# Patient Record
Sex: Male | Born: 1960 | Race: Black or African American | Hispanic: No | Marital: Married | Smoking: Never smoker
Health system: Southern US, Community
[De-identification: ages and names within clinical notes are randomized; demographics above are authoritative.]

## PROBLEM LIST (undated history)

## (undated) DIAGNOSIS — K209 Esophagitis, unspecified without bleeding: Secondary | ICD-10-CM

## (undated) DIAGNOSIS — K2970 Gastritis, unspecified, without bleeding: Secondary | ICD-10-CM

## (undated) DIAGNOSIS — K219 Gastro-esophageal reflux disease without esophagitis: Secondary | ICD-10-CM

## (undated) DIAGNOSIS — I10 Essential (primary) hypertension: Secondary | ICD-10-CM

## (undated) DIAGNOSIS — K648 Other hemorrhoids: Principal | ICD-10-CM

## (undated) DIAGNOSIS — T7840XA Allergy, unspecified, initial encounter: Secondary | ICD-10-CM

## (undated) DIAGNOSIS — K635 Polyp of colon: Secondary | ICD-10-CM

## (undated) HISTORY — DX: Esophagitis, unspecified: K20.9

## (undated) HISTORY — PX: ESOPHAGOGASTRODUODENOSCOPY: SHX1529

## (undated) HISTORY — DX: Esophagitis, unspecified without bleeding: K20.90

## (undated) HISTORY — PX: HEMORRHOID BANDING: SHX5850

## (undated) HISTORY — DX: Gastro-esophageal reflux disease without esophagitis: K21.9

## (undated) HISTORY — DX: Allergy, unspecified, initial encounter: T78.40XA

## (undated) HISTORY — DX: Gastritis, unspecified, without bleeding: K29.70

## (undated) HISTORY — PX: COLONOSCOPY: SHX5424

## (undated) HISTORY — DX: Other hemorrhoids: K64.8

## (undated) HISTORY — DX: Polyp of colon: K63.5

---

## 2005-08-05 ENCOUNTER — Emergency Department (HOSPITAL_COMMUNITY): Admission: EM | Admit: 2005-08-05 | Discharge: 2005-08-05 | Payer: Self-pay | Admitting: Emergency Medicine

## 2006-01-09 ENCOUNTER — Encounter: Admission: RE | Admit: 2006-01-09 | Discharge: 2006-01-09 | Payer: Self-pay | Admitting: Emergency Medicine

## 2007-06-03 HISTORY — PX: WISDOM TOOTH EXTRACTION: SHX21

## 2008-03-16 ENCOUNTER — Encounter: Admission: RE | Admit: 2008-03-16 | Discharge: 2008-03-16 | Payer: Self-pay | Admitting: Emergency Medicine

## 2009-10-16 ENCOUNTER — Emergency Department (HOSPITAL_COMMUNITY): Admission: EM | Admit: 2009-10-16 | Discharge: 2009-10-16 | Payer: Self-pay | Admitting: Emergency Medicine

## 2009-11-03 ENCOUNTER — Encounter: Admission: RE | Admit: 2009-11-03 | Discharge: 2009-11-03 | Payer: Self-pay | Admitting: Neurological Surgery

## 2010-03-28 ENCOUNTER — Encounter: Admission: RE | Admit: 2010-03-28 | Discharge: 2010-03-28 | Payer: Self-pay | Admitting: Internal Medicine

## 2012-02-19 ENCOUNTER — Emergency Department (INDEPENDENT_AMBULATORY_CARE_PROVIDER_SITE_OTHER)
Admission: EM | Admit: 2012-02-19 | Discharge: 2012-02-19 | Disposition: A | Discharge status: Home / Self Care | Payer: Self-pay | Source: Home / Self Care | Attending: Emergency Medicine | Admitting: Emergency Medicine

## 2012-02-19 ENCOUNTER — Encounter (HOSPITAL_COMMUNITY): Payer: Self-pay | Admitting: Emergency Medicine

## 2012-02-19 DIAGNOSIS — N481 Balanitis: Secondary | ICD-10-CM

## 2012-02-19 DIAGNOSIS — N476 Balanoposthitis: Secondary | ICD-10-CM

## 2012-02-19 HISTORY — DX: Essential (primary) hypertension: I10

## 2012-02-19 MED ORDER — CLOTRIMAZOLE 1 % EX CREA: TOPICAL_CREAM | CUTANEOUS | Status: DC

## 2012-02-19 NOTE — ED Notes (Addendum)
Pt c/o irritation in groin area since Tuesday. Pt states that he used a different show gel at the gym. Denies any other symptoms. Needs rx refill on acid reflux med, cant remember name of med.

## 2012-02-19 NOTE — ED Provider Notes (Signed)
History     CSN: [id number]  Arrival date & time 02/19/12  0915   None     Chief Complaint  Patient presents with  . Rash    groin irritation    (Consider location/radiation/quality/duration/timing/severity/associated sxs/prior treatment) The history is provided by the patient.   This patient complains of a pruritic rash.  Location: penis Onset: 2 days ago   Course: unchanged Self-treated with:  nothing     Improvement with treatment: n/a   History Itching: yes  Tenderness: no  New medications/antibiotics: no  Pet exposure: no  Recent travel or tropical exposure: no  New soaps, shampoos, detergent, clothing: no Tick/insect exposure: no   Red Flags Feeling ill: no Fever:no Facial/tongue swelling/difficulty breathing:  no  Diabetic or immunocompromised: no   Past Medical History  Diagnosis Date  . Hypertension     History reviewed. No pertinent past surgical history.  Family History  Problem Relation Age of Onset  . Hypertension Other     History  Substance Use Topics  . Smoking status: Never Smoker   . Smokeless tobacco: Not on file  . Alcohol Use: No      Review of Systems  Constitutional: Negative.   Respiratory: Negative.   Cardiovascular: Negative.   Genitourinary: Negative.   Skin: Positive for rash.    Allergies  Review of patient's allergies indicates no known allergies.  Home Medications   Current Outpatient Rx  Name Route Sig Dispense Refill  . HYDROCHLOROTHIAZIDE 12.5 MG PO CAPS Oral Take 12.5 mg by mouth daily.    Marland Kitchen CLOTRIMAZOLE 1 % EX CREA  Apply to affected area 2 times daily 30 g 0    BP 124/80  Pulse 81  Temp 98.1 F (36.7 C) (Oral)  Resp 16  SpO2 100%  Physical Exam  Nursing note and vitals reviewed. Constitutional: He is oriented to person, place, and time. Vital signs are normal. He appears well-developed and well-nourished. He is active and cooperative.  HENT:  Head: Normocephalic.  Eyes: Conjunctivae  normal are normal. Pupils are equal, round, and reactive to light. No scleral icterus.  Neck: Trachea normal. Neck supple.  Cardiovascular: Normal rate, regular rhythm and normal heart sounds.   Pulmonary/Chest: Effort normal and breath sounds normal.  Genitourinary:    Right testis shows no mass, no swelling and no tenderness. Right testis is descended. Cremasteric reflex is not absent on the right side. Left testis shows no mass, no swelling and no tenderness. Left testis is descended. Cremasteric reflex is not absent on the left side. Uncircumcised. Penile erythema present. No phimosis, paraphimosis, hypospadias or penile tenderness. No discharge found.       Erythematous rash to glans  Lymphadenopathy:       Right: No inguinal adenopathy present.       Left: No inguinal adenopathy present.  Neurological: He is alert and oriented to person, place, and time. No cranial nerve deficit or sensory deficit.  Skin: Skin is warm and dry.  Psychiatric: He has a normal mood and affect. His speech is normal and behavior is normal. Judgment and thought content normal. Cognition and memory are normal.    ED Course  Procedures (including critical care time)   Labs Reviewed  GC/CHLAMYDIA PROBE AMP, URINE   No results found.   1. Balanitis       MDM  Await GC results, absence of penile discharge, do not suspect infection.  Begin medications as prescribed.       Mario Coronado L  Dhruvan Gullion, NP 02/19/12 1051  Johnsie Kindred, NP 02/19/12 1053

## 2012-02-20 NOTE — ED Provider Notes (Signed)
Medical screening examination/treatment/procedure(s) were performed by non-physician practitioner and as supervising physician I was immediately available for consultation/collaboration.  Luiz Blare MD   Luiz Blare, MD 02/20/12 [phone]

## 2012-03-18 ENCOUNTER — Telehealth (HOSPITAL_COMMUNITY): Payer: Self-pay | Admitting: *Deleted

## 2012-03-18 NOTE — ED Notes (Signed)
Pt. called on VM 10/11 and asked for urine result from Sept. I called pt. back and told him Urine GC/Chlamydia was cancelled. I did not know why. I said they may not have received a sample.  Pt. denies any further symptoms. I told him if he does he needs to get rechecked. Pt. voiced understanding. Vassie Moselle 03/18/2012

## 2013-02-21 ENCOUNTER — Ambulatory Visit (INDEPENDENT_AMBULATORY_CARE_PROVIDER_SITE_OTHER): Payer: Managed Care, Other (non HMO) | Admitting: Physician Assistant

## 2013-02-21 ENCOUNTER — Other Ambulatory Visit: Payer: Self-pay | Admitting: Physician Assistant

## 2013-02-21 ENCOUNTER — Encounter: Payer: Self-pay | Admitting: Physician Assistant

## 2013-02-21 VITALS — BP 122/86 | HR 67 | Temp 98.8°F | Resp 16 | Ht 64.5 in | Wt 156.6 lb

## 2013-02-21 DIAGNOSIS — J9801 Acute bronchospasm: Secondary | ICD-10-CM

## 2013-02-21 DIAGNOSIS — I10 Essential (primary) hypertension: Secondary | ICD-10-CM

## 2013-02-21 DIAGNOSIS — Z0000 Encounter for general adult medical examination without abnormal findings: Secondary | ICD-10-CM

## 2013-02-21 DIAGNOSIS — R17 Unspecified jaundice: Secondary | ICD-10-CM

## 2013-02-21 DIAGNOSIS — M62838 Other muscle spasm: Secondary | ICD-10-CM

## 2013-02-21 DIAGNOSIS — K219 Gastro-esophageal reflux disease without esophagitis: Secondary | ICD-10-CM

## 2013-02-21 LAB — POCT URINALYSIS DIPSTICK
Bilirubin, UA: NEGATIVE
Blood, UA: NEGATIVE
Glucose, UA: NEGATIVE
Ketones, UA: NEGATIVE
Leukocytes, UA: NEGATIVE
Nitrite, UA: NEGATIVE
Protein, UA: NEGATIVE
Spec Grav, UA: <=1.005
Urobilinogen, UA: 0.2
pH, UA: 7

## 2013-02-21 LAB — POCT UA - MICROSCOPIC ONLY
Bacteria, U Microscopic: NEGATIVE
Casts, Ur, LPF, POC: NEGATIVE
Crystals, Ur, HPF, POC: NEGATIVE
RBC, urine, microscopic: NEGATIVE
WBC, Ur, HPF, POC: NEGATIVE
Yeast, UA: NEGATIVE

## 2013-02-21 LAB — CBC
HCT: 44.9 % (ref 39.0–52.0)
Hemoglobin: 15.5 g/dL (ref 13.0–17.0)
MCH: 30.3 pg (ref 26.0–34.0)
MCHC: 34.5 g/dL (ref 30.0–36.0)
MCV: 87.9 fL (ref 78.0–100.0)
Platelets: 204 10*3/uL (ref 150–400)
RBC: 5.11 MIL/uL (ref 4.22–5.81)
RDW: 14.3 % (ref 11.5–15.5)
WBC: 4.6 10*3/uL (ref 4.0–10.5)

## 2013-02-21 LAB — IFOBT (OCCULT BLOOD): IFOBT: NEGATIVE

## 2013-02-21 MED ORDER — CYCLOBENZAPRINE HCL 5 MG PO TABS: 5.0000 mg | ORAL_TABLET | Freq: Three times a day (TID) | ORAL | Status: DC | PRN

## 2013-02-21 MED ORDER — HYDROCHLOROTHIAZIDE 12.5 MG PO CAPS: 12.5000 mg | ORAL_CAPSULE | ORAL | Status: DC

## 2013-02-21 MED ORDER — ALBUTEROL SULFATE HFA 108 (90 BASE) MCG/ACT IN AERS: 2.0000 | INHALATION_SPRAY | RESPIRATORY_TRACT | Status: DC | PRN

## 2013-02-21 MED ORDER — OMEPRAZOLE 20 MG PO CPDR: 20.0000 mg | DELAYED_RELEASE_CAPSULE | Freq: Every day | ORAL | Status: DC

## 2013-02-21 NOTE — Progress Notes (Signed)
  Subjective:    Patient ID: Justin Farley, male    DOB: [birth year], 52 y.o.   MRN: [id number]  HPI    Review of Systems  Constitutional: Negative.   HENT: Negative.   Eyes: Negative.   Respiratory: Positive for cough and chest tightness.   Cardiovascular: Negative.   Gastrointestinal: Positive for abdominal pain.  Endocrine: Negative.   Genitourinary: Negative.   Musculoskeletal: Negative.   Skin: Negative.   Allergic/Immunologic: Negative.   Neurological: Negative.   Hematological: Negative.   Psychiatric/Behavioral: Negative.        Objective:   Physical Exam        Assessment & Plan:

## 2013-02-22 ENCOUNTER — Encounter: Payer: Self-pay | Admitting: Physician Assistant

## 2013-02-22 LAB — COMPREHENSIVE METABOLIC PANEL
ALT: 20 U/L (ref 0–53)
AST: 24 U/L (ref 0–37)
Albumin: 4.7 g/dL (ref 3.5–5.2)
Alkaline Phosphatase: 24 U/L — ABNORMAL LOW (ref 39–117)
BUN: 10 mg/dL (ref 6–23)
CO2: 28 mEq/L (ref 19–32)
Calcium: 9.6 mg/dL (ref 8.4–10.5)
Chloride: 103 mEq/L (ref 96–112)
Creat: 1.06 mg/dL (ref 0.50–1.35)
Glucose, Bld: 80 mg/dL (ref 70–99)
Potassium: 3.7 mEq/L (ref 3.5–5.3)
Sodium: 139 mEq/L (ref 135–145)
Total Bilirubin: 1.8 mg/dL — ABNORMAL HIGH (ref 0.3–1.2)
Total Protein: 7.8 g/dL (ref 6.0–8.3)

## 2013-02-22 LAB — LIPID PANEL
Cholesterol: 134 mg/dL (ref 0–200)
HDL: 58 mg/dL (ref >39)
LDL Cholesterol: 62 mg/dL (ref 0–99)
Total CHOL/HDL Ratio: 2.3 Ratio
Triglycerides: 72 mg/dL (ref <150)
VLDL: 14 mg/dL (ref 0–40)

## 2013-02-22 LAB — BILIRUBIN,DIRECT & INDIRECT (FRACTIONATED)
Bilirubin, Direct: 0.4 mg/dL — ABNORMAL HIGH (ref 0.0–0.3)
Indirect Bilirubin: 1.4 mg/dL — ABNORMAL HIGH (ref 0.0–0.9)

## 2013-02-22 LAB — TSH: TSH: 2.31 u[IU]/mL (ref 0.350–4.500)

## 2013-02-22 LAB — PSA: PSA: 2.25 ng/mL (ref <=4.00)

## 2013-02-22 NOTE — Progress Notes (Signed)
Patient ID: Justin Farley MRN: [id number], DOB: Oct 08, 1960 52 y.o. Date of Encounter: 02/22/2013, 6:50 PM  Primary Physician: No primary provider on file.  Chief Complaint: Physical (CPE)  HPI: 52 y.o. male with history noted below here for CPE. Doing well. Recently lost to follow up and ran out of his HCTZ 12.5 mg secondary to being laid off at his job. 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 He does note some days some airway irritation secondary to all of the powder in the warehouse. To date he has not been wearing a mask while at work, however he does plan to change this in the very near future. He complains of an occasional dry cough and sometimes some chest tightness if he has had a bad day around a lot of powder at work. He also notes having to constantly look up during his shift and this is creating a lot of tension along his neck and the lower portion of his head at times. He feels like his neck is having spasms at times.   He also notes a return of his reflux secondary to being out of his omeprazole. He would like to restart his omeprazole. No increased belching, flatus, diarrhea, or constipation.   Review of Systems: Consitutional: No fever, chills, fatigue, night sweats, lymphadenopathy, or weight changes. Eyes: No visual changes, eye redness, or discharge. ENT/Mouth: Ears: No otalgia, tinnitus, hearing loss, discharge. Nose: No congestion, rhinorrhea, sinus pain, or epistaxis. Throat: No sore throat, post nasal drip, or teeth pain. Cardiovascular: No CP, palpitations, diaphoresis, DOE, edema, orthopnea, PND. Respiratory: Positive for cough and chest tightness. No hemoptysis, SOB, or wheezing. Gastrointestinal: Positive for reflux. No anorexia, dysphagia, pain, nausea, vomiting, hematemesis, diarrhea, constipation, BRBPR, or melena. Genitourinary: No dysuria, frequency, urgency, hematuria, incontinence, nocturia, decreased urinary  stream, discharge, impotence, or testicular pain/masses. Musculoskeletal: No decreased ROM, myalgias, stiffness, joint swelling, or weakness. Skin: No rash, erythema, lesion changes, pain, warmth, jaundice, or pruritis. Neurological: Positive for headache. No dizziness, syncope, seizures, tremors, memory loss, coordination problems, or paresthesias. Psychological: No anxiety, depression, hallucinations, SI/HI. Endocrine: No fatigue, polydipsia, polyphagia, polyuria, or known diabetes.   Past Medical History  Diagnosis Date  . Hypertension   . Reflux      History reviewed. No pertinent past surgical history.  Home Meds:  Prior to Admission medications   Medication Sig Start Date End Date Taking? Authorizing Provider         clotrimazole (LOTRIMIN) 1 % cream Apply to affected area 2 times daily 02/19/12  No Johnsie Kindred, NP         hydrochlorothiazide (MICROZIDE) 12.5 MG capsule Take 1 capsule (12.5 mg total) by mouth every morning. 02/21/13  No Coulter Oldaker M Vignesh Willert, PA-C  omeprazole (PRILOSEC) 20 MG capsule Take 1 capsule (20 mg total) by mouth daily. 02/21/13  No Sondra Barges, PA-C    Allergies: No Known Allergies  History   Social History  . Marital Status: Single    Spouse Name: N/A    Number of Children: N/A  . Years of Education: N/A   Occupational History  . Estate agent    Social History Main Topics  . Smoking status: Never Smoker   . Smokeless tobacco: Not on file  . Alcohol Use: No  . Drug Use: No  . Sexual Activity: Yes    Birth Control/ Protection: Condom   Other Topics Concern  . Not on file  Social History Narrative   Single. Education: [employer]. Exercise: 3 days a week for 2 hours.    Family History  Problem Relation Age of Onset  . Hypertension Other   . Hypertension Mother     Physical Exam: Blood pressure 122/86, pulse 67, temperature 98.8 F (37.1 C), temperature source Oral, resp. rate 16, height 5' 4.5" (1.638 m), weight 156 lb 9.6 oz  (71.033 kg), SpO2 100.00%.  General: Well developed, well nourished, in no acute distress. HEENT: Normocephalic, atraumatic. Conjunctiva pink, sclera non-icteric. Pupils 2 mm constricting to 1 mm, round, regular, and equally reactive to light and accomodation. EOMI. Internal auditory canal clear. TMs with good cone of light and without pathology. Nasal mucosa pink. Nares are without discharge. No sinus tenderness. Oral mucosa pink. Dentition normal. Pharynx without exudate.   Neck: Supple. Trachea midline. No thyromegaly. Full ROM. No lymphadenopathy. Lungs: Clear to auscultation bilaterally without wheezes, rales, or rhonchi. Breathing is of normal effort and unlabored. Cardiovascular: RRR with S1 S2. No murmurs, rubs, or gallops appreciated. Distal pulses 2+ symmetrically. No carotid or abdominal bruits.  Abdomen: Soft, non-tender, non-distended with normoactive bowel sounds. No hepatosplenomegaly or masses. No rebound/guarding. No CVA tenderness. Without hernias.  Rectal: No external hemorrhoids or fissures. Rectal vault without masses. Prostate smooth, symmetrical, and without nodules or TTP.   Genitourinary: Circumcised male. No penile lesions. Testes descended bilaterally, and smooth without tenderness or masses.  Musculoskeletal: Full range of motion and 5/5 strength throughout. Without swelling, atrophy, tenderness, crepitus, or warmth. Extremities without clubbing, cyanosis, or edema. Calves supple. Skin: Warm and moist without erythema, ecchymosis, wounds, or rash. Neuro: A+Ox3. CN II-XII grossly intact. Moves all extremities spontaneously. Full sensation throughout. Normal gait. DTR 2+ throughout upper and lower extremities. Finger to nose intact. Psych:  Responds to questions appropriately with a normal affect.   Studies:  Results for orders placed in visit on 02/21/13  CBC      Result Value Range   WBC 4.6  4.0 - 10.5 K/uL   RBC 5.11  4.22 - 5.81 MIL/uL   Hemoglobin 15.5  13.0 - 17.0  g/dL   HCT 16.1  09.6 - 04.5 %   MCV 87.9  78.0 - 100.0 fL   MCH 30.3  26.0 - 34.0 pg   MCHC 34.5  30.0 - 36.0 g/dL   RDW 40.9  81.1 - 91.4 %   Platelets 204  150 - 400 K/uL  COMPREHENSIVE METABOLIC PANEL      Result Value Range   Sodium 139  135 - 145 mEq/L   Potassium 3.7  3.5 - 5.3 mEq/L   Chloride 103  96 - 112 mEq/L   CO2 28  19 - 32 mEq/L   Glucose, Bld 80  70 - 99 mg/dL   BUN 10  6 - 23 mg/dL   Creat 7.82  9.56 - 2.13 mg/dL   Total Bilirubin 1.8 (*) 0.3 - 1.2 mg/dL   Alkaline Phosphatase 24 (*) 39 - 117 U/L   AST 24  0 - 37 U/L   ALT 20  0 - 53 U/L   Total Protein 7.8  6.0 - 8.3 g/dL   Albumin 4.7  3.5 - 5.2 g/dL   Calcium 9.6  8.4 - 08.6 mg/dL  LIPID PANEL      Result Value Range   Cholesterol 134  0 - 200 mg/dL   Triglycerides 72  <578 mg/dL   HDL 58  >46 mg/dL   Total CHOL/HDL Ratio  2.3     VLDL 14  0 - 40 mg/dL   LDL Cholesterol 62  0 - 99 mg/dL  PSA      Result Value Range   PSA 2.25  <=4.00 ng/mL  TSH      Result Value Range   TSH 2.310  0.350 - 4.500 uIU/mL  POCT UA - MICROSCOPIC ONLY      Result Value Range   WBC, Ur, HPF, POC neg     RBC, urine, microscopic neg     Bacteria, U Microscopic neg     Mucus, UA trace     Epithelial cells, urine per micros 0-2     Crystals, Ur, HPF, POC neg     Casts, Ur, LPF, POC neg     Yeast, UA neg    POCT URINALYSIS DIPSTICK      Result Value Range   Color, UA yellow     Clarity, UA clear     Glucose, UA neg     Bilirubin, UA neg     Ketones, UA neg     Spec Grav, UA <=1.005     Blood, UA neg     pH, UA 7.0     Protein, UA neg     Urobilinogen, UA 0.2     Nitrite, UA neg     Leukocytes, UA Negative    IFOBT (OCCULT BLOOD)      Result Value Range   IFOBT Negative       CBC, CMET, Lipid, PSA, TSH all pending. Patient is not fasting.   Assessment/Plan:  52 y.o. male here for CPE with inhalation irritation, hypertension, and tension headache.  1) Inhalation irritation -Proventil 2 puffs inhaled q  4-6 hours prn #1 RF 2 -Wear a mask at work  2) Hypertension -Restart HCTZ 12.5 mg 1 po daily #30 RF 11 -Healthy diet and exercise  -Weight loss  3) Tension headache -Flexeril 5 mg 1 po tid #90 RF 3, only take at night time  4) CPE -Will schedule screening colonoscopy during the first half of October 2014 once he turns 46 -Healthy lifestyle choices -Anticipatory guidance   Signed, Eula Listen, PA-C 02/22/2013 6:50 PM

## 2013-03-02 ENCOUNTER — Other Ambulatory Visit: Payer: Self-pay | Admitting: Physician Assistant

## 2013-03-02 DIAGNOSIS — Z1211 Encounter for screening for malignant neoplasm of colon: Secondary | ICD-10-CM

## 2013-03-02 NOTE — Progress Notes (Signed)
Please schedule patient for routine screening colonoscopy after his 52th birthday on 03/11/13.

## 2013-03-10 ENCOUNTER — Encounter: Payer: Self-pay | Admitting: Gastroenterology

## 2013-03-14 ENCOUNTER — Ambulatory Visit (INDEPENDENT_AMBULATORY_CARE_PROVIDER_SITE_OTHER): Payer: Managed Care, Other (non HMO) | Admitting: Physician Assistant

## 2013-03-14 ENCOUNTER — Encounter: Payer: Self-pay | Admitting: Physician Assistant

## 2013-03-14 VITALS — BP 115/70 | HR 73 | Temp 98.7°F | Resp 16 | Ht 64.0 in | Wt 157.0 lb

## 2013-03-14 DIAGNOSIS — R17 Unspecified jaundice: Secondary | ICD-10-CM

## 2013-03-14 DIAGNOSIS — Z23 Encounter for immunization: Secondary | ICD-10-CM

## 2013-03-14 LAB — COMPREHENSIVE METABOLIC PANEL
ALT: 18 U/L (ref 0–53)
AST: 21 U/L (ref 0–37)
Albumin: 4.4 g/dL (ref 3.5–5.2)
Alkaline Phosphatase: 30 U/L — ABNORMAL LOW (ref 39–117)
BUN: 13 mg/dL (ref 6–23)
CO2: 29 mEq/L (ref 19–32)
Calcium: 9.5 mg/dL (ref 8.4–10.5)
Chloride: 104 mEq/L (ref 96–112)
Creat: 1.28 mg/dL (ref 0.50–1.35)
Glucose, Bld: 96 mg/dL (ref 70–99)
Potassium: 3.6 mEq/L (ref 3.5–5.3)
Sodium: 139 mEq/L (ref 135–145)
Total Bilirubin: 0.8 mg/dL (ref 0.3–1.2)
Total Protein: 7.3 g/dL (ref 6.0–8.3)

## 2013-03-14 LAB — BILIRUBIN,DIRECT & INDIRECT (FRACTIONATED)
Bilirubin, Direct: 0.2 mg/dL (ref 0.0–0.3)
Indirect Bilirubin: 0.6 mg/dL (ref 0.0–0.9)

## 2013-03-14 NOTE — Progress Notes (Signed)
Patient ID: Justin Farley MRN: [id number], DOB: 1961/01/07, 52 y.o. Date of Encounter: 03/14/2013, 11:26 AM  Primary Physician: No primary provider on file.  Chief Complaint: Follow up elevated bilirubin  HPI: 52 y.o. male with history below presents for follow up of elevated bilirubin. Patient initially seen on 02/21/13 for CPE and was found to have a total bilirubin of 1.8. Indirect was 1.4 while direct was 0.4. He does not have a history of of elevated bilirubin. He was asked to come in for repeat testing. Asymptomatic. He does not drink alcohol nor does he use Tylenol. He does drink energy drinks, Red Bull, specifically. He has stopped drinking them since our CPE. Urine is normal in color.    Past Medical History  Diagnosis Date  . Hypertension   . Reflux      Home Meds: Prior to Admission medications   Medication Sig Start Date End Date Taking? Authorizing Provider  albuterol (PROVENTIL HFA;VENTOLIN HFA) 108 (90 BASE) MCG/ACT inhaler Inhale 2 puffs into the lungs every 4 (four) hours as needed for wheezing. 02/21/13   Sondra Barges, PA-C  clotrimazole (LOTRIMIN) 1 % cream Apply to affected area 2 times daily 02/19/12   Johnsie Kindred, NP  cyclobenzaprine (FLEXERIL) 5 MG tablet Take 1 tablet (5 mg total) by mouth 3 (three) times daily as needed for muscle spasms. 02/21/13   Sondra Barges, PA-C  hydrochlorothiazide (MICROZIDE) 12.5 MG capsule Take 1 capsule (12.5 mg total) by mouth every morning. 02/21/13   Sondra Barges, PA-C  omeprazole (PRILOSEC) 20 MG capsule Take 1 capsule (20 mg total) by mouth daily. 02/21/13   Sondra Barges, PA-C    Allergies: No Known Allergies  History   Social History  . Marital Status: Single    Spouse Name: N/A    Number of Children: N/A  . Years of Education: N/A   Occupational History  . Estate agent    Social History Main Topics  . Smoking status: Never Smoker   . Smokeless tobacco: Not on file  . Alcohol Use: No  . Drug Use: No  .  Sexual Activity: Yes    Birth Control/ Protection: Condom   Other Topics Concern  . Not on file   Social History Narrative   Single. Education: [employer]. Exercise: 3 days a week for 2 hours.     Review of Systems: Constitutional: negative for chills, fever, or fatigue  HEENT: negative for vision changes or hearing loss Cardiovascular: negative for chest pain or palpitations Respiratory: negative for wheezing, shortness of breath, or cough Abdominal: negative for abdominal pain, nausea, vomiting, diarrhea, or constipation Dermatological: negative for rash Neurologic: negative for headache  Physical Exam: There were no vitals taken for this visit., There is no weight on file to calculate BMI. General: Well developed, well nourished, in no acute distress. Head: Normocephalic, atraumatic, eyes without discharge, sclera non-icteric, nares are without discharge.   Neck: Supple. Full ROM.  Lungs: Clear bilaterally to auscultation without wheezes, rales, or rhonchi. Breathing is unlabored. Heart: RRR with S1 S2. No murmurs, rubs, or gallops appreciated. Abdomen: Soft, non-tender, non-distended with normoactive bowel sounds. No hepatosplenomegaly. No rebound/guarding. No obvious abdominal masses. Msk:  Strength and tone normal for age. Extremities/Skin: Warm and dry. No clubbing or cyanosis. No edema. No rashes or suspicious lesions. Neuro: Alert and oriented X 3. Moves all extremities spontaneously. Gait is normal. CNII-XII grossly in tact. Psych:  Responds to questions appropriately with a normal affect.  Labs: CMP pending Direct and indirect bilirubin pending  ASSESSMENT AND PLAN:  52 y.o. male with elevated bilirubin -Await labs -Follow up pending -Colonoscopy is scheduled for December 2014   Signed, Eula Listen, [facility] [place], [place] [address] [phone] 03/14/2013 11:26 AM

## 2013-04-19 ENCOUNTER — Ambulatory Visit (INDEPENDENT_AMBULATORY_CARE_PROVIDER_SITE_OTHER): Payer: Managed Care, Other (non HMO) | Admitting: Physician Assistant

## 2013-04-19 VITALS — BP 104/74 | HR 70 | Temp 99.1°F | Resp 18 | Ht 64.5 in | Wt 156.4 lb

## 2013-04-19 DIAGNOSIS — K219 Gastro-esophageal reflux disease without esophagitis: Secondary | ICD-10-CM

## 2013-04-19 DIAGNOSIS — I10 Essential (primary) hypertension: Secondary | ICD-10-CM | POA: Insufficient documentation

## 2013-04-19 MED ORDER — OMEPRAZOLE 40 MG PO CPDR: 40.0000 mg | DELAYED_RELEASE_CAPSULE | Freq: Every day | ORAL | Status: DC

## 2013-04-19 MED ORDER — RANITIDINE HCL 300 MG PO TABS: 300.0000 mg | ORAL_TABLET | Freq: Every day | ORAL | Status: DC

## 2013-04-19 NOTE — Progress Notes (Signed)
.     Subjective:    Patient ID: Justin Farley, male    DOB: 1960/12/29, 52 y.o.   MRN: [id number]  HPI Pt presents to clinic with upper abdominal pain that has worsened over the last month - this feels like when he was put on Nexium.  He was changed to Prilosec due to insurance coverage but was doing well on Prilosec.  He knows that spicy foods make his pain worse.  He does not have nighttime pain.  He drinks coffee but that does not bother him much.  He takes no additional meds for his symptoms  Review of Systems  Gastrointestinal: Positive for abdominal pain. Negative for nausea, vomiting, diarrhea, constipation and blood in stool.       No increase in burping       Objective:   Physical Exam  Vitals reviewed. Constitutional: He is oriented to person, place, and time. He appears well-developed and well-nourished.  HENT:  Head: Normocephalic and atraumatic.  Right Ear: External ear normal.  Left Ear: External ear normal.  Eyes: Conjunctivae are normal.  Neck: Normal range of motion.  Cardiovascular: Normal rate, regular rhythm and normal heart sounds.   No murmur heard. Pulmonary/Chest: Effort normal and breath sounds normal.  Abdominal: Soft. There is no tenderness.  Neurological: He is alert and oriented to person, place, and time.  Skin: Skin is warm and dry.  Psychiatric: He has a normal mood and affect. His behavior is normal. Judgment and thought content normal.       Assessment & Plan:  GERD (gastroesophageal reflux disease) - Plan: omeprazole (PRILOSEC) 40 MG capsule, ranitidine (ZANTAC) 300 MG tablet, H. pylori antibody, IgG  If h pylori is + will treat accordingly - he will use the Zantac for 2 weeks to help calm down the irritation and then he will use prn after eating spicy foods.  Benny Lennert PA-C 04/19/2013 9:50 AM

## 2013-04-20 LAB — H. PYLORI ANTIBODY, IGG: H Pylori IgG: 0.47 {ISR}

## 2013-04-22 ENCOUNTER — Telehealth: Payer: Self-pay

## 2013-04-22 NOTE — Telephone Encounter (Signed)
Letter typed, printed, signed, and at the TL desk.

## 2013-04-22 NOTE — Telephone Encounter (Signed)
Patient of Eula Listen. Says he needs a clearance letter so he can work out. He has HTN and is on a low dose BP medicine and needs ok from PCP to work out at [employer]. CB# [phone]

## 2013-04-23 NOTE — Telephone Encounter (Signed)
Pt notified that letter is ready for pick up

## 2013-04-25 ENCOUNTER — Ambulatory Visit (AMBULATORY_SURGERY_CENTER): Payer: Self-pay | Admitting: *Deleted

## 2013-04-25 VITALS — Ht 64.0 in | Wt 161.0 lb

## 2013-04-25 DIAGNOSIS — Z1211 Encounter for screening for malignant neoplasm of colon: Secondary | ICD-10-CM

## 2013-04-25 MED ORDER — MOVIPREP 100 G PO SOLR: ORAL | Status: DC

## 2013-04-25 NOTE — Progress Notes (Signed)
No allergies to eggs or soy. No problems with anesthesia.  

## 2013-04-27 ENCOUNTER — Encounter: Payer: Self-pay | Admitting: Gastroenterology

## 2013-05-10 ENCOUNTER — Encounter: Payer: Self-pay | Admitting: Gastroenterology

## 2013-05-10 ENCOUNTER — Ambulatory Visit (AMBULATORY_SURGERY_CENTER): Payer: Managed Care, Other (non HMO) | Admitting: Gastroenterology

## 2013-05-10 VITALS — BP 113/71 | HR 83 | Temp 97.8°F | Resp 10 | Ht 64.0 in | Wt 161.0 lb

## 2013-05-10 DIAGNOSIS — Z1211 Encounter for screening for malignant neoplasm of colon: Secondary | ICD-10-CM

## 2013-05-10 MED ORDER — SODIUM CHLORIDE 0.9 % IV SOLN: 500.0000 mL | INTRAVENOUS | Status: DC

## 2013-05-10 NOTE — Op Note (Signed)
[facility] [address] [place], [address]   COLONOSCOPY PROCEDURE REPORT  PATIENT: Justin Farley, Justin Farley  MR#: [id number] BIRTHDATE: [birth year] , 52  yrs. old GENDER: Male ENDOSCOPIST: [name] Fee, MD REFERRED BY: Eula Listen, MD PROCEDURE DATE:  05/10/2013 PROCEDURE:   Colonoscopy, screening First Screening Colonoscopy - Avg.  risk and is 50 yrs.  old or older Yes.  Prior Negative Screening - Now for repeat screening. N/A  History of Adenoma - Now for follow-up colonoscopy & has been > or = to 3 yrs.  N/A  Polyps Removed Today? No.  Recommend repeat exam, <10 yrs? No. ASA CLASS:   Class II INDICATIONS:average risk screening. MEDICATIONS: Fentanyl 50 mcg IV, Versed 5 mg IV, and These medications were titrated to patient response per physician's verbal order  DESCRIPTION OF PROCEDURE:   After the risks benefits and alternatives of the procedure were thoroughly explained, informed consent was obtained.  A digital rectal exam revealed no abnormalities of the rectum.   The LB [id number]  endoscope was introduced through the anus and advanced to the cecum, which was identified by both the appendix and ileocecal valve. No adverse events experienced.   The quality of the prep was excellent.  The instrument was then slowly withdrawn as the colon was fully examined.    COLON FINDINGS: A normal appearing cecum, ileocecal valve, and appendiceal orifice were identified.  The ascending, hepatic flexure, transverse, splenic flexure, descending, sigmoid colon and rectum appeared unremarkable.  No polyps or cancers were seen. Retroflexed views revealed no abnormalities. The time to cecum=2 minutes 16 seconds.  Withdrawal time=11 minutes 09 seconds.  The scope was withdrawn and the procedure completed. COMPLICATIONS: There were no complications.  ENDOSCOPIC IMPRESSION: Normal colon No polyps or cancers  RECOMMENDATIONS: You should continue to follow  colorectal cancer screening guidelines for "routine risk" patients with a repeat colonoscopy in 10 years.    eSigned:  [name] Fee, MD 05/10/2013 10:13 AM

## 2013-05-10 NOTE — Progress Notes (Signed)
Patient did not have preoperative order for IV antibiotic SSI prophylaxis. (G8918)  Patient did not experience any of the following events: a burn prior to discharge; a fall within the facility; wrong site/side/patient/procedure/implant event; or a hospital transfer or hospital admission upon discharge from the facility. (G8907)  

## 2013-05-10 NOTE — Patient Instructions (Signed)
YOU HAD AN ENDOSCOPIC PROCEDURE TODAY AT THE [facility]: Refer to the procedure report that was given to you for any specific questions about what was found during the examination.  If the procedure report does not answer your questions, please call your gastroenterologist to clarify.  If you requested that your care partner not be given the details of your procedure findings, then the procedure report has been included in a sealed envelope for you to review at your convenience later.  YOU SHOULD EXPECT: Some feelings of bloating in the abdomen. Passage of more gas than usual.  Walking can help get rid of the air that was put into your GI tract during the procedure and reduce the bloating. If you had a lower endoscopy (such as a colonoscopy or flexible sigmoidoscopy) you may notice spotting of blood in your stool or on the toilet paper. If you underwent a bowel prep for your procedure, then you may not have a normal bowel movement for a few days.  DIET: Your first meal following the procedure should be a light meal and then it is ok to progress to your normal diet.  A half-sandwich or bowl of soup is an example of a good first meal.  Heavy or fried foods are harder to digest and may make you feel nauseous or bloated.  Likewise meals heavy in dairy and vegetables can cause extra gas to form and this can also increase the bloating.  Drink plenty of fluids but you should avoid alcoholic beverages for 24 hours.  ACTIVITY: Your care partner should take you home directly after the procedure.  You should plan to take it easy, moving slowly for the rest of the day.  You can resume normal activity the day after the procedure however you should NOT DRIVE or use heavy machinery for 24 hours (because of the sedation medicines used during the test).    SYMPTOMS TO REPORT IMMEDIATELY: A gastroenterologist can be reached at any hour.  During normal business hours, 8:30 AM to 5:00 PM Monday through Friday,  call [phone].  After hours and on weekends, please call the GI answering service at [phone] who will take a message and have the physician on call contact you.   Following lower endoscopy (colonoscopy or flexible sigmoidoscopy):  Excessive amounts of blood in the stool  Significant tenderness or worsening of abdominal pains  Swelling of the abdomen that is new, acute  Fever of 100F or higher  FOLLOW UP: If any biopsies were taken you will be contacted by phone or by letter within the next 1-3 weeks.  Call your gastroenterologist if you have not heard about the biopsies in 3 weeks.  Our staff will call the home number listed on your records the next business day following your procedure to check on you and address any questions or concerns that you may have at that time regarding the information given to you following your procedure. This is a courtesy call and so if there is no answer at the home number and we have not heard from you through the emergency physician on call, we will assume that you have returned to your regular daily activities without incident.  SIGNATURES/CONFIDENTIALITY: You and/or your care partner have signed paperwork which will be entered into your electronic medical record.  These signatures attest to the fact that that the information above on your After Visit Summary has been reviewed and is understood.  Full responsibility of the confidentiality of this   discharge information lies with you and/or your care-partner.  Recommendations See procedure report  

## 2013-05-11 ENCOUNTER — Telehealth: Payer: Self-pay

## 2013-05-11 NOTE — Telephone Encounter (Signed)
  Follow up Call-  Call back number 05/10/2013  Post procedure Call Back phone  # [phone]  Permission to leave phone message Yes     Patient questions:  Do you have a fever, pain , or abdominal swelling? no Pain Score  0 *  Have you tolerated food without any problems? yes  Have you been able to return to your normal activities? yes  Do you have any questions about your discharge instructions: Diet   no Medications  no Follow up visit  no                     Do you have questions or concerns about your Care? No   Actions: * If pain score is 4 or above: No action needed, pain <4.

## 2013-05-29 ENCOUNTER — Ambulatory Visit (INDEPENDENT_AMBULATORY_CARE_PROVIDER_SITE_OTHER): Payer: Managed Care, Other (non HMO) | Admitting: Internal Medicine

## 2013-05-29 VITALS — BP 100/80 | HR 71 | Temp 98.9°F | Resp 18 | Wt 158.0 lb

## 2013-05-29 DIAGNOSIS — R1013 Epigastric pain: Secondary | ICD-10-CM

## 2013-05-29 LAB — POCT CBC
Granulocyte percent: 67.9 %G (ref 37–80)
HCT, POC: 51.1 % (ref 43.5–53.7)
Hemoglobin: 15.9 g/dL (ref 14.1–18.1)
Lymph, poc: 1.7 (ref 0.6–3.4)
MCH, POC: 29.7 pg (ref 27–31.2)
MCHC: 31.1 g/dL — AB (ref 31.8–35.4)
MCV: 95.4 fL (ref 80–97)
MID (cbc): 0.3 (ref 0–0.9)
MPV: 10.8 fL (ref 0–99.8)
POC Granulocyte: 4.2 (ref 2–6.9)
POC LYMPH PERCENT: 27.5 %L (ref 10–50)
POC MID %: 4.6 %M (ref 0–12)
Platelet Count, POC: 207 10*3/uL (ref 142–424)
RBC: 5.36 M/uL (ref 4.69–6.13)
RDW, POC: 14.7 %
WBC: 6.2 10*3/uL (ref 4.6–10.2)

## 2013-05-29 LAB — COMPREHENSIVE METABOLIC PANEL
ALT: 25 U/L (ref 0–53)
AST: 25 U/L (ref 0–37)
Albumin: 4.9 g/dL (ref 3.5–5.2)
Alkaline Phosphatase: 31 U/L — ABNORMAL LOW (ref 39–117)
BUN: 10 mg/dL (ref 6–23)
CO2: 31 mEq/L (ref 19–32)
Calcium: 9.7 mg/dL (ref 8.4–10.5)
Chloride: 99 mEq/L (ref 96–112)
Creat: 1.15 mg/dL (ref 0.50–1.35)
Glucose, Bld: 86 mg/dL (ref 70–99)
Potassium: 3.9 mEq/L (ref 3.5–5.3)
Sodium: 139 mEq/L (ref 135–145)
Total Bilirubin: 1.3 mg/dL — ABNORMAL HIGH (ref 0.3–1.2)
Total Protein: 8 g/dL (ref 6.0–8.3)

## 2013-05-29 LAB — AMYLASE: Amylase: 86 U/L (ref 0–105)

## 2013-05-29 LAB — POCT SEDIMENTATION RATE: POCT SED RATE: 11 mm/hr (ref 0–22)

## 2013-05-29 NOTE — Progress Notes (Addendum)
Subjective:    Patient ID: Justin Farley, male    DOB: 15-Oct-1960, 52 y.o.   MRN: [id number]  HPI This chart was scribed for [facility] by Smiley Houseman, Scribe. This patient was seen in room 9 and the patient's care was started at 2:48 PM.  HPI Comments: Justin Farley is a 52 y.o. male with a h/o of GERD who presents to the [facility] complaining of constant, moderate, "squeezing" epigastric abdominal pain onset about a month ago.  Pt denies diarrhea, nausea, constipation, and any weight loss.  Pt states the pain sometimes subsides after eating.  Pt denies any burning sensation when drinking coffee.  Pt was taken Nexium, but stopped due to the loss of his job.  Pt is currently taking Zantac without relief.  Pt recently had a colonoscopy that was normal.  He stated concern about pancreatic cancer.  Pt denies drinking alcohol.      Past Surgical History  Procedure Laterality Date  . Wisdom tooth extraction  2009    Family History  Problem Relation Age of Onset  . Hypertension Other   . Hypertension Mother   . Colon cancer Neg Hx     History   Social History  . Marital Status: Single    Spouse Name: N/A    Number of Children: N/A  . Years of Education: N/A   Occupational History  . Estate agent    Social History Main Topics  . Smoking status: Never Smoker   . Smokeless tobacco: Never Used  . Alcohol Use: No  . Drug Use: No  . Sexual Activity: Yes    Birth Control/ Protection: Condom   Other Topics Concern  . Not on file   Social History Narrative   Single. Education: [employer]. Exercise: 3 days a week for 2 hours.    No Known Allergies  Patient Active Problem List   Diagnosis Date Noted  . HTN (hypertension) 04/19/2013  . GERD (gastroesophageal reflux disease) 04/19/2013    Results for orders placed in visit on 04/19/13  H. PYLORI ANTIBODY, IGG      Result Value Range   H Pylori IgG 0.47      Review of Systems    Constitutional: Negative for fever and chills.  HENT: Negative for congestion and rhinorrhea.   Respiratory: Negative for cough and shortness of breath.   Cardiovascular: Negative for chest pain.  Gastrointestinal: Positive for abdominal pain. Negative for nausea, vomiting, diarrhea and constipation.  Musculoskeletal: Negative for back pain.  Skin: Negative for color change and rash.  Neurological: Negative for syncope.       Objective:   Physical Exam  Nursing note and vitals reviewed. Constitutional: He is oriented to person, place, and time. He appears well-developed and well-nourished. No distress.  HENT:  Head: Normocephalic and atraumatic.  Eyes: Conjunctivae and EOM are normal. Right eye exhibits no discharge. Left eye exhibits no discharge.  Neck: Normal range of motion.  Cardiovascular: Normal rate and regular rhythm.   Pulmonary/Chest: Effort normal and breath sounds normal. No respiratory distress.  Abdominal: Soft. Bowel sounds are normal. He exhibits no distension and no mass. There is no rebound and no guarding.  Mild epigastr tend  Musculoskeletal: Normal range of motion.  Neurological: He is alert and oriented to person, place, and time.  Skin: Skin is warm and dry.  Psychiatric: He has a normal mood and affect. His behavior is normal.    Triage Vitals: BP  100/80  Pulse 71  Temp(Src) 98.9 F (37.2 C) (Oral)  Resp 18  Wt 158 lb (71.668 kg)  SpO2 99%  DIAGNOSTIC STUDIES: Oxygen Saturation is 99% on RA, normal by my interpretation.    COORDINATION OF CARE: 2:54 PM-Patient informed of current plan of treatment and evaluation and agrees with plan.   Results for orders placed in visit on 05/29/13  POCT CBC      Result Value Range   WBC 6.2  4.6 - 10.2 K/uL   Lymph, poc 1.7  0.6 - 3.4   POC LYMPH PERCENT 27.5  10 - 50 %L   MID (cbc) 0.3  0 - 0.9   POC MID % 4.6  0 - 12 %M   POC Granulocyte 4.2  2 - 6.9   Granulocyte percent 67.9  37 - 80 %G   RBC 5.36   4.69 - 6.13 M/uL   Hemoglobin 15.9  14.1 - 18.1 g/dL   HCT, POC 16.1  09.6 - 53.7 %   MCV 95.4  80 - 97 fL   MCH, POC 29.7  27 - 31.2 pg   MCHC 31.1 (*) 31.8 - 35.4 g/dL   RDW, POC 04.5     Platelet Count, POC 207  142 - 424 K/uL   MPV 10.8  0 - 99.8 fL       Assessment & Plan:  I have completed the patient encounter in its entirety as documented by the scribe, with editing by me where necessary. Robert P. Merla Riches, M.D. Abdominal pain, epigastric - Plan: POCT CBC, POCT SEDIMENTATION RATE, Comprehensive metabolic panel, HELICOBACTER PYLORI  ANTIBODY, IGM, Amylase  Cont PPI for now

## 2013-06-02 NOTE — Progress Notes (Signed)
Sent Referral for GI consult. Pt aware.

## 2013-06-02 NOTE — Addendum Note (Signed)
Addended by: Jethro Bolus A on: 06/02/2013 09:19 AM   Modules accepted: Orders

## 2013-06-03 LAB — HELICOBACTER PYLORI  ANTIBODY, IGM: Helicobacter pylori, IgM: 11.3 U/mL — ABNORMAL HIGH (ref <9.0)

## 2013-06-17 ENCOUNTER — Encounter: Payer: Self-pay | Admitting: Gastroenterology

## 2013-07-13 ENCOUNTER — Ambulatory Visit (INDEPENDENT_AMBULATORY_CARE_PROVIDER_SITE_OTHER): Payer: Managed Care, Other (non HMO) | Admitting: Gastroenterology

## 2013-07-13 ENCOUNTER — Encounter: Payer: Self-pay | Admitting: Gastroenterology

## 2013-07-13 VITALS — BP 110/80 | HR 88 | Ht 65.0 in | Wt 162.0 lb

## 2013-07-13 DIAGNOSIS — R1013 Epigastric pain: Secondary | ICD-10-CM

## 2013-07-13 DIAGNOSIS — G8929 Other chronic pain: Secondary | ICD-10-CM

## 2013-07-13 DIAGNOSIS — K219 Gastro-esophageal reflux disease without esophagitis: Secondary | ICD-10-CM

## 2013-07-13 NOTE — Progress Notes (Signed)
HPI: This is a  very pleasant 53 year old man whom I last saw him for a screening colonoscopy that was normal. There was about 5 or 6 months ago.  The past 3 or 4 months he has had epigastric burning. It might be worse with eating but sometimes he does not even have to eat for his epigastrium to hurt. He has no dysphasia. He does not take NSAIDs. His weight is up a bit. He does not have any overt GI bleeding. No associated nausea or vomiting. He was put on Nexium and H2 blocker was added a month or so ago and he has noticed some relief but not complete. He has never had imaging before.  This visit was made more difficult by the fact that our computer system was down during the visit and I had 0 excess to his medical records.      Review of systems: Pertinent positive and negative review of systems were noted in the above HPI section. Complete review of systems was performed and was otherwise normal.    Past Medical History  Diagnosis Date  . Hypertension   . GERD (gastroesophageal reflux disease)     Past Surgical History  Procedure Laterality Date  . Wisdom tooth extraction  2009    Current Outpatient Prescriptions  Medication Sig Dispense Refill  . albuterol (PROVENTIL HFA;VENTOLIN HFA) 108 (90 BASE) MCG/ACT inhaler Inhale 2 puffs into the lungs every 4 (four) hours as needed for wheezing.  1 Inhaler  2  . cyclobenzaprine (FLEXERIL) 5 MG tablet Take 1 tablet (5 mg total) by mouth 3 (three) times daily as needed for muscle spasms.  90 tablet  3  . hydrochlorothiazide (MICROZIDE) 12.5 MG capsule Take 1 capsule (12.5 mg total) by mouth every morning.  30 capsule  11  . omeprazole (PRILOSEC) 40 MG capsule Take 1 capsule (40 mg total) by mouth daily.  30 capsule  1  . ranitidine (ZANTAC) 300 MG tablet Take 1 tablet (300 mg total) by mouth at bedtime. For 2 weeks and then prn  30 tablet  0   No current facility-administered medications for this visit.    Allergies as of 07/13/2013   . (No Known Allergies)    Family History  Problem Relation Age of Onset  . Hypertension Other   . Hypertension Mother   . Colon cancer Neg Hx     History   Social History  . Marital Status: Single    Spouse Name: N/A    Number of Children: N/A  . Years of Education: N/A   Occupational History  . Freight forwarder    Social History Main Topics  . Smoking status: Never Smoker   . Smokeless tobacco: Never Used  . Alcohol Use: No  . Drug Use: No  . Sexual Activity: Yes    Birth Control/ Protection: Condom   Other Topics Concern  . Not on file   Social History Narrative   Single. Education: [employer]. Exercise: 3 days a week for 2 hours.       Physical Exam: BP 110/80  Pulse 88  Ht 5\' 5"  (1.651 m)  Wt 162 lb (73.483 kg)  BMI 26.96 kg/m2 Constitutional: generally well-appearing Psychiatric: alert and oriented x3 Eyes: extraocular movements intact Mouth: oral pharynx moist, no lesions Neck: supple no lymphadenopathy Cardiovascular: heart regular rate and rhythm Lungs: clear to auscultation bilaterally Abdomen: soft, nontender, nondistended, no obvious ascites, no peritoneal signs, normal bowel sounds Extremities: no lower extremity edema bilaterally Skin:  no lesions on visible extremities    Assessment and plan: 53 y.o. male with  dyspepsia, epigastric discomfort, GERD-like symptoms  I recommended we proceed with EGD at his soonest convenience and he is going to adjust his take his proton pump inhibitor so it is 20-30 minutes before his supper meal.

## 2013-07-29 ENCOUNTER — Ambulatory Visit (AMBULATORY_SURGERY_CENTER): Payer: Self-pay | Admitting: *Deleted

## 2013-07-29 VITALS — Ht 65.0 in | Wt 163.8 lb

## 2013-07-29 DIAGNOSIS — G8929 Other chronic pain: Secondary | ICD-10-CM

## 2013-07-29 DIAGNOSIS — K219 Gastro-esophageal reflux disease without esophagitis: Secondary | ICD-10-CM

## 2013-07-29 DIAGNOSIS — R1013 Epigastric pain: Secondary | ICD-10-CM

## 2013-07-29 NOTE — Progress Notes (Signed)
No allergies to eggs or soy. No problems with anesthesia.  

## 2013-08-12 ENCOUNTER — Ambulatory Visit (AMBULATORY_SURGERY_CENTER): Payer: Managed Care, Other (non HMO) | Admitting: Gastroenterology

## 2013-08-12 ENCOUNTER — Encounter: Payer: Self-pay | Admitting: Gastroenterology

## 2013-08-12 VITALS — BP 110/76 | HR 65 | Temp 97.5°F | Resp 20 | Ht 65.0 in | Wt 163.0 lb

## 2013-08-12 DIAGNOSIS — K319 Disease of stomach and duodenum, unspecified: Secondary | ICD-10-CM

## 2013-08-12 DIAGNOSIS — K209 Esophagitis, unspecified without bleeding: Secondary | ICD-10-CM

## 2013-08-12 DIAGNOSIS — K2990 Gastroduodenitis, unspecified, without bleeding: Secondary | ICD-10-CM

## 2013-08-12 DIAGNOSIS — R933 Abnormal findings on diagnostic imaging of other parts of digestive tract: Secondary | ICD-10-CM

## 2013-08-12 DIAGNOSIS — R1013 Epigastric pain: Secondary | ICD-10-CM

## 2013-08-12 DIAGNOSIS — K2970 Gastritis, unspecified, without bleeding: Secondary | ICD-10-CM

## 2013-08-12 MED ORDER — SODIUM CHLORIDE 0.9 % IV SOLN: 500.0000 mL | INTRAVENOUS | Status: DC

## 2013-08-12 NOTE — Progress Notes (Signed)
Called to room to assist during endoscopic procedure.  Patient ID and intended procedure confirmed with present staff. Received instructions for my participation in the procedure from the performing physician.  

## 2013-08-12 NOTE — Patient Instructions (Signed)
YOU HAD AN ENDOSCOPIC PROCEDURE TODAY AT [facility]: Refer to the procedure report that was given to you for any specific questions about what was found during the examination.  If the procedure report does not answer your questions, please call your gastroenterologist to clarify.  If you requested that your care partner not be given the details of your procedure findings, then the procedure report has been included in a sealed envelope for you to review at your convenience later.  YOU SHOULD EXPECT: Some feelings of bloating in the abdomen. Passage of more gas than usual.  Walking can help get rid of the air that was put into your GI tract during the procedure and reduce the bloating  DIET: Your first meal following the procedure should be a light meal and then it is ok to progress to your normal diet.  A half-sandwich or bowl of soup is an example of a good first meal.  Heavy or fried foods are harder to digest and may make you feel nauseous or bloated.  Likewise meals heavy in dairy and vegetables can cause extra gas to form and this can also increase the bloating.  Drink plenty of fluids but you should avoid alcoholic beverages for 24 hours.  ACTIVITY: Your care partner should take you home directly after the procedure.  You should plan to take it easy, moving slowly for the rest of the day.  You can resume normal activity the day after the procedure however you should NOT DRIVE or use heavy machinery for 24 hours (because of the sedation medicines used during the test).    SYMPTOMS TO REPORT IMMEDIATELY: A gastroenterologist can be reached at any hour.  During normal business hours, 8:30 AM to 5:00 PM Monday through Friday, call [phone].  After hours and on weekends, please call the GI answering service at [phone] who will take a message and have the physician on call contact you.   Following upper endoscopy (EGD)  Vomiting of blood or coffee ground material  New  chest pain or pain under the shoulder blades  Painful or persistently difficult swallowing  New shortness of breath  Fever of 100F or higher  Black, tarry-looking stools  FOLLOW UP: If any biopsies were taken you will be contacted by phone or by letter within the next 1-3 weeks.  Call your gastroenterologist if you have not heard about the biopsies in 3 weeks.  Our staff will call the home number listed on your records the next business day following your procedure to check on you and address any questions or concerns that you may have at that time regarding the information given to you following your procedure. This is a courtesy call and so if there is no answer at the home number and we have not heard from you through the emergency physician on call, we will assume that you have returned to your regular daily activities without incident.  SIGNATURES/CONFIDENTIALITY: You and/or your care partner have signed paperwork which will be entered into your electronic medical record.  These signatures attest to the fact that that the information above on your After Visit Summary has been reviewed and is understood.  Full responsibility of the confidentiality of this discharge information lies with you and/or your care-partner.  Await pathology  Continue your normal medications  Continue on your antacids

## 2013-08-12 NOTE — Progress Notes (Signed)
Procedure ends, to recovery, report given and VSS. 

## 2013-08-12 NOTE — Op Note (Signed)
[facility]  [address] [place], [address]   ENDOSCOPY PROCEDURE REPORT  PATIENT: Justin, Farley  MR#: [id number] BIRTHDATE: Sep 04, 1960 , 52  yrs. old GENDER: Male ENDOSCOPIST: Milus Banister, MD REFERRED BY:  Christell Faith, MD PROCEDURE DATE:  08/12/2013 PROCEDURE:  EGD w/ biopsy ASA CLASS:     Class II INDICATIONS:  burning epigastric pain. MEDICATIONS: MAC sedation, administered by CRNA and propofol (Diprivan) 200mg  IV TOPICAL ANESTHETIC: none  DESCRIPTION OF PROCEDURE: After the risks benefits and alternatives of the procedure were thoroughly explained, informed consent was obtained.  The LB [id number] endoscope was introduced through the mouth and advanced to the second portion of the duodenum. Without limitations.  The instrument was slowly withdrawn as the mucosa was fully examined.   There was mild reflux related esophagitis.  There was mild, distal non-specifid gastritis (biopsied).  There was a1cm, clefted submucosal lesion that did not appear neoplastic but was biopsied in tunnel fashion.  The examination was otherwise normal. Retroflexed views revealed no abnormalities.     The scope was then withdrawn from the patient and the procedure completed. COMPLICATIONS: There were no complications.  ENDOSCOPIC IMPRESSION: There was mild reflux related esophagitis.  There was mild, distal non-specifid gastritis (biopsied).  There was a 1cm, clefted submucosal lesion that did not appear neoplastic but was biopsied in tunnel fashion.  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 The examination was otherwise normal.  RECOMMENDATIONS: Continue daily antiacid medicine.  Await final pathology results.   eSigned:  Milus Banister, MD 08/12/2013 2:52 PM

## 2013-08-15 ENCOUNTER — Telehealth: Payer: Self-pay | Admitting: *Deleted

## 2013-08-15 NOTE — Telephone Encounter (Signed)
No answer, left message to call if questions or concerns. 

## 2013-08-19 ENCOUNTER — Encounter: Payer: Self-pay | Admitting: Gastroenterology

## 2013-12-11 ENCOUNTER — Ambulatory Visit (INDEPENDENT_AMBULATORY_CARE_PROVIDER_SITE_OTHER): Payer: Managed Care, Other (non HMO) | Admitting: Internal Medicine

## 2013-12-11 VITALS — BP 102/72 | HR 70 | Temp 97.8°F | Resp 12 | Ht 64.5 in | Wt 155.2 lb

## 2013-12-11 DIAGNOSIS — K2100 Gastro-esophageal reflux disease with esophagitis, without bleeding: Secondary | ICD-10-CM

## 2013-12-11 DIAGNOSIS — R1013 Epigastric pain: Secondary | ICD-10-CM

## 2013-12-11 MED ORDER — GI COCKTAIL ~~LOC~~
30.0000 mL | Freq: Once | ORAL | Status: AC
Start: 2013-12-11 — End: 2013-12-11
  Administered 2013-12-11: 30 mL via ORAL

## 2013-12-11 MED ORDER — AMOXICILL-CLARITHRO-LANSOPRAZ PO MISC: Freq: Two times a day (BID) | ORAL | Status: DC

## 2013-12-11 NOTE — Patient Instructions (Signed)
Gastroesophageal Reflux Disease, Adult Gastroesophageal reflux disease (GERD) happens when acid from your stomach flows up into the esophagus. When acid comes in contact with the esophagus, the acid causes soreness (inflammation) in the esophagus. Over time, GERD may create small holes (ulcers) in the lining of the esophagus. CAUSES   Increased body weight. This puts pressure on the stomach, making acid rise from the stomach into the esophagus.  Smoking. This increases acid production in the stomach.  Drinking alcohol. This causes decreased pressure in the lower esophageal sphincter (valve or ring of muscle between the esophagus and stomach), allowing acid from the stomach into the esophagus.  Late evening meals and a full stomach. This increases pressure and acid production in the stomach.  A malformed lower esophageal sphincter. Sometimes, no cause is found. SYMPTOMS   Burning pain in the lower part of the mid-chest behind the breastbone and in the mid-stomach area. This may occur twice a week or more often.  Trouble swallowing.  Sore throat.  Dry cough.  Asthma-like symptoms including chest tightness, shortness of breath, or wheezing. DIAGNOSIS  Your caregiver may be able to diagnose GERD based on your symptoms. In some cases, X-rays and other tests may be done to check for complications or to check the condition of your stomach and esophagus. TREATMENT  Your caregiver may recommend over-the-counter or prescription medicines to help decrease acid production. Ask your caregiver before starting or adding any new medicines.  HOME CARE INSTRUCTIONS   Change the factors that you can control. Ask your caregiver for guidance concerning weight loss, quitting smoking, and alcohol consumption.  Avoid foods and drinks that make your symptoms worse, such as:  Caffeine or alcoholic drinks.  Chocolate.  Peppermint or mint flavorings.  Garlic and onions.  Spicy foods.  Citrus fruits,  such as oranges, lemons, or limes.  Tomato-based foods such as sauce, chili, salsa, and pizza.  Fried and fatty foods.  Avoid lying down for the 3 hours prior to your bedtime or prior to taking a nap.  Eat small, frequent meals instead of large meals.  Wear loose-fitting clothing. Do not wear anything tight around your waist that causes pressure on your stomach.  Raise the head of your bed 6 to 8 inches with wood blocks to help you sleep. Extra pillows will not help.  Only take over-the-counter or prescription medicines for pain, discomfort, or fever as directed by your caregiver.  Do not take aspirin, ibuprofen, or other nonsteroidal anti-inflammatory drugs (NSAIDs). SEEK IMMEDIATE MEDICAL CARE IF:   You have pain in your arms, neck, jaw, teeth, or back.  Your pain increases or changes in intensity or duration.  You develop nausea, vomiting, or sweating (diaphoresis).  You develop shortness of breath, or you faint.  Your vomit is green, yellow, black, or looks like coffee grounds or blood.  Your stool is red, bloody, or black. These symptoms could be signs of other problems, such as heart disease, gastric bleeding, or esophageal bleeding. MAKE SURE YOU:   Understand these instructions.  Will watch your condition.  Will get help right away if you are not doing well or get worse. Document Released: 02/26/2005 Document Revised: 08/11/2011 Document Reviewed: 12/06/2010 Exit[employer] Patient Information 2015 [employer]. This information is not intended to replace advice given to you by your health care provider. Make sure you discuss any questions you have with your health care provider.  

## 2013-12-11 NOTE — Progress Notes (Signed)
   Subjective:    Patient ID: Justin Farley, male    DOB: [birth year], 53 y.o.   MRN: [id number]  HPI    Review of Systems     Objective:   Physical Exam        Assessment & Plan:

## 2013-12-11 NOTE — Progress Notes (Signed)
   Subjective:    Patient ID: Justin Farley, male    DOB: 13-Sep-1960, 53 y.o.   MRN: [id number]  HPI 53 year old male complains of epigastric pain. He has a history of GERD and is on omeprazole. He had an upper endoscopy but they found no ulcers only acid. It has been waking him up at night for the past couple of days. No vomiting,nausea or diarrhea. No weight loss or loss of appetite. No problems with swallowing or food going down. PPI is not working. He had H.pylori on blood test but negative bx. He was not treated for H.pylori. No blood seen   Review of Systems     Objective:   Physical Exam  Constitutional: He is oriented to person, place, and time. He appears well-developed and well-nourished.  HENT:  Head: Normocephalic.  Mouth/Throat: Oropharynx is clear and moist.  Eyes: EOM are normal. Pupils are equal, round, and reactive to light. No scleral icterus.  Neck: Normal range of motion.  Abdominal: He exhibits no distension and no mass. There is tenderness. There is no rebound and no guarding.  Neurological: He is alert and oriented to person, place, and time. He exhibits normal muscle tone. Coordination normal.  Psychiatric: He has a normal mood and affect.     GI cocktail relief of sxs.     Assessment & Plan:  GERD Untreated H.pylori Prev pak See Dr. Ardis Hughs again

## 2014-01-03 ENCOUNTER — Telehealth: Payer: Self-pay | Admitting: *Deleted

## 2014-01-03 NOTE — Telephone Encounter (Signed)
Pt called to tell Dr. Elder Cyphers that he took all of his amoxicillin-clarithromycin-lansoprazole [facility]) combo pack, but is still having stomach discomfort and needed to see what the next plan of action was.  Pt [phone]

## 2014-01-04 NOTE — Telephone Encounter (Signed)
Lm for pt  OV states pt needed to schedule an appt with Dr. Ardis Hughs for follow up on his abd pain. Pt may call back for another referral.

## 2014-01-05 ENCOUNTER — Telehealth: Payer: Self-pay

## 2014-01-05 DIAGNOSIS — R109 Unspecified abdominal pain: Secondary | ICD-10-CM

## 2014-01-05 NOTE — Telephone Encounter (Signed)
Pt is requesting a referral to dr Ardis Hughs he is still having stomach issues

## 2014-01-06 NOTE — Telephone Encounter (Signed)
Entered referral based on last OV.

## 2014-03-08 ENCOUNTER — Telehealth: Payer: Self-pay

## 2014-03-08 ENCOUNTER — Other Ambulatory Visit: Payer: Self-pay | Admitting: Physician Assistant

## 2014-03-08 NOTE — Telephone Encounter (Signed)
Pt states he is out of his Microzide 12.5 mgs. Please call [phone]    [facility] [address]

## 2014-03-09 NOTE — Telephone Encounter (Signed)
Refill was sent to pharmacy on 03/08/2014

## 2014-03-10 ENCOUNTER — Encounter: Payer: Self-pay | Admitting: Gastroenterology

## 2014-03-10 ENCOUNTER — Ambulatory Visit (INDEPENDENT_AMBULATORY_CARE_PROVIDER_SITE_OTHER): Payer: Managed Care, Other (non HMO) | Admitting: Gastroenterology

## 2014-03-10 VITALS — BP 112/60 | HR 76 | Ht 64.5 in | Wt 159.2 lb

## 2014-03-10 DIAGNOSIS — R1033 Periumbilical pain: Secondary | ICD-10-CM

## 2014-03-10 NOTE — Patient Instructions (Addendum)
You will be set up for an ultrasound to check for gallstones. If normal GB, then will advise you to increase omeprazole to twice daily.  You have been scheduled for an abdominal ultrasound at [facility] (1st floor of hospital) on 03/15/2014 at 8:30am. Please arrive 15 minutes prior to your appointment for registration. Make certain not to have anything to eat or drink 6 hours prior to your appointment. Should you need to reschedule your appointment, please contact radiology at [phone]. This test typically takes about 30 minutes to perform.

## 2014-03-10 NOTE — Progress Notes (Signed)
Review of pertinent gastrointestinal problems: 1. routine risk for colon cancer, colonoscopy Dr. Ardis Hughs December 2014 was normal. Recall colonoscopy at 10 year interval 2. dyspepsia, somewhat GERD-like. EGD Dr. 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  Has been feeling pretty well, until recently.  Saw Dr. Elder Cyphers recently who looked back at some labs, H. Pylori serology was + and put him on antibiotics.  Lately he's been having irritation,    Takes omeprazole daily, usually in AM with a meal.  Feels bloated when he eats, even with small meals.    Not really nauseas.  BMs do not cause or improve the pains.  Overall weight has been up.  Pretty uncommon NSAIDs.    Past Medical History  Diagnosis Date  . Hypertension   . GERD (gastroesophageal reflux disease)   . Allergy     Past Surgical History  Procedure Laterality Date  . Wisdom tooth extraction  2009    Current Outpatient Prescriptions  Medication Sig Dispense Refill  . albuterol (PROVENTIL HFA;VENTOLIN HFA) 108 (90 BASE) MCG/ACT inhaler Inhale 2 puffs into the lungs every 4 (four) hours as needed for wheezing.  1 Inhaler  2  . hydrochlorothiazide (MICROZIDE) 12.5 MG capsule Take 1 capsule every morning. NEED OFFICE VISIT FOR FURTHER REFILLS  30 capsule  0  . omeprazole (PRILOSEC) 40 MG capsule Take 1 capsule daily.NEED OFFICE VISIT FOR FURTHER REFILLS  30 capsule  0   No current facility-administered medications for this visit.    Allergies as of 03/10/2014  . (No Known Allergies)    Family History  Problem Relation Age of Onset  . Hypertension Other   . Hypertension Mother   . Colon cancer Neg Hx     History   Social History  . Marital Status: Single    Spouse Name: N/A    Number of Children: N/A  . Years of Education: N/A   Occupational History  .  Freight forwarder    Social History Main Topics  . Smoking status: Never Smoker   . Smokeless tobacco: Never Used  . Alcohol Use: No  . Drug Use: No  . Sexual Activity: Yes    Birth Control/ Protection: Condom   Other Topics Concern  . Not on file   Social History Narrative   Single. Education: [employer]. Exercise: 3 days a week for 2 hours.      Physical Exam: BP 112/60  Pulse 76  Ht 5' 4.5" (1.638 m)  Wt 159 lb 4 oz (72.235 kg)  BMI 26.92 kg/m2 Constitutional: generally well-appearing Psychiatric: alert and oriented x3 Abdomen: soft, nontender, nondistended, no obvious ascites, no peritoneal signs, normal bowel sounds     Assessment and plan: 53 y.o. male with bloating, some mild postprandial discomforts in epigastrium  Pretty vague symptoms here, unclear etiology. This may just be dyspepsia. Perhaps GERD related. He does have some postprandial epigastric discomforts are not check for gallstones on ultrasound. The ultrasound is normal we'll likely advised him to double his proton pump inhibitor to twice daily as a trial.

## 2014-03-15 ENCOUNTER — Ambulatory Visit (HOSPITAL_COMMUNITY)
Admission: RE | Admit: 2014-03-15 | Discharge: 2014-03-15 | Disposition: A | Discharge status: Home / Self Care | Payer: Managed Care, Other (non HMO) | Source: Ambulatory Visit | Attending: Gastroenterology | Admitting: Gastroenterology

## 2014-03-15 DIAGNOSIS — R1033 Periumbilical pain: Secondary | ICD-10-CM

## 2014-04-03 ENCOUNTER — Telehealth: Payer: Self-pay | Admitting: *Deleted

## 2014-04-03 ENCOUNTER — Other Ambulatory Visit: Payer: Self-pay | Admitting: Physician Assistant

## 2014-04-03 NOTE — Telephone Encounter (Signed)
LEFT PATIENT A MESSAGE TO SCHEDULE A FOLLOW UP APPPOINTMENT

## 2014-04-04 ENCOUNTER — Telehealth: Payer: Self-pay | Admitting: *Deleted

## 2014-04-04 NOTE — Telephone Encounter (Signed)
PT IS RETURNING OUR CALL TO MAKE A FOLLOW UP APPT. HE IS NOT SURE WHO HE NEEDS TO SEE OR WHEN HE NEEDS TO SCHEDULE THE APPT. FOR.  [place] [phone]

## 2014-04-05 NOTE — Telephone Encounter (Signed)
Pt transferred to billing to schedule an appt with the next available provider.

## 2014-04-10 ENCOUNTER — Ambulatory Visit (INDEPENDENT_AMBULATORY_CARE_PROVIDER_SITE_OTHER): Payer: Managed Care, Other (non HMO) | Admitting: Family Medicine

## 2014-04-10 ENCOUNTER — Encounter: Payer: Self-pay | Admitting: Family Medicine

## 2014-04-10 VITALS — BP 128/82 | HR 77 | Temp 98.3°F | Resp 16 | Ht 64.5 in | Wt 157.6 lb

## 2014-04-10 DIAGNOSIS — I10 Essential (primary) hypertension: Secondary | ICD-10-CM

## 2014-04-10 MED ORDER — HYDROCHLOROTHIAZIDE 12.5 MG PO CAPS: ORAL_CAPSULE | ORAL | Status: DC

## 2014-04-10 NOTE — Progress Notes (Signed)
   Subjective:    Patient ID: Justin Farley, male    DOB: [birth year], 53 y.o.   MRN: [id number]  HPI Patient presents today to establish care since he previously saw Christell Faith, PA-C for his HTN.   He was recently seen by GI and had workup of abdominal pain. He had a negative colonoscopy 12/14 and normal upper endoscopy 03/15, and abdominal US that showed a small amount of GB sludge, but no biliary dilation or acute cholecystitis. Since he increased his omeprazole to BID, he has done much better. He has been able to identify spicy food as a trigger to his pain and by decreasing his intake, has lessened his pain.   HTN- he has been on HCTZ 12.5 mg and tolerating well. He exercises regularly.   Review of Systems No chest pain, no SOB, no pedal edema.     Objective:   Physical Exam  Constitutional: He is oriented to person, place, and time. He appears well-developed and well-nourished.  HENT:  Head: Normocephalic and atraumatic.  Right Ear: External ear normal.  Left Ear: External ear normal.  Nose: Nose normal.  Mouth/Throat: Oropharynx is clear and moist. No oropharyngeal exudate.  Eyes: Conjunctivae are normal. Pupils are equal, round, and reactive to light.  Neck: Normal range of motion. Neck supple.  Cardiovascular: Normal rate, regular rhythm and normal heart sounds.   Pulmonary/Chest: Effort normal and breath sounds normal.  Musculoskeletal: Normal range of motion. He exhibits no edema.  Lymphadenopathy:    He has no cervical adenopathy.  Neurological: He is alert and oriented to person, place, and time.  Skin: Skin is warm and dry.  Psychiatric: He has a normal mood and affect. His behavior is normal. Judgment and thought content normal.  Vitals reviewed.  BP 128/82 mmHg  Pulse 77  Temp(Src) 98.3 F (36.8 C) (Oral)  Resp 16  Ht 5' 4.5" (1.638 m)  Wt 157 lb 9.6 oz (71.487 kg)  BMI 26.64 kg/m2  SpO2 99%     Assessment & Plan:  1. Essential hypertension -  hydrochlorothiazide (MICROZIDE) 12.5 MG capsule; TAKE 1 CAPSULE EVERY MORNING. Dispense: 90 capsule; Refill: 2 - follow up in 6 months for [place], FNP-BC  [facility] [place]  04/10/2014 4:14 PM

## 2014-06-12 ENCOUNTER — Other Ambulatory Visit: Payer: Self-pay | Admitting: Physician Assistant

## 2014-08-14 ENCOUNTER — Other Ambulatory Visit: Payer: Self-pay | Admitting: Physician Assistant

## 2014-09-13 ENCOUNTER — Ambulatory Visit (INDEPENDENT_AMBULATORY_CARE_PROVIDER_SITE_OTHER): Payer: 59 | Admitting: Family Medicine

## 2014-09-13 VITALS — BP 116/70 | HR 85 | Temp 98.3°F | Resp 18 | Ht 65.0 in | Wt 156.0 lb

## 2014-09-13 DIAGNOSIS — R05 Cough: Secondary | ICD-10-CM | POA: Diagnosis not present

## 2014-09-13 DIAGNOSIS — J069 Acute upper respiratory infection, unspecified: Secondary | ICD-10-CM | POA: Diagnosis not present

## 2014-09-13 DIAGNOSIS — R059 Cough, unspecified: Secondary | ICD-10-CM

## 2014-09-13 MED ORDER — HYDROCODONE-HOMATROPINE 5-1.5 MG/5ML PO SYRP: ORAL_SOLUTION | ORAL | Status: DC

## 2014-09-13 NOTE — Patient Instructions (Signed)
Saline nasal spray atleast 4 times per day if needed for nasal congestion, over the counter mucinex or mucinex DM for cough during day, hydrocodone at night if needed (as long as you are not wheezing - -use albuterol if wheezing). Drink plenty of fluids.  Return to the clinic or go to the nearest emergency room if any of your symptoms worsen or new symptoms occur.  Upper Respiratory Infection, Adult An upper respiratory infection (URI) is also sometimes known as the common cold. The upper respiratory tract includes the nose, sinuses, throat, trachea, and bronchi. Bronchi are the airways leading to the lungs. Most people improve within 1 week, but symptoms can last up to 2 weeks. A residual cough may last even longer.  CAUSES Many different viruses can infect the tissues lining the upper respiratory tract. The tissues become irritated and inflamed and often become very moist. Mucus production is also common. A cold is contagious. You can easily spread the virus to others by oral contact. This includes kissing, sharing a glass, coughing, or sneezing. Touching your mouth or nose and then touching a surface, which is then touched by another person, can also spread the virus. SYMPTOMS  Symptoms typically develop 1 to 3 days after you come in contact with a cold virus. Symptoms vary from person to person. They may include:  Runny nose.  Sneezing.  Nasal congestion.  Sinus irritation.  Sore throat.  Loss of voice (laryngitis).  Cough.  Fatigue.  Muscle aches.  Loss of appetite.  Headache.  Low-grade fever. DIAGNOSIS  You might diagnose your own cold based on familiar symptoms, since most people get a cold 2 to 3 times a year. Your caregiver can confirm this based on your exam. Most importantly, your caregiver can check that your symptoms are not due to another disease such as strep throat, sinusitis, pneumonia, asthma, or epiglottitis. Blood tests, throat tests, and X-rays are not necessary  to diagnose a common cold, but they may sometimes be helpful in excluding other more serious diseases. Your caregiver will decide if any further tests are required. RISKS AND COMPLICATIONS  You may be at risk for a more severe case of the common cold if you smoke cigarettes, have chronic heart disease (such as heart failure) or lung disease (such as asthma), or if you have a weakened immune system. The very young and very old are also at risk for more serious infections. Bacterial sinusitis, middle ear infections, and bacterial pneumonia can complicate the common cold. The common cold can worsen asthma and chronic obstructive pulmonary disease (COPD). Sometimes, these complications can require emergency medical care and may be life-threatening. PREVENTION  The best way to protect against getting a cold is to practice good hygiene. Avoid oral or hand contact with people with cold symptoms. Wash your hands often if contact occurs. There is no clear evidence that vitamin C, vitamin E, echinacea, or exercise reduces the chance of developing a cold. However, it is always recommended to get plenty of rest and practice good nutrition. TREATMENT  Treatment is directed at relieving symptoms. There is no cure. Antibiotics are not effective, because the infection is caused by a virus, not by bacteria. Treatment may include:  Increased fluid intake. Sports drinks offer valuable electrolytes, sugars, and fluids.  Breathing heated mist or steam (vaporizer or shower).  Eating chicken soup or other clear broths, and maintaining good nutrition.  Getting plenty of rest.  Using gargles or lozenges for comfort.  Controlling fevers with ibuprofen  or acetaminophen as directed by your caregiver.  Increasing usage of your inhaler if you have asthma. Zinc gel and zinc lozenges, taken in the first 24 hours of the common cold, can shorten the duration and lessen the severity of symptoms. Pain medicines may help with  fever, muscle aches, and throat pain. A variety of non-prescription medicines are available to treat congestion and runny nose. Your caregiver can make recommendations and may suggest nasal or lung inhalers for other symptoms.  HOME CARE INSTRUCTIONS   Only take over-the-counter or prescription medicines for pain, discomfort, or fever as directed by your caregiver.  Use a warm mist humidifier or inhale steam from a shower to increase air moisture. This may keep secretions moist and make it easier to breathe.  Drink enough water and fluids to keep your urine clear or pale yellow.  Rest as needed.  Return to work when your temperature has returned to normal or as your caregiver advises. You may need to stay home longer to avoid infecting others. You can also use a face mask and careful hand washing to prevent spread of the virus. SEEK MEDICAL CARE IF:   After the first few days, you feel you are getting worse rather than better.  You need your caregiver's advice about medicines to control symptoms.  You develop chills, worsening shortness of breath, or brown or red sputum. These may be signs of pneumonia.  You develop yellow or brown nasal discharge or pain in the face, especially when you bend forward. These may be signs of sinusitis.  You develop a fever, swollen neck glands, pain with swallowing, or white areas in the back of your throat. These may be signs of strep throat. SEEK IMMEDIATE MEDICAL CARE IF:   You have a fever.  You develop severe or persistent headache, ear pain, sinus pain, or chest pain.  You develop wheezing, a prolonged cough, cough up blood, or have a change in your usual mucus (if you have chronic lung disease).  You develop sore muscles or a stiff neck. Document Released: 11/12/2000 Document Revised: 08/11/2011 Document Reviewed: 08/24/2013 [facility] Patient Information 2015 [place], [place]. This information is not intended to replace advice given to you by your  health care provider. Make sure you discuss any questions you have with your health care provider.

## 2014-09-13 NOTE — Progress Notes (Signed)
Subjective:    Patient ID: Justin Farley, male    DOB: [birth year], 54 y.o.   MRN: [id number]  HPI Chief Complaint  Patient presents with  . Sore Throat    x1 week   . Cough    no mucous     This chart was scribed for [name], MD by Thea Alken, ED Scribe. This patient was seen in room 4 and the patient's care was started at 2:31 PM.  HPI Comments: Justin Farley is a 54 y.o. male who presents to the [facility] complaining of cough and sore throat began 3 days ago. Pt report he developed a cold about 5-6 days ago followed by a cough and sore throat with pain with swallowing 3 days ago. He has felt feverish but denies taking his temperature. Pt has tried nyquil.  Pt states he thought symptoms were improving but cough worsened last night while sleeping. Pt states he feels better today. Pt denies wheezing with symptoms and having to use albuterol inhaler which he uses as needed. Pt denies being a smoker. Pt works at [employer].   Patient Active Problem List   Diagnosis Date Noted  . HTN (hypertension) 04/19/2013  . GERD (gastroesophageal reflux disease) 04/19/2013   Past Medical History  Diagnosis Date  . Hypertension   . GERD (gastroesophageal reflux disease)   . Allergy    Past Surgical History  Procedure Laterality Date  . Wisdom tooth extraction  2009   No Known Allergies Prior to Admission medications   Medication Sig Start Date End Date Taking? Authorizing Provider  albuterol (PROVENTIL HFA;VENTOLIN HFA) 108 (90 BASE) MCG/ACT inhaler Inhale 2 puffs into the lungs every 4 (four) hours as needed for wheezing. 02/21/13  Yes Ryan M Dunn, PA-C  hydrochlorothiazide (MICROZIDE) 12.5 MG capsule TAKE 1 CAPSULE EVERY MORNING.  FOLLOW NEEDED FOR ADDITIONAL REFILLS 2ND. 04/10/14  Yes Elby Beck, FNP  omeprazole (PRILOSEC) 40 MG capsule Take 1 capsule (40 mg total) by mouth daily. 08/15/14  Yes Elby Beck, FNP   History   Social History  .  Marital Status: Single    Spouse Name: N/A  . Number of Children: N/A  . Years of Education: N/A   Occupational History  . Freight forwarder    Social History Main Topics  . Smoking status: Never Smoker   . Smokeless tobacco: Never Used  . Alcohol Use: No  . Drug Use: No  . Sexual Activity: Yes    Birth Control/ Protection: Condom   Other Topics Concern  . Not on file   Social History Narrative   Single. Education: [employer]. Exercise: 3 days a week for 2 hours.   Review of Systems  HENT: Positive for congestion and sore throat.   Respiratory: Positive for cough. Negative for wheezing.        Objective:   Physical Exam  Constitutional: He is oriented to person, place, and time. He appears well-developed and well-nourished.  HENT:  Head: Normocephalic and atraumatic.  Right Ear: Tympanic membrane, external ear and ear canal normal.  Left Ear: Tympanic membrane, external ear and ear canal normal.  Nose: No rhinorrhea.  Mouth/Throat: Oropharynx is clear and moist and mucous membranes are normal. No oropharyngeal exudate or posterior oropharyngeal erythema.  Eyes: Conjunctivae are normal. Pupils are equal, round, and reactive to light.  Neck: Neck supple.  Cardiovascular: Normal rate, regular rhythm, normal heart sounds and intact distal pulses.   No murmur heard.  Pulmonary/Chest: Effort normal and breath sounds normal. He has no wheezes. He has no rhonchi. He has no rales.  Abdominal: Soft. There is no tenderness.  Lymphadenopathy:    He has no cervical adenopathy.  Neurological: He is alert and oriented to person, place, and time.  Skin: Skin is warm and dry. No rash noted.  Psychiatric: He has a normal mood and affect. His behavior is normal.  Vitals reviewed.  Filed Vitals:   09/13/14 1424  BP: 116/70  Pulse: 85  Temp: 98.3 F (36.8 C)  TempSrc: Oral  Resp: 18  Height: 5\' 5"  (1.651 m)  Weight: 156 lb (70.761 kg)  SpO2: 98%    Assessment & Plan:    Justin Farley is a 54 y.o. male Cough - Plan: HYDROcodone-homatropine (HYCODAN) 5-1.5 MG/5ML syrup  Acute upper respiratory infection  - suspected viral URI. Reassuring exam, vitals.  Hx of reactive airway, but no wheeze with this illness.   -sx care with mucinex, fluids, rest, hycodan at night if needed - SED.   -rtc precautions discussed.     Meds ordered this encounter  Medications  . HYDROcodone-homatropine (HYCODAN) 5-1.5 MG/5ML syrup    Sig: 42m by mouth a bedtime as needed for cough.    Dispense:  120 mL    Refill:  0   Patient Instructions  Saline nasal spray atleast 4 times per day if needed for nasal congestion, over the counter mucinex or mucinex DM for cough during day, hydrocodone at night if needed (as long as you are not wheezing - -use albuterol if wheezing). Drink plenty of fluids.  Return to the clinic or go to the nearest emergency room if any of your symptoms worsen or new symptoms occur.  Upper Respiratory Infection, Adult An upper respiratory infection (URI) is also sometimes known as the common cold. The upper respiratory tract includes the nose, sinuses, throat, trachea, and bronchi. Bronchi are the airways leading to the lungs. Most people improve within 1 week, but symptoms can last up to 2 weeks. A residual cough may last even longer.  CAUSES Many different viruses can infect the tissues lining the upper respiratory tract. The tissues become irritated and inflamed and often become very moist. Mucus production is also common. A cold is contagious. You can easily spread the virus to others by oral contact. This includes kissing, sharing a glass, coughing, or sneezing. Touching your mouth or nose and then touching a surface, which is then touched by another person, can also spread the virus. SYMPTOMS  Symptoms typically develop 1 to 3 days after you come in contact with a cold virus. Symptoms vary from person to person. They may include:  Runny  nose.  Sneezing.  Nasal congestion.  Sinus irritation.  Sore throat.  Loss of voice (laryngitis).  Cough.  Fatigue.  Muscle aches.  Loss of appetite.  Headache.  Low-grade fever. DIAGNOSIS  You might diagnose your own cold based on familiar symptoms, since most people get a cold 2 to 3 times a year. Your caregiver can confirm this based on your exam. Most importantly, your caregiver can check that your symptoms are not due to another disease such as strep throat, sinusitis, pneumonia, asthma, or epiglottitis. Blood tests, throat tests, and X-rays are not necessary to diagnose a common cold, but they may sometimes be helpful in excluding other more serious diseases. Your caregiver will decide if any further tests are required. RISKS AND COMPLICATIONS  You may be at risk for a  more severe case of the common cold if you smoke cigarettes, have chronic heart disease (such as heart failure) or lung disease (such as asthma), or if you have a weakened immune system. The very young and very old are also at risk for more serious infections. Bacterial sinusitis, middle ear infections, and bacterial pneumonia can complicate the common cold. The common cold can worsen asthma and chronic obstructive pulmonary disease (COPD). Sometimes, these complications can require emergency medical care and may be life-threatening. PREVENTION  The best way to protect against getting a cold is to practice good hygiene. Avoid oral or hand contact with people with cold symptoms. Wash your hands often if contact occurs. There is no clear evidence that vitamin C, vitamin E, echinacea, or exercise reduces the chance of developing a cold. However, it is always recommended to get plenty of rest and practice good nutrition. TREATMENT  Treatment is directed at relieving symptoms. There is no cure. Antibiotics are not effective, because the infection is caused by a virus, not by bacteria. Treatment may include:  Increased  fluid intake. Sports drinks offer valuable electrolytes, sugars, and fluids.  Breathing heated mist or steam (vaporizer or shower).  Eating chicken soup or other clear broths, and maintaining good nutrition.  Getting plenty of rest.  Using gargles or lozenges for comfort.  Controlling fevers with ibuprofen or acetaminophen as directed by your caregiver.  Increasing usage of your inhaler if you have asthma. Zinc gel and zinc lozenges, taken in the first 24 hours of the common cold, can shorten the duration and lessen the severity of symptoms. Pain medicines may help with fever, muscle aches, and throat pain. A variety of non-prescription medicines are available to treat congestion and runny nose. Your caregiver can make recommendations and may suggest nasal or lung inhalers for other symptoms.  HOME CARE INSTRUCTIONS   Only take over-the-counter or prescription medicines for pain, discomfort, or fever as directed by your caregiver.  Use a warm mist humidifier or inhale steam from a shower to increase air moisture. This may keep secretions moist and make it easier to breathe.  Drink enough water and fluids to keep your urine clear or pale yellow.  Rest as needed.  Return to work when your temperature has returned to normal or as your caregiver advises. You may need to stay home longer to avoid infecting others. You can also use a face mask and careful hand washing to prevent spread of the virus. SEEK MEDICAL CARE IF:   After the first few days, you feel you are getting worse rather than better.  You need your caregiver's advice about medicines to control symptoms.  You develop chills, worsening shortness of breath, or brown or red sputum. These may be signs of pneumonia.  You develop yellow or brown nasal discharge or pain in the face, especially when you bend forward. These may be signs of sinusitis.  You develop a fever, swollen neck glands, pain with swallowing, or white areas in  the back of your throat. These may be signs of strep throat. SEEK IMMEDIATE MEDICAL CARE IF:   You have a fever.  You develop severe or persistent headache, ear pain, sinus pain, or chest pain.  You develop wheezing, a prolonged cough, cough up blood, or have a change in your usual mucus (if you have chronic lung disease).  You develop sore muscles or a stiff neck. Document Released: 11/12/2000 Document Revised: 08/11/2011 Document Reviewed: 08/24/2013 [facility] Patient Information 2015 [place], [place]. This  information is not intended to replace advice given to you by your health care provider. Make sure you discuss any questions you have with your health care provider.

## 2014-10-10 ENCOUNTER — Encounter: Payer: Managed Care, Other (non HMO) | Admitting: Family Medicine

## 2014-10-23 ENCOUNTER — Encounter: Payer: Self-pay | Admitting: Family Medicine

## 2014-10-23 ENCOUNTER — Ambulatory Visit (INDEPENDENT_AMBULATORY_CARE_PROVIDER_SITE_OTHER): Payer: 59 | Admitting: Family Medicine

## 2014-10-23 VITALS — BP 121/82 | HR 70 | Temp 98.4°F | Resp 16 | Ht 64.5 in | Wt 158.4 lb

## 2014-10-23 DIAGNOSIS — K648 Other hemorrhoids: Secondary | ICD-10-CM | POA: Diagnosis not present

## 2014-10-23 DIAGNOSIS — K644 Residual hemorrhoidal skin tags: Secondary | ICD-10-CM

## 2014-10-23 MED ORDER — HYDROCORTISONE ACETATE 25 MG RE SUPP: 25.0000 mg | Freq: Two times a day (BID) | RECTAL | Status: DC | PRN

## 2014-10-23 NOTE — Progress Notes (Signed)
   Subjective:    Patient ID: Justin Farley, male    DOB: 08-10-1960, 54 y.o.   MRN: [id number]  HPI Patient presents today with complaints of painful hemorrhoids- used cooling gel, suppositories, medicated wipes (all OTC) without significant relief. Has been taking a stool softener which has helped decrease pain. No blood on stool or on tissue. He thinks they were larger than they are now.He did use some ice when they first appeared. Normal colonoscopy 12/14.   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 Has backed off of weights with hemorrhoids.   Medications, allergies, past medical history, surgical history, family history, social history and problem list reviewed and updated.  Review of Systems No chest pain, no SOB, no fever, occasional cough, rarely uses inhaler, no wheezing    Objective:   Physical Exam  Constitutional: He is oriented to person, place, and time. He appears well-developed and well-nourished.  HENT:  Head: Normocephalic and atraumatic.  Eyes: Conjunctivae are normal.  Neck: Normal range of motion. Neck supple.  Cardiovascular: Normal rate.   Pulmonary/Chest: Effort normal.  Genitourinary: Rectal exam shows external hemorrhoid.  Small soft, external hemorrhoid.   Musculoskeletal: Normal range of motion.  Neurological: He is alert and oriented to person, place, and time.  Skin: Skin is warm and dry.  Psychiatric: He has a normal mood and affect. His behavior is normal. Judgment and thought content normal.  Vitals reviewed. BP 121/82 mmHg  Pulse 70  Temp(Src) 98.4 F (36.9 C) (Oral)  Resp 16  Ht 5' 4.5" (1.638 m)  Wt 158 lb 6.4 oz (71.85 kg)  BMI 26.78 kg/m2  SpO2 100%     Assessment & Plan:  1. External hemorrhoid - Provided written and verbal information regarding diagnosis and treatment. - hydrocortisone (ANUSOL-HC) 25 MG suppository; Place 1 suppository (25 mg total) rectally 2 (two) times daily as needed for hemorrhoids or itching.  Dispense: 24  suppository; Refill: 1 - Ambulatory referral to Gastroenterology - warm soaks  -follow up in 6 months for CPE  Clarene Reamer, FNP-BC  [facility]  10/23/2014 8:43 AM

## 2014-10-23 NOTE — Patient Instructions (Signed)
Our office will call you about an appointment with the gastroenterologist to treat your hemorrhoids  Hemorrhoids Hemorrhoids are swollen veins around the rectum or anus. There are two types of hemorrhoids:   Internal hemorrhoids. These occur in the veins just inside the rectum. They may poke through to the outside and become irritated and painful.  External hemorrhoids. These occur in the veins outside the anus and can be felt as a painful swelling or hard lump near the anus. CAUSES  Pregnancy.   Obesity.   Constipation or diarrhea.   Straining to have a bowel movement.   Sitting for long periods on the toilet.  Heavy lifting or other activity that caused you to strain.  Anal intercourse. SYMPTOMS   Pain.   Anal itching or irritation.   Rectal bleeding.   Fecal leakage.   Anal swelling.   One or more lumps around the anus.  DIAGNOSIS  Your caregiver may be able to diagnose hemorrhoids by visual examination. Other examinations or tests that may be performed include:   Examination of the rectal area with a gloved hand (digital rectal exam).   Examination of anal canal using a small tube (scope).   A blood test if you have lost a significant amount of blood.  A test to look inside the colon (sigmoidoscopy or colonoscopy). TREATMENT Most hemorrhoids can be treated at home. However, if symptoms do not seem to be getting better or if you have a lot of rectal bleeding, your caregiver may perform a procedure to help make the hemorrhoids get smaller or remove them completely. Possible treatments include:   Placing a rubber band at the base of the hemorrhoid to cut off the circulation (rubber band ligation).   Injecting a chemical to shrink the hemorrhoid (sclerotherapy).   Using a tool to burn the hemorrhoid (infrared light therapy).   Surgically removing the hemorrhoid (hemorrhoidectomy).   Stapling the hemorrhoid to block blood flow to the tissue  (hemorrhoid stapling).  HOME CARE INSTRUCTIONS   Eat foods with fiber, such as whole grains, beans, nuts, fruits, and vegetables. Ask your doctor about taking products with added fiber in them (fibersupplements).  Increase fluid intake. Drink enough water and fluids to keep your urine clear or pale yellow.   Exercise regularly.   Go to the bathroom when you have the urge to have a bowel movement. Do not wait.   Avoid straining to have bowel movements.   Keep the anal area dry and clean. Use wet toilet paper or moist towelettes after a bowel movement.   Medicated creams and suppositories may be used or applied as directed.   Only take over-the-counter or prescription medicines as directed by your caregiver.   Take warm sitz baths for 15-20 minutes, 3-4 times a day to ease pain and discomfort.   Place ice packs on the hemorrhoids if they are tender and swollen. Using ice packs between sitz baths may be helpful.   Put ice in a plastic bag.   Place a towel between your skin and the bag.   Leave the ice on for 15-20 minutes, 3-4 times a day.   Do not use a donut-shaped pillow or sit on the toilet for long periods. This increases blood pooling and pain.  SEEK MEDICAL CARE IF:  You have increasing pain and swelling that is not controlled by treatment or medicine.  You have uncontrolled bleeding.  You have difficulty or you are unable to have a bowel movement.  You have  pain or inflammation outside the area of the hemorrhoids. MAKE SURE YOU:  Understand these instructions.  Will watch your condition.  Will get help right away if you are not doing well or get worse. Document Released: 05/16/2000 Document Revised: 05/05/2012 Document Reviewed: 03/23/2012 [facility] Patient Information 2015 [place], [place]. This information is not intended to replace advice given to you by your health care provider. Make sure you discuss any questions you have with your health care  provider.

## 2014-11-08 ENCOUNTER — Encounter: Payer: Self-pay | Admitting: Physician Assistant

## 2014-11-08 ENCOUNTER — Ambulatory Visit (INDEPENDENT_AMBULATORY_CARE_PROVIDER_SITE_OTHER): Payer: 59 | Admitting: Physician Assistant

## 2014-11-08 VITALS — BP 114/78 | HR 76 | Ht 64.25 in | Wt 158.4 lb

## 2014-11-08 DIAGNOSIS — K648 Other hemorrhoids: Secondary | ICD-10-CM | POA: Diagnosis not present

## 2014-11-08 DIAGNOSIS — K644 Residual hemorrhoidal skin tags: Secondary | ICD-10-CM

## 2014-11-08 NOTE — Patient Instructions (Signed)
You have been scheduled for a Banding with Dr Carlean Purl for 11/28/14 at 3:15 pm Continue anusol suppositories at night as needed.

## 2014-11-08 NOTE — Progress Notes (Signed)
Patient ID: Justin Farley, male   DOB: [birth year], 54 y.o.   MRN: [id number]    Review of pertinent gastrointestinal problems: 1. routine risk for colon cancer, colonoscopy Dr. Ardis Hughs December 2014 was normal. Recall colonoscopy at 10 year interval 2. dyspepsia, somewhat GERD-like. EGD Dr. 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 Ardis Hughs with the above. He is here today with complaints of hemorrhoids that have been bothering bring him for several months. Recently he was evaluated in the urgent care center with complaints of painful hemorrhoids for which she was using coaling gels, suppositories, and medicated wipes without significant relief. He reports that he has internal hemorrhoids that at times flare and protrude from the rectum. When this happens they are quite  painful. He has to digitally push them back up into the rectum. Each time he has a bowel movement they pop back out. He has not had any bleeding. He states his hemorrhoids are quite bothersome for him and he has received information about banding elsewhere and is interested in evaluation for this.   Past Medical History  Diagnosis Date  . Hypertension   . GERD (gastroesophageal reflux disease)   . Allergy     Past Surgical History  Procedure Laterality Date  . Wisdom tooth extraction  2009   Family History  Problem Relation Age of Onset  . Hypertension Other   . Hypertension Mother   . Colon cancer Neg Hx    History  Substance Use Topics  . Smoking status: Never Smoker   . Smokeless tobacco: Never Used  . Alcohol Use: No   Current Outpatient Prescriptions  Medication Sig Dispense Refill  . albuterol (PROVENTIL HFA;VENTOLIN HFA) 108 (90 BASE) MCG/ACT inhaler Inhale 2 puffs into the lungs every 4 (four) hours as needed for wheezing. 1 Inhaler 2  . hydrochlorothiazide (MICROZIDE) 12.5 MG  capsule TAKE 1 CAPSULE EVERY MORNING.  FOLLOW NEEDED FOR ADDITIONAL REFILLS 2ND. 90 capsule 2  . hydrocortisone (ANUSOL-HC) 25 MG suppository Place 1 suppository (25 mg total) rectally 2 (two) times daily as needed for hemorrhoids or itching. 24 suppository 1  . omeprazole (PRILOSEC) 40 MG capsule Take 1 capsule (40 mg total) by mouth daily. 30 capsule 1   No current facility-administered medications for this visit.   No Known Allergies    Review of Systems: Her history of present illness otherwise negative    Physical Exam: General: Pleasant, well developed , African-American male in no acute distress Head: Normocephalic and atraumatic Eyes:  sclerae anicteric, conjunctiva pink  Ears: Normal auditory acuity Lungs: Clear throughout to auscultation Heart: Regular rate and rhythm Abdomen: Soft, non distended, non-tender. No masses, no hepatomegaly. Normal bowel sounds Rectal: External hemorrhoid noted. Anoscopy performed and posterior internal hemorrhoids noted as well. Musculoskeletal: Symmetrical with no gross deformities  Extremities: No edema  Neurological: Alert oriented x 4, grossly nonfocal Psychological:  Alert and cooperative. Normal mood and affect  Assessment and Recommendations: 54 year old male here for evaluation of hemorrhoids. For his external hemorrhoids he will continue Tucks wipes. For his internal hemorrhoids he will continue the Anusol [facility] suppositories that were prescribed for him at the urgent care facility. He is using one at at bedtime. He will return in several weeks for banding with Dr. Carlean Purl if deemed appropriate.        Dammon Makarewicz, Vita Barley PA-C 11/08/2014,

## 2014-11-09 NOTE — Progress Notes (Signed)
i agree with the above note, plan 

## 2014-11-28 ENCOUNTER — Encounter: Payer: Self-pay | Admitting: Internal Medicine

## 2014-11-28 ENCOUNTER — Ambulatory Visit (INDEPENDENT_AMBULATORY_CARE_PROVIDER_SITE_OTHER): Payer: 59 | Admitting: Internal Medicine

## 2014-11-28 VITALS — BP 116/82 | HR 82 | Ht 64.25 in | Wt 158.6 lb

## 2014-11-28 DIAGNOSIS — K648 Other hemorrhoids: Secondary | ICD-10-CM | POA: Diagnosis not present

## 2014-11-28 HISTORY — DX: Other hemorrhoids: K64.8

## 2014-11-28 NOTE — Progress Notes (Signed)
Patient ID: Justin Farley, male   DOB: Oct 17, 1960, 54 y.o.   MRN: [id number]        Hemorrhoids  Sxs are bulging, irritation, itching and prolapse. No bleeding. No sharp pain.  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 Small external component noted - RP/LL  PROCEDURE NOTE: The patient presents with symptomatic grade 2 hemorrhoids, requesting rubber band ligation of his/her hemorrhoidal disease.  All risks, benefits and alternative forms of therapy were described and informed consent was obtained.   The anorectum was pre-medicated with lidocaine and NTG The decision was made to band the RP internal hemorrhoid, and the [place] was used to perform band ligation without complication.  Digital anorectal examination was then performed to assure proper positioning of the band, and to adjust the banded tissue as required.  The patient was discharged home without pain or other issues.  Dietary and behavioral recommendations were given and along with follow-up instructions.       The patient will return in 2-4 weeks for  follow-up and possible additional banding as required. No complications were encountered and the patient tolerated the procedure well.  I appreciate the opportunity to care for this patient. IR:JJOACZY, Dalbert Batman, FNP

## 2014-11-28 NOTE — Assessment & Plan Note (Signed)
RP banded RTC 2-4 weeks repeat banding

## 2014-11-28 NOTE — Patient Instructions (Addendum)
HEMORRHOID BANDING PROCEDURE    FOLLOW-UP CARE   1. The procedure you have had should have been relatively painless since the banding of the area involved does not have nerve endings and there is no pain sensation.  The rubber band cuts off the blood supply to the hemorrhoid and the band may fall off as soon as 48 hours after the banding (the band may occasionally be seen in the toilet bowl following a bowel movement). You may notice a temporary feeling of fullness in the rectum which should respond adequately to plain Tylenol or Motrin.  2. Following the banding, avoid strenuous exercise that evening and resume full activity the next day.  A sitz bath (soaking in a warm tub) or bidet is soothing, and can be useful for cleansing the area after bowel movements.     3. To avoid constipation, take two tablespoons of natural wheat bran, natural oat bran, flax, Benefiber or any over the counter fiber supplement and increase your water intake to 7-8 glasses daily.    4. Unless you have been prescribed anorectal medication, do not put anything inside your rectum for two weeks: No suppositories, enemas, fingers, etc.  5. Occasionally, you may have more bleeding than usual after the banding procedure.  This is often from the untreated hemorrhoids rather than the treated one.  Don't be concerned if there is a tablespoon or so of blood.  If there is more blood than this, lie flat with your bottom higher than your head and apply an ice pack to the area. If the bleeding does not stop within a half an hour or if you feel faint, call our office at [phone] or go to the emergency room.  6. Problems are not common; however, if there is a substantial amount of bleeding, severe pain, chills, fever or difficulty passing urine (very rare) or other problems, you should call us at [phone] or report to the nearest emergency room.  7. Do not stay seated continuously for more than 2-3 hours for a day or two  after the procedure.  Tighten your buttock muscles 10-15 times every two hours and take 10-15 deep breaths every 1-2 hours.  Do not spend more than a few minutes on the toilet if you cannot empty your bowel; instead re-visit the toilet at a later time.    We will see you at your next appointment for another banding, appt. Made for 12/25/14 at 3:15pm.  Hold off using your rectal suppositories while we are doing the bandings.   I appreciate the opportunity to care for you. Silvano Rusk, MD, [facility]

## 2014-12-19 ENCOUNTER — Encounter: Payer: 59 | Admitting: Family Medicine

## 2014-12-25 ENCOUNTER — Ambulatory Visit (INDEPENDENT_AMBULATORY_CARE_PROVIDER_SITE_OTHER): Payer: 59 | Admitting: Internal Medicine

## 2014-12-25 ENCOUNTER — Encounter: Payer: Self-pay | Admitting: Internal Medicine

## 2014-12-25 VITALS — BP 110/72 | HR 76 | Ht 64.25 in | Wt 158.0 lb

## 2014-12-25 DIAGNOSIS — K648 Other hemorrhoids: Secondary | ICD-10-CM

## 2014-12-25 NOTE — Progress Notes (Signed)
    PROCEDURE NOTE: The patient presents with symptomatic grade 2  hemorrhoids, requesting rubber band ligation of his/her hemorrhoidal disease.  All risks, benefits and alternative forms of therapy were described and informed consent was obtained.  He has had banding of RP internal hemorrhoids and reports significant improvement of prolapse, irritation and itching.  The anorectum was pre-medicated with 0.125% NTG and 5% lidocaine The decision was made to band the LL internal hemorrhoid, and the [place] was used to perform band ligation without complication.  Digital anorectal examination was then performed to assure proper positioning of the band, and to adjust the banded tissue as required.  The patient was discharged home without pain or other issues.  Dietary and behavioral recommendations were given and along with follow-up instructions.      The patient will return as needed for  follow-up and possible additional banding as required. No complications were encountered and the patient tolerated the procedure well.  VB:TYOMAYO, Justin Batman, FNP

## 2014-12-25 NOTE — Patient Instructions (Signed)
Please follow up with Dr. Carlean Purl as needed

## 2014-12-25 NOTE — Assessment & Plan Note (Signed)
LL banded He will f/u prn as RA was Gr 1 - (RP and LL were Gr2)

## 2015-01-30 ENCOUNTER — Encounter: Payer: Self-pay | Admitting: Family Medicine

## 2015-01-30 ENCOUNTER — Ambulatory Visit (INDEPENDENT_AMBULATORY_CARE_PROVIDER_SITE_OTHER): Payer: 59 | Admitting: Family Medicine

## 2015-01-30 VITALS — BP 136/86 | HR 70 | Temp 98.0°F | Resp 16 | Ht 64.5 in | Wt 159.0 lb

## 2015-01-30 DIAGNOSIS — Z1389 Encounter for screening for other disorder: Secondary | ICD-10-CM

## 2015-01-30 DIAGNOSIS — Z139 Encounter for screening, unspecified: Secondary | ICD-10-CM | POA: Diagnosis not present

## 2015-01-30 DIAGNOSIS — Z13220 Encounter for screening for lipoid disorders: Secondary | ICD-10-CM

## 2015-01-30 DIAGNOSIS — Z0000 Encounter for general adult medical examination without abnormal findings: Secondary | ICD-10-CM

## 2015-01-30 DIAGNOSIS — Z130 Encounter for screening for diseases of the blood and blood-forming organs and certain disorders involving the immune mechanism: Secondary | ICD-10-CM

## 2015-01-30 DIAGNOSIS — I10 Essential (primary) hypertension: Secondary | ICD-10-CM

## 2015-01-30 DIAGNOSIS — Z131 Encounter for screening for diabetes mellitus: Secondary | ICD-10-CM

## 2015-01-30 DIAGNOSIS — K219 Gastro-esophageal reflux disease without esophagitis: Secondary | ICD-10-CM

## 2015-01-30 DIAGNOSIS — Z125 Encounter for screening for malignant neoplasm of prostate: Secondary | ICD-10-CM | POA: Diagnosis not present

## 2015-01-30 LAB — CBC
HCT: 43.6 % (ref 39.0–52.0)
Hemoglobin: 14.6 g/dL (ref 13.0–17.0)
MCH: 29.4 pg (ref 26.0–34.0)
MCHC: 33.5 g/dL (ref 30.0–36.0)
MCV: 87.9 fL (ref 78.0–100.0)
MPV: 11 fL (ref 8.6–12.4)
Platelets: 204 10*3/uL (ref 150–400)
RBC: 4.96 MIL/uL (ref 4.22–5.81)
RDW: 14 % (ref 11.5–15.5)
WBC: 4 10*3/uL (ref 4.0–10.5)

## 2015-01-30 LAB — LIPID PANEL
Cholesterol: 157 mg/dL (ref 125–200)
HDL: 59 mg/dL (ref >=40)
LDL Cholesterol: 78 mg/dL (ref <130)
Total CHOL/HDL Ratio: 2.7 Ratio (ref <=5.0)
Triglycerides: 101 mg/dL (ref <150)
VLDL: 20 mg/dL (ref <30)

## 2015-01-30 LAB — POCT URINALYSIS DIPSTICK
Bilirubin, UA: NEGATIVE
Blood, UA: NEGATIVE
Glucose, UA: NEGATIVE
Ketones, UA: NEGATIVE
Leukocytes, UA: NEGATIVE
Nitrite, UA: NEGATIVE
Protein, UA: NEGATIVE
Spec Grav, UA: 1.02
Urobilinogen, UA: 0.2
pH, UA: 6

## 2015-01-30 LAB — COMPREHENSIVE METABOLIC PANEL
ALT: 36 U/L (ref 9–46)
AST: 26 U/L (ref 10–35)
Albumin: 4.5 g/dL (ref 3.6–5.1)
Alkaline Phosphatase: 28 U/L — ABNORMAL LOW (ref 40–115)
BUN: 9 mg/dL (ref 7–25)
CO2: 30 mmol/L (ref 20–31)
Calcium: 9.3 mg/dL (ref 8.6–10.3)
Chloride: 102 mmol/L (ref 98–110)
Creat: 1.09 mg/dL (ref 0.70–1.33)
Glucose, Bld: 99 mg/dL (ref 65–99)
Potassium: 4.6 mmol/L (ref 3.5–5.3)
Sodium: 139 mmol/L (ref 135–146)
Total Bilirubin: 1.2 mg/dL (ref 0.2–1.2)
Total Protein: 7.4 g/dL (ref 6.1–8.1)

## 2015-01-30 MED ORDER — OMEPRAZOLE 40 MG PO CPDR: 40.0000 mg | DELAYED_RELEASE_CAPSULE | Freq: Every day | ORAL | Status: DC

## 2015-01-30 MED ORDER — HYDROCHLOROTHIAZIDE 12.5 MG PO CAPS: ORAL_CAPSULE | ORAL | Status: DC

## 2015-01-30 NOTE — Patient Instructions (Signed)
Keep up the great work with healthy eating and regular exercise Check the date of your last tetanus shot and let us know at your next visit

## 2015-01-30 NOTE — Progress Notes (Signed)
Subjective:    Patient ID: Justin Farley, male    DOB: July 15, 1960, 54 y.o.   MRN: [id number]  HPI This is a pleasant 54 yo male who presents today for CPE.   Last CPE- unknown PSA- 9/14 Colonoscopy- 12/14 Tdap- thinks he is up to date Flu- annual Dental- regular Eye- regular Exercise- regular  He has a history of GERD, uses prilosec 2x week. Had hemorrhoid banding with good results 6/16.    Past Medical History  Diagnosis Date  . Hypertension   . GERD (gastroesophageal reflux disease)   . Allergy   . Internal hemorrhoids with grade 2 prolapse 11/28/2014  . Gastritis   . Esophagitis    Past Surgical History  Procedure Laterality Date  . Wisdom tooth extraction  2009  . Hemorrhoid banding    . Colonoscopy    . Esophagogastroduodenoscopy     Family History  Problem Relation Age of Onset  . Hypertension Other   . Hypertension Mother   . Colon cancer Neg Hx    Social History  Substance Use Topics  . Smoking status: Never Smoker   . Smokeless tobacco: Never Used  . Alcohol Use: No  Medications, allergies, past medical history, surgical history, family history, social history and problem list reviewed and updated.  Review of Systems  Constitutional: Negative.   HENT: Negative.   Eyes: Negative.   Respiratory: Negative.   Cardiovascular: Negative.   Gastrointestinal: Negative.   Endocrine: Negative.   Genitourinary: Negative.   Musculoskeletal: Negative.   Skin: Negative.   Allergic/Immunologic: Negative.   Neurological: Negative.   Hematological: Negative.   Psychiatric/Behavioral: Negative.       Objective:   Physical Exam Physical Exam  Constitutional: He is oriented to person, place, and time. He appears well-developed and well-nourished.  HENT:  Head: Normocephalic and atraumatic.  Right Ear: External ear normal.  Left Ear: External ear normal.  Nose: Nose normal.  Mouth/Throat: Oropharynx is clear and moist.  Eyes: Conjunctivae are  normal. Pupils are equal, round, and reactive to light.  Neck: Normal range of motion. Neck supple.  Cardiovascular: Normal rate, regular rhythm, normal heart sounds and intact distal pulses.   Pulmonary/Chest: Effort normal and breath sounds normal.  Abdominal: Soft. Bowel sounds are normal. Hernia confirmed negative in the right inguinal area and confirmed negative in the left inguinal area.  Genitourinary: Testes normal and penis normal. Circumcised. Prostate smooth, non tender, not enlarged.  Musculoskeletal: Normal range of motion. He exhibits no edema or tenderness.       Cervical back: Normal.       Thoracic back: Normal.       Lumbar back: Normal.  Lymphadenopathy:    He has no cervical adenopathy.       Right: No inguinal adenopathy present.       Left: No inguinal adenopathy present.  Neurological: He is alert and oriented to person, place, and time. He has normal reflexes.  Skin: Skin is warm and dry.  Psychiatric: He has a normal mood and affect. His behavior is normal. Judgment normal.  Vitals reviewed. BP 136/86 mmHg  Pulse 70  Temp(Src) 98 F (36.7 C) (Oral)  Resp 16  Ht 5' 4.5" (1.638 m)  Wt 159 lb (72.122 kg)  BMI 26.88 kg/m2 Wt Readings from Last 3 Encounters:  01/30/15 159 lb (72.122 kg)  12/25/14 158 lb (71.668 kg)  11/28/14 158 lb 9.6 oz (71.94 kg)      Assessment & Plan:  1.  Annual physical exam - encouraged continued healthy eating and regular exercise  2. Screening for prostate cancer - PSA  3. Screening for deficiency anemia - CBC  4. Screening for hematuria or proteinuria - POCT urinalysis dipstick  5. Screening for nephropathy - Comprehensive metabolic panel  6. Screening for diabetes mellitus - Comprehensive metabolic panel  7. Screening for lipid disorders - Lipid panel  8. Essential hypertension - hydrochlorothiazide (MICROZIDE) 12.5 MG capsule; TAKE 1 CAPSULE EVERY MORNING.  Dispense: 90 capsule; Refill: 3 - follow up in 6  months  Clarene Reamer, FNP-BC  [facility]  01/30/2015 9:22 AM

## 2015-01-31 LAB — PSA: PSA: 2.66 ng/mL (ref <=4.00)

## 2015-02-02 ENCOUNTER — Encounter: Payer: Self-pay | Admitting: Family Medicine

## 2015-06-15 ENCOUNTER — Telehealth: Payer: Self-pay | Admitting: Family Medicine

## 2015-06-15 NOTE — Telephone Encounter (Signed)
lmom to call and reschedule appt that he had on 07-31-15 with Jackelyn Poling

## 2015-07-31 ENCOUNTER — Ambulatory Visit: Payer: 59 | Admitting: Family Medicine

## 2015-08-01 ENCOUNTER — Ambulatory Visit: Payer: 59 | Admitting: Family Medicine

## 2015-08-08 ENCOUNTER — Ambulatory Visit (INDEPENDENT_AMBULATORY_CARE_PROVIDER_SITE_OTHER): Payer: 59 | Admitting: Family Medicine

## 2015-08-08 ENCOUNTER — Encounter: Payer: Self-pay | Admitting: Family Medicine

## 2015-08-08 VITALS — BP 120/82 | HR 71 | Temp 98.4°F | Resp 18 | Ht 65.0 in | Wt 155.2 lb

## 2015-08-08 DIAGNOSIS — D171 Benign lipomatous neoplasm of skin and subcutaneous tissue of trunk: Secondary | ICD-10-CM

## 2015-08-08 DIAGNOSIS — I10 Essential (primary) hypertension: Secondary | ICD-10-CM

## 2015-08-08 DIAGNOSIS — K219 Gastro-esophageal reflux disease without esophagitis: Secondary | ICD-10-CM

## 2015-08-08 MED ORDER — HYDROCHLOROTHIAZIDE 12.5 MG PO CAPS: ORAL_CAPSULE | ORAL | Status: DC

## 2015-08-08 MED ORDER — OMEPRAZOLE 40 MG PO CPDR: 40.0000 mg | DELAYED_RELEASE_CAPSULE | Freq: Every day | ORAL | Status: DC

## 2015-08-08 NOTE — Patient Instructions (Signed)
Please call in July for your physical appointment in August  Lipoma A lipoma is a noncancerous (benign) tumor that is made up of fat cells. This is a very common type of soft-tissue growth. Lipomas are usually found under the skin (subcutaneous). They may occur in any tissue of the body that contains fat. Common areas for lipomas to appear include the back, shoulders, buttocks, and thighs. Lipomas grow slowly, and they are usually painless. Most lipomas do not cause problems and do not require treatment. CAUSES The cause of this condition is not known. RISK FACTORS This condition is more likely to develop in:  People who are 64-54 years old.  People who have a family history of lipomas. SYMPTOMS A lipoma usually appears as a small, round bump under the skin. It may feel soft or rubbery, but the firmness can vary. Most lipomas are not painful. However, a lipoma may become painful if it is located in an area where it pushes on nerves. DIAGNOSIS A lipoma can usually be diagnosed with a physical exam. You may also have tests to confirm the diagnosis and to rule out other conditions. Tests may include:  Imaging tests, such as a CT scan or MRI.  Removal of a tissue sample to be looked at under a microscope (biopsy). TREATMENT Treatment is not needed for small lipomas that are not causing problems. If a lipoma continues to get bigger or it causes problems, removal is often the best option. Lipomas can also be removed to improve appearance. Removal of a lipoma is usually done with a surgery in which the fatty cells and the surrounding capsule are removed. Most often, a medicine that numbs the area (local anesthetic) is used for this procedure. HOME CARE INSTRUCTIONS  Keep all follow-up visits as directed by your health care provider. This is important. SEEK MEDICAL CARE IF:  Your lipoma becomes larger or hard.  Your lipoma becomes painful, red, or increasingly swollen. These could be signs of  infection or a more serious condition.   This information is not intended to replace advice given to you by your health care provider. Make sure you discuss any questions you have with your health care provider.   Document Released: 05/09/2002 Document Revised: 10/03/2014 Document Reviewed: 05/15/2014 Elsevier Interactive Patient Education [employer].

## 2015-08-08 NOTE — Progress Notes (Signed)
   Subjective:    Patient ID: Justin Farley, male    DOB: [birth year], 55 y.o.   MRN: [id number]  HPI This is a pleasant 55 yo male who presents today for follow up of HTN. He continues to take HCTZ 12.5 mg. Denies side effects. He checks his blood pressure at home and it runs 120/80s.  He occasionally takes omeprazole for heartburn. He had hemorrhoid banding with good results.  He has noticed a swelling in his right upper chest over the last 2 weeks. It has not changed size. It is not painful or red.    Past Medical History  Diagnosis Date  . Hypertension   . GERD (gastroesophageal reflux disease)   . Allergy   . Internal hemorrhoids with grade 2 prolapse 11/28/2014  . Gastritis   . Esophagitis    Past Surgical History  Procedure Laterality Date  . Wisdom tooth extraction  2009  . Hemorrhoid banding    . Colonoscopy    . Esophagogastroduodenoscopy     Family History  Problem Relation Age of Onset  . Hypertension Other   . Hypertension Mother   . Colon cancer Neg Hx    Social History  Substance Use Topics  . Smoking status: Never Smoker   . Smokeless tobacco: Never Used  . Alcohol Use: No      Review of Systems Per HPI    Objective:   Physical Exam  Constitutional: He is oriented to person, place, and time. He appears well-developed and well-nourished.  HENT:  Head: Normocephalic and atraumatic.  Eyes: Conjunctivae are normal.  Neck: Normal range of motion.  Cardiovascular: Normal rate.   Pulmonary/Chest: Effort normal.  Musculoskeletal: Normal range of motion.  Lymphadenopathy:       Head (right side): No submental, no submandibular, no tonsillar, no preauricular, no posterior auricular and no occipital adenopathy present.       Right cervical: No superficial cervical, no deep cervical and no posterior cervical adenopathy present.      Right axillary: No pectoral and no lateral adenopathy present.  Neurological: He is alert and oriented to person, place,  and time.  Skin: Skin is warm and dry.     Vitals reviewed.         Assessment & Plan:  1. Essential hypertension - hydrochlorothiazide (MICROZIDE) 12.5 MG capsule; TAKE 1 CAPSULE EVERY MORNING.  Dispense: 90 capsule; Refill: 3  2. Gastroesophageal reflux disease, esophagitis presence not specified - omeprazole (PRILOSEC) 40 MG capsule; Take 1 capsule (40 mg total) by mouth daily.  Dispense: 30 capsule; Refill: 1  3. Lipoma of skin and subcutaneous tissue of trunk - Provided written and verbal information regarding diagnosis and treatment. - RTC if it becomes red, tender or increases in size   Clarene Reamer, FNP-BC  [facility] [place] [facility]  08/08/2015 10:10 AM

## 2015-08-08 NOTE — Progress Notes (Signed)
Subjective:    Patient ID: Justin Farley, male    DOB: Jun 17, 1960, 55 y.o.   MRN: [id number]  HPI  This is a pleasant 55 year old that presents with a blood pressure check-up. Patient checks his pressure at home- 120/80's. He reports that he takes his medication everyday and is feeling great. Patient states that he has an a dry cough for about 2 months now. Patient also reports a knot that his visible in the right anterior shoulder. Nodule is not painful.   Past Medical History  Diagnosis Date  . Hypertension   . GERD (gastroesophageal reflux disease)   . Allergy   . Internal hemorrhoids with grade 2 prolapse 11/28/2014  . Gastritis   . Esophagitis    Family History  Problem Relation Age of Onset  . Hypertension Other   . Hypertension Mother   . Colon cancer Neg Hx    Social History   Social History  . Marital Status: Single    Spouse Name: N/A  . Number of Children: N/A  . Years of Education: N/A   Occupational History  . Freight forwarder    Social History Main Topics  . Smoking status: Never Smoker   . Smokeless tobacco: Never Used  . Alcohol Use: No  . Drug Use: No  . Sexual Activity: Yes    Birth Control/ Protection: Condom   Other Topics Concern  . Not on file   Social History Narrative   Single. Education: [employer]. Exercise: 3 days a week for 2 hours.     Review of Systems  Constitutional: Negative for activity change, appetite change and fatigue.  HENT: Negative for congestion, rhinorrhea, sinus pressure and sore throat.   Respiratory: Positive for cough (intermittent). Negative for chest tightness, shortness of breath and wheezing.   Cardiovascular: Negative for chest pain.  Gastrointestinal: Negative for vomiting, diarrhea and constipation.  Musculoskeletal: Negative for back pain.       Small bump to upper anterior right shoulder  Neurological: Negative for dizziness.       Objective:   Physical Exam  Constitutional: He is oriented  to person, place, and time. He appears well-developed and well-nourished.  HENT:  Head: Normocephalic.  Neck: Normal range of motion. No thyromegaly present.  Cardiovascular: Normal rate, regular rhythm and normal heart sounds.   Pulmonary/Chest: Effort normal and breath sounds normal. No respiratory distress. He has no wheezes.  Musculoskeletal: Normal range of motion.  Neurological: He is alert and oriented to person, place, and time.  Skin: Skin is warm and dry.  Small, 2cm lipoma to right anterior shoulder.   Psychiatric: He has a normal mood and affect. His behavior is normal. Judgment and thought content normal.      BP 120/82 mmHg  Pulse 71  Temp(Src) 98.4 F (36.9 C) (Oral)  Resp 18  Ht 5\' 5"  (1.651 m)  Wt 155 lb 3.2 oz (70.398 kg)  BMI 25.83 kg/m2  SpO2 98%     Wt Readings from Last 3 Encounters:  08/08/15 155 lb 3.2 oz (70.398 kg)  01/30/15 159 lb (72.122 kg)  12/25/14 158 lb (71.668 kg)   Temp Readings from Last 3 Encounters:  08/08/15 98.4 F (36.9 C) Oral  01/30/15 98 F (36.7 C) Oral  10/23/14 98.4 F (36.9 C) Oral   BP Readings from Last 3 Encounters:  08/08/15 120/82  01/30/15 136/86  12/25/14 110/72   Pulse Readings from Last 3 Encounters:  08/08/15 71  01/30/15 70  12/25/14 76    Assessment & Plan:  1. Essential hypertension - hydrochlorothiazide (MICROZIDE) 12.5 MG capsule; TAKE 1 CAPSULE EVERY MORNING.  Dispense: 90 capsule; Refill: 3  2. Gastroesophageal reflux disease, esophagitis presence not specified - omeprazole (PRILOSEC) 40 MG capsule; Take 1 capsule (40 mg total) by mouth daily.  Dispense: 30 capsule; Refill: 1  3. Lipoma of skin and subcutaneous tissue of trunk -monitor and return if pain occurs or if lipoma increases  RTC precautions reviewed.   Steffanie Dunn, FNP-student  [facility] [place] [facility]  08/08/2015 10:13 AM

## 2015-08-15 ENCOUNTER — Other Ambulatory Visit: Payer: Self-pay

## 2015-08-15 DIAGNOSIS — J9801 Acute bronchospasm: Secondary | ICD-10-CM

## 2015-08-15 NOTE — Telephone Encounter (Signed)
Debbie, you have recently seen pt, but albuterol has not been discussed at recent OVs that I can see. OK to give RFs?

## 2015-08-16 MED ORDER — ALBUTEROL SULFATE HFA 108 (90 BASE) MCG/ACT IN AERS: 2.0000 | INHALATION_SPRAY | RESPIRATORY_TRACT | Status: DC | PRN

## 2015-11-24 ENCOUNTER — Other Ambulatory Visit: Payer: Self-pay | Admitting: Family Medicine

## 2015-11-28 ENCOUNTER — Ambulatory Visit (INDEPENDENT_AMBULATORY_CARE_PROVIDER_SITE_OTHER): Payer: 59 | Admitting: Family Medicine

## 2015-11-28 VITALS — BP 110/74 | HR 79 | Temp 98.1°F | Resp 18 | Ht 65.0 in | Wt 152.0 lb

## 2015-11-28 DIAGNOSIS — K648 Other hemorrhoids: Secondary | ICD-10-CM | POA: Diagnosis not present

## 2015-11-28 DIAGNOSIS — K625 Hemorrhage of anus and rectum: Secondary | ICD-10-CM

## 2015-11-28 DIAGNOSIS — K219 Gastro-esophageal reflux disease without esophagitis: Secondary | ICD-10-CM

## 2015-11-28 DIAGNOSIS — R1084 Generalized abdominal pain: Secondary | ICD-10-CM | POA: Diagnosis not present

## 2015-11-28 LAB — POCT CBC
Granulocyte percent: 49.9 %G (ref 37–80)
HCT, POC: 44 % (ref 43.5–53.7)
Hemoglobin: 15.7 g/dL (ref 14.1–18.1)
Lymph, poc: 2.5 (ref 0.6–3.4)
MCH, POC: 30.7 pg (ref 27–31.2)
MCHC: 35.7 g/dL — AB (ref 31.8–35.4)
MCV: 85.9 fL (ref 80–97)
MID (cbc): 0.2 (ref 0–0.9)
MPV: 7.7 fL (ref 0–99.8)
POC Granulocyte: 2.7 (ref 2–6.9)
POC LYMPH PERCENT: 46.6 %L (ref 10–50)
POC MID %: 3.5 %M (ref 0–12)
Platelet Count, POC: 160 10*3/uL (ref 142–424)
RBC: 5.12 M/uL (ref 4.69–6.13)
RDW, POC: 13.8 %
WBC: 5.4 10*3/uL (ref 4.6–10.2)

## 2015-11-28 LAB — POC MICROSCOPIC URINALYSIS (UMFC): Mucus: ABSENT

## 2015-11-28 LAB — POCT URINALYSIS DIP (MANUAL ENTRY)
Bilirubin, UA: NEGATIVE
Blood, UA: NEGATIVE
Glucose, UA: NEGATIVE
Ketones, POC UA: NEGATIVE
Leukocytes, UA: NEGATIVE
Nitrite, UA: NEGATIVE
Protein Ur, POC: NEGATIVE
Spec Grav, UA: 1.025
Urobilinogen, UA: 0.2
pH, UA: 5.5

## 2015-11-28 NOTE — Progress Notes (Addendum)
By signing my name below, I, Mesha Guinyard, attest that this documentation has been prepared under the direction and in the presence of [name], MD.  Electronically Signed: Verlee Monte, Medical Scribe. 11/28/2015. 5:10 PM.  Subjective:    Patient ID: Justin Farley, male    DOB: Feb 15, 1961, 55 y.o.   MRN: [id number]  HPI Chief Complaint  Patient presents with  . Abdominal Pain  . Hemorrhoids    Banding last year, bothering him.     HPI Comments: Justin Farley is a 55 y.o. male who [facility] complaining of intermittent abdominal pain onset 2 weeks. Pt has had hemorrhoids for 2-3 weeks now. Pt has a PMHx of GERD and internal hemorrhoids with prolapse of hemorroids in the past. He had banding performed by Dr. Carlean Purl July and June 2016.  Abdominal Pain: Pt mentions he has intermittent abdominal pain onset 2 weeks. Pain worsens with food. Pt reports a little nausea without emesis. Pt has been on Prilosec for 3-4 years  Hemorrhoids: Pt has had hemorrhoids for 2-3 weeks now- pain occurs with bowel movement. Pt reports soreness. Pt was constipated a week ago. Pt has had blood when he wipes a few times after a bm- pt has a bm every day. Pt has had a nl stool today with his bm. Pt denies itchiness, diarrhea, melena, and EtOH intake. Pt was unsure if he had a fever because it was hot at his job and he was sweating but this isn't uncommon for him. Pt denies PMHx of peptic ulcer. Pt still has his appendix and gallbladder.  Pt takes aleve once a month PRN. Pt is okay with taking Tylenol and Ibuprofen.   Patient Active Problem List   Diagnosis Date Noted  . Internal hemorrhoids with grade 2 prolapse 11/28/2014  . HTN (hypertension) 04/19/2013  . GERD (gastroesophageal reflux disease) 04/19/2013   Past Medical History  Diagnosis Date  . Hypertension   . GERD (gastroesophageal reflux disease)   . Allergy   . Internal hemorrhoids with grade 2  prolapse 11/28/2014  . Gastritis   . Esophagitis    Past Surgical History  Procedure Laterality Date  . Wisdom tooth extraction  2009  . Hemorrhoid banding    . Colonoscopy    . Esophagogastroduodenoscopy     No Known Allergies Prior to Admission medications   Medication Sig Start Date End Date Taking? Authorizing Provider  albuterol (PROVENTIL HFA;VENTOLIN HFA) 108 (90 Base) MCG/ACT inhaler Inhale 2 puffs into the lungs every 4 (four) hours as needed for wheezing. 08/16/15   Elby Beck, FNP  hydrochlorothiazide (MICROZIDE) 12.5 MG capsule TAKE 1 CAPSULE EVERY MORNING. 08/08/15   Elby Beck, FNP  omeprazole (PRILOSEC) 40 MG capsule TAKE 1 CAPSULE (40 MG TOTAL) BY MOUTH DAILY. 11/25/15   Elby Beck, FNP   Social History   Social History  . Marital Status: Single    Spouse Name: N/A  . Number of Children: N/A  . Years of Education: N/A   Occupational History  . Freight forwarder    Social History Main Topics  . Smoking status: Never Smoker   . Smokeless tobacco: Never Used  . Alcohol Use: No  . Drug Use: No  . Sexual Activity: Yes    Birth Control/ Protection: Condom   Other Topics Concern  . Not on file   Social History Narrative   Single. Education: [employer]. Exercise: 3 days a week for 2 hours.  Review of Systems  Constitutional: Negative for diaphoresis.  Gastrointestinal: Positive for nausea (a little), constipation and blood in stool. Negative for vomiting, abdominal pain and diarrhea.   Objective:  BP 110/74 mmHg  Pulse 79  Temp(Src) 98.1 F (36.7 C) (Oral)  Resp 18  Ht 5\' 5"  (1.651 m)  Wt 152 lb (68.947 kg)  BMI 25.29 kg/m2  SpO2 98%  Physical Exam  Constitutional: He is oriented to person, place, and time. He appears well-developed and well-nourished.  HENT:  Head: Normocephalic and atraumatic.  Eyes: EOM are normal. Pupils are equal, round, and reactive to light.  Neck: No JVD present. Carotid bruit is not present.    Cardiovascular: Normal rate, regular rhythm and normal heart sounds.   No murmur heard. Pulmonary/Chest: Effort normal and breath sounds normal. He has no rales.  Abdominal: Soft. Bowel sounds are normal. He exhibits no distension. There is no tenderness. There is no CVA tenderness.  Genitourinary:  Small skin tag- possible external hemorrhoid, non-thrombosed No active bleeding No fissures  Musculoskeletal: He exhibits no edema.  Neurological: He is alert and oriented to person, place, and time.  Skin: Skin is warm and dry.  Psychiatric: He has a normal mood and affect.  Vitals reviewed.  Results for orders placed or performed in visit on 11/28/15  POCT urinalysis dipstick  Result Value Ref Range   Color, UA yellow yellow   Clarity, UA clear clear   Glucose, UA negative negative   Bilirubin, UA negative negative   Ketones, POC UA negative negative   Spec Grav, UA 1.025    Blood, UA negative negative   pH, UA 5.5    Protein Ur, POC negative negative   Urobilinogen, UA 0.2    Nitrite, UA Negative Negative   Leukocytes, UA Negative Negative  POCT Microscopic Urinalysis ([facility])  Result Value Ref Range   WBC,UR,HPF,POC None None WBC/hpf   RBC,UR,HPF,POC None None RBC/hpf   Bacteria None None, Too numerous to count   Mucus Absent Absent   Epithelial Cells, UR Per Microscopy None None, Too numerous to count cells/hpf  POCT CBC  Result Value Ref Range   WBC 5.4 4.6 - 10.2 K/uL   Lymph, poc 2.5 0.6 - 3.4   POC LYMPH PERCENT 46.6 10 - 50 %L   MID (cbc) 0.2 0 - 0.9   POC MID % 3.5 0 - 12 %M   POC Granulocyte 2.7 2 - 6.9   Granulocyte percent 49.9 37 - 80 %G   RBC 5.12 4.69 - 6.13 M/uL   Hemoglobin 15.7 14.1 - 18.1 g/dL   HCT, POC 44.0 43.5 - 53.7 %   MCV 85.9 80 - 97 fL   MCH, POC 30.7 27 - 31.2 pg   MCHC 35.7 (A) 31.8 - 35.4 g/dL   RDW, POC 13.8 %   Platelet Count, POC 160 142 - 424 K/uL   MPV 7.7 0 - 99.8 fL   Assessment & Plan:  Justin Farley is a 55 y.o.  male Generalized abdominal pain - Plan: POCT urinalysis dipstick, POCT Microscopic Urinalysis ([facility]), POCT CBC, COMPLETE METABOLIC PANEL WITH GFR, Lipase, H. pylori breath test Gastroesophageal reflux disease, esophagitis presence not specified - Plan: H. pylori breath test  - Reassuring exam. Continue PPI for GERD and possible gastritis, check H. pylori, CMP, lipase. Trigger avoidance for reflux discussed, and RTC precautions if persistent or worsening.   Internal hemorrhoids BRBPR (bright red blood per rectum) - Plan: POCT CBC  -No acute  bleeding or external hemorrhoids seen on exam. Avoid straining, stool softener as needed to lessen chance of constipation, and if persistent, follow-up with gastroenterology to decide if repeat banding needed  No orders of the defined types were placed in this encounter.   Patient Instructions       IF you received an x-ray today, you will receive an invoice from [facility]. Please contact [facility] at [phone] with questions or concerns regarding your invoice.   IF you received labwork today, you will receive an invoice from [employer]. Please contact [facility] at [phone] with questions or concerns regarding your invoice.   Our billing staff will not be able to assist you with questions regarding bills from these companies.  You will be contacted with the lab results as soon as they are available. The fastest way to get your results is to activate your My Chart account. Instructions are located on the last page of this paperwork. If you have not heard from [place] regarding the results in 2 weeks, please contact this office.    Your internal hemorrhoids are likely the cause of the recent bleeding. See information below to help prevent hemorrhoid flare, but if these persist, would recommend you call your gastroenterologist as repeat banding may be needed.  Your abdominal exam is reassuring tonight. We  will check some liver tests, pancreas test, and test for H. pylori bacteria that can be associated with ulcers, but for right now continue your Prilosec and avoid the foods below that are known to be associated with heartburn. If any worsening of symptoms, return here or emergency room if needed.   Food Choices for Gastroesophageal Reflux Disease, Adult When you have gastroesophageal reflux disease (GERD), the foods you eat and your eating habits are very important. Choosing the right foods can help ease the discomfort of GERD. WHAT GENERAL GUIDELINES DO I NEED TO FOLLOW?  Choose fruits, vegetables, whole grains, low-fat dairy products, and low-fat meat, fish, and poultry.  Limit fats such as oils, salad dressings, butter, nuts, and avocado.  Keep a food diary to identify foods that cause symptoms.  Avoid foods that cause reflux. These may be different for different people.  Eat frequent small meals instead of three large meals each day.  Eat your meals slowly, in a relaxed setting.  Limit fried foods.  Cook foods using methods other than frying.  Avoid drinking alcohol.  Avoid drinking large amounts of liquids with your meals.  Avoid bending over or lying down until 2-3 hours after eating. WHAT FOODS ARE NOT RECOMMENDED? The following are some foods and drinks that may worsen your symptoms: Vegetables Tomatoes. Tomato juice. Tomato and spaghetti sauce. Chili peppers. Onion and garlic. Horseradish. Fruits Oranges, grapefruit, and lemon (fruit and juice). Meats High-fat meats, fish, and poultry. This includes hot dogs, ribs, ham, sausage, salami, and bacon. Dairy Whole milk and chocolate milk. Sour cream. Cream. Butter. Ice cream. Cream cheese.  Beverages Coffee and tea, with or without caffeine. Carbonated beverages or energy drinks. Condiments Hot sauce. Barbecue sauce.  Sweets/Desserts Chocolate and cocoa. Donuts. Peppermint and spearmint. Fats and Oils High-fat foods,  including [place] fries and potato chips. Other Vinegar. Strong spices, such as black pepper, white pepper, red pepper, cayenne, curry powder, cloves, ginger, and chili powder. The items listed above may not be a complete list of foods and beverages to avoid. Contact your dietitian for more information.   This information is not intended to replace advice given to  you by your health care provider. Make sure you discuss any questions you have with your health care provider.   Document Released: 05/19/2005 Document Revised: 06/09/2014 Document Reviewed: 03/23/2013 Elsevier Interactive Patient Education 2016 Elsevier Inc. Abdominal Pain, Adult Many things can cause abdominal pain. Usually, abdominal pain is not caused by a disease and will improve without treatment. It can often be observed and treated at home. Your health care provider will do a physical exam and possibly order blood tests and X-rays to help determine the seriousness of your pain. However, in many cases, more time must pass before a clear cause of the pain can be found. Before that point, your health care provider may not know if you need more testing or further treatment. HOME CARE INSTRUCTIONS Monitor your abdominal pain for any changes. The following actions may help to alleviate any discomfort you are experiencing:  Only take over-the-counter or prescription medicines as directed by your health care provider.  Do not take laxatives unless directed to do so by your health care provider.  Try a clear liquid diet (broth, tea, or water) as directed by your health care provider. Slowly move to a bland diet as tolerated. SEEK MEDICAL CARE IF:  You have unexplained abdominal pain.  You have abdominal pain associated with nausea or diarrhea.  You have pain when you urinate or have a bowel movement.  You experience abdominal pain that wakes you in the night.  You have abdominal pain that is worsened or improved by eating  food.  You have abdominal pain that is worsened with eating fatty foods.  You have a fever. SEEK IMMEDIATE MEDICAL CARE IF:  Your pain does not go away within 2 hours.  You keep throwing up (vomiting).  Your pain is felt only in portions of the abdomen, such as the right side or the left lower portion of the abdomen.  You pass bloody or black tarry stools. MAKE SURE YOU:  Understand these instructions.  Will watch your condition.  Will get help right away if you are not doing well or get worse.   This information is not intended to replace advice given to you by your health care provider. Make sure you discuss any questions you have with your health care provider.   Document Released: 02/26/2005 Document Revised: 02/07/2015 Document Reviewed: 01/26/2013 Elsevier Interactive Patient Education 2016 [employer].  Hemorrhoids Hemorrhoids are swollen veins around the rectum or anus. There are two types of hemorrhoids:   Internal hemorrhoids. These occur in the veins just inside the rectum. They may poke through to the outside and become irritated and painful.  External hemorrhoids. These occur in the veins outside the anus and can be felt as a painful swelling or hard lump near the anus. CAUSES  Pregnancy.   Obesity.   Constipation or diarrhea.   Straining to have a bowel movement.   Sitting for long periods on the toilet.  Heavy lifting or other activity that caused you to strain.  Anal intercourse. SYMPTOMS   Pain.   Anal itching or irritation.   Rectal bleeding.   Fecal leakage.   Anal swelling.   One or more lumps around the anus.  DIAGNOSIS  Your caregiver may be able to diagnose hemorrhoids by visual examination. Other examinations or tests that may be performed include:   Examination of the rectal area with a gloved hand (digital rectal exam).   Examination of anal canal using a small tube (scope).   A blood  test if you have lost a  significant amount of blood.  A test to look inside the colon (sigmoidoscopy or colonoscopy). TREATMENT Most hemorrhoids can be treated at home. However, if symptoms do not seem to be getting better or if you have a lot of rectal bleeding, your caregiver may perform a procedure to help make the hemorrhoids get smaller or remove them completely. Possible treatments include:   Placing a rubber band at the base of the hemorrhoid to cut off the circulation (rubber band ligation).   Injecting a chemical to shrink the hemorrhoid (sclerotherapy).   Using a tool to burn the hemorrhoid (infrared light therapy).   Surgically removing the hemorrhoid (hemorrhoidectomy).   Stapling the hemorrhoid to block blood flow to the tissue (hemorrhoid stapling).  HOME CARE INSTRUCTIONS   Eat foods with fiber, such as whole grains, beans, nuts, fruits, and vegetables. Ask your doctor about taking products with added fiber in them (fibersupplements).  Increase fluid intake. Drink enough water and fluids to keep your urine clear or pale yellow.   Exercise regularly.   Go to the bathroom when you have the urge to have a bowel movement. Do not wait.   Avoid straining to have bowel movements.   Keep the anal area dry and clean. Use wet toilet paper or moist towelettes after a bowel movement.   Medicated creams and suppositories may be used or applied as directed.   Only take over-the-counter or prescription medicines as directed by your caregiver.   Take warm sitz baths for 15-20 minutes, 3-4 times a day to ease pain and discomfort.   Place ice packs on the hemorrhoids if they are tender and swollen. Using ice packs between sitz baths may be helpful.   Put ice in a plastic bag.   Place a towel between your skin and the bag.   Leave the ice on for 15-20 minutes, 3-4 times a day.   Do not use a donut-shaped pillow or sit on the toilet for long periods. This increases blood pooling and  pain.  SEEK MEDICAL CARE IF:  You have increasing pain and swelling that is not controlled by treatment or medicine.  You have uncontrolled bleeding.  You have difficulty or you are unable to have a bowel movement.  You have pain or inflammation outside the area of the hemorrhoids. MAKE SURE YOU:  Understand these instructions.  Will watch your condition.  Will get help right away if you are not doing well or get worse.   This information is not intended to replace advice given to you by your health care provider. Make sure you discuss any questions you have with your health care provider.   Document Released: 05/16/2000 Document Revised: 05/05/2012 Document Reviewed: 03/23/2012 Elsevier Interactive Patient Education [employer].     I personally performed the services described in this documentation, which was scribed in my presence. The recorded information has been reviewed and considered, and addended by me as needed.   Signed,   [name], MD [facility].  11/28/2015 6:07 PM

## 2015-11-28 NOTE — Patient Instructions (Addendum)
IF you received an x-ray today, you will receive an invoice from [facility]. Please contact [facility] at [phone] with questions or concerns regarding your invoice.   IF you received labwork today, you will receive an invoice from [employer]. Please contact [facility] at [phone] with questions or concerns regarding your invoice.   Our billing staff will not be able to assist you with questions regarding bills from these companies.  You will be contacted with the lab results as soon as they are available. The fastest way to get your results is to activate your My Chart account. Instructions are located on the last page of this paperwork. If you have not heard from [place] regarding the results in 2 weeks, please contact this office.    Your internal hemorrhoids are likely the cause of the recent bleeding. See information below to help prevent hemorrhoid flare, but if these persist, would recommend you call your gastroenterologist as repeat banding may be needed.  Your abdominal exam is reassuring tonight. We will check some liver tests, pancreas test, and test for H. pylori bacteria that can be associated with ulcers, but for right now continue your Prilosec and avoid the foods below that are known to be associated with heartburn. If any worsening of symptoms, return here or emergency room if needed.   Food Choices for Gastroesophageal Reflux Disease, Adult When you have gastroesophageal reflux disease (GERD), the foods you eat and your eating habits are very important. Choosing the right foods can help ease the discomfort of GERD. WHAT GENERAL GUIDELINES DO I NEED TO FOLLOW?  Choose fruits, vegetables, whole grains, low-fat dairy products, and low-fat meat, fish, and poultry.  Limit fats such as oils, salad dressings, butter, nuts, and avocado.  Keep a food diary to identify foods that cause symptoms.  Avoid foods that cause reflux.  These may be different for different people.  Eat frequent small meals instead of three large meals each day.  Eat your meals slowly, in a relaxed setting.  Limit fried foods.  Cook foods using methods other than frying.  Avoid drinking alcohol.  Avoid drinking large amounts of liquids with your meals.  Avoid bending over or lying down until 2-3 hours after eating. WHAT FOODS ARE NOT RECOMMENDED? The following are some foods and drinks that may worsen your symptoms: Vegetables Tomatoes. Tomato juice. Tomato and spaghetti sauce. Chili peppers. Onion and garlic. Horseradish. Fruits Oranges, grapefruit, and lemon (fruit and juice). Meats High-fat meats, fish, and poultry. This includes hot dogs, ribs, ham, sausage, salami, and bacon. Dairy Whole milk and chocolate milk. Sour cream. Cream. Butter. Ice cream. Cream cheese.  Beverages Coffee and tea, with or without caffeine. Carbonated beverages or energy drinks. Condiments Hot sauce. Barbecue sauce.  Sweets/Desserts Chocolate and cocoa. Donuts. Peppermint and spearmint. Fats and Oils High-fat foods, including [place] fries and potato chips. Other Vinegar. Strong spices, such as black pepper, white pepper, red pepper, cayenne, curry powder, cloves, ginger, and chili powder. The items listed above may not be a complete list of foods and beverages to avoid. Contact your dietitian for more information.   This information is not intended to replace advice given to you by your health care provider. Make sure you discuss any questions you have with your health care provider.   Document Released: 05/19/2005 Document Revised: 06/09/2014 Document Reviewed: 03/23/2013 Elsevier Interactive Patient Education 2016 Elsevier Inc. Abdominal Pain, Adult Many things can cause abdominal pain. Usually, abdominal pain is  not caused by a disease and will improve without treatment. It can often be observed and treated at home. Your health care provider  will do a physical exam and possibly order blood tests and X-rays to help determine the seriousness of your pain. However, in many cases, more time must pass before a clear cause of the pain can be found. Before that point, your health care provider may not know if you need more testing or further treatment. HOME CARE INSTRUCTIONS Monitor your abdominal pain for any changes. The following actions may help to alleviate any discomfort you are experiencing:  Only take over-the-counter or prescription medicines as directed by your health care provider.  Do not take laxatives unless directed to do so by your health care provider.  Try a clear liquid diet (broth, tea, or water) as directed by your health care provider. Slowly move to a bland diet as tolerated. SEEK MEDICAL CARE IF:  You have unexplained abdominal pain.  You have abdominal pain associated with nausea or diarrhea.  You have pain when you urinate or have a bowel movement.  You experience abdominal pain that wakes you in the night.  You have abdominal pain that is worsened or improved by eating food.  You have abdominal pain that is worsened with eating fatty foods.  You have a fever. SEEK IMMEDIATE MEDICAL CARE IF:  Your pain does not go away within 2 hours.  You keep throwing up (vomiting).  Your pain is felt only in portions of the abdomen, such as the right side or the left lower portion of the abdomen.  You pass bloody or black tarry stools. MAKE SURE YOU:  Understand these instructions.  Will watch your condition.  Will get help right away if you are not doing well or get worse.   This information is not intended to replace advice given to you by your health care provider. Make sure you discuss any questions you have with your health care provider.   Document Released: 02/26/2005 Document Revised: 02/07/2015 Document Reviewed: 01/26/2013 Elsevier Interactive Patient Education 2016 [employer].  Hemorrhoids Hemorrhoids are swollen veins around the rectum or anus. There are two types of hemorrhoids:   Internal hemorrhoids. These occur in the veins just inside the rectum. They may poke through to the outside and become irritated and painful.  External hemorrhoids. These occur in the veins outside the anus and can be felt as a painful swelling or hard lump near the anus. CAUSES  Pregnancy.   Obesity.   Constipation or diarrhea.   Straining to have a bowel movement.   Sitting for long periods on the toilet.  Heavy lifting or other activity that caused you to strain.  Anal intercourse. SYMPTOMS   Pain.   Anal itching or irritation.   Rectal bleeding.   Fecal leakage.   Anal swelling.   One or more lumps around the anus.  DIAGNOSIS  Your caregiver may be able to diagnose hemorrhoids by visual examination. Other examinations or tests that may be performed include:   Examination of the rectal area with a gloved hand (digital rectal exam).   Examination of anal canal using a small tube (scope).   A blood test if you have lost a significant amount of blood.  A test to look inside the colon (sigmoidoscopy or colonoscopy). TREATMENT Most hemorrhoids can be treated at home. However, if symptoms do not seem to be getting better or if you have a lot of rectal bleeding, your  caregiver may perform a procedure to help make the hemorrhoids get smaller or remove them completely. Possible treatments include:   Placing a rubber band at the base of the hemorrhoid to cut off the circulation (rubber band ligation).   Injecting a chemical to shrink the hemorrhoid (sclerotherapy).   Using a tool to burn the hemorrhoid (infrared light therapy).   Surgically removing the hemorrhoid (hemorrhoidectomy).   Stapling the hemorrhoid to block blood flow to the tissue (hemorrhoid stapling).  HOME CARE INSTRUCTIONS   Eat foods with fiber, such as whole grains,  beans, nuts, fruits, and vegetables. Ask your doctor about taking products with added fiber in them (fibersupplements).  Increase fluid intake. Drink enough water and fluids to keep your urine clear or pale yellow.   Exercise regularly.   Go to the bathroom when you have the urge to have a bowel movement. Do not wait.   Avoid straining to have bowel movements.   Keep the anal area dry and clean. Use wet toilet paper or moist towelettes after a bowel movement.   Medicated creams and suppositories may be used or applied as directed.   Only take over-the-counter or prescription medicines as directed by your caregiver.   Take warm sitz baths for 15-20 minutes, 3-4 times a day to ease pain and discomfort.   Place ice packs on the hemorrhoids if they are tender and swollen. Using ice packs between sitz baths may be helpful.   Put ice in a plastic bag.   Place a towel between your skin and the bag.   Leave the ice on for 15-20 minutes, 3-4 times a day.   Do not use a donut-shaped pillow or sit on the toilet for long periods. This increases blood pooling and pain.  SEEK MEDICAL CARE IF:  You have increasing pain and swelling that is not controlled by treatment or medicine.  You have uncontrolled bleeding.  You have difficulty or you are unable to have a bowel movement.  You have pain or inflammation outside the area of the hemorrhoids. MAKE SURE YOU:  Understand these instructions.  Will watch your condition.  Will get help right away if you are not doing well or get worse.   This information is not intended to replace advice given to you by your health care provider. Make sure you discuss any questions you have with your health care provider.   Document Released: 05/16/2000 Document Revised: 05/05/2012 Document Reviewed: 03/23/2012 Elsevier Interactive Patient Education [employer].

## 2015-11-29 LAB — COMPLETE METABOLIC PANEL WITH GFR
ALT: 26 U/L (ref 9–46)
AST: 26 U/L (ref 10–35)
Albumin: 5 g/dL (ref 3.6–5.1)
Alkaline Phosphatase: 29 U/L — ABNORMAL LOW (ref 40–115)
BUN: 16 mg/dL (ref 7–25)
CO2: 31 mmol/L (ref 20–31)
Calcium: 9.8 mg/dL (ref 8.6–10.3)
Chloride: 100 mmol/L (ref 98–110)
Creat: 1.19 mg/dL (ref 0.70–1.33)
GFR, Est African American: 81 mL/min (ref >=60)
GFR, Est Non African American: 70 mL/min (ref >=60)
Glucose, Bld: 70 mg/dL (ref 65–99)
Potassium: 4.4 mmol/L (ref 3.5–5.3)
Sodium: 139 mmol/L (ref 135–146)
Total Bilirubin: 1.3 mg/dL — ABNORMAL HIGH (ref 0.2–1.2)
Total Protein: 8.2 g/dL — ABNORMAL HIGH (ref 6.1–8.1)

## 2015-11-29 LAB — H. PYLORI BREATH TEST: H. pylori Breath Test: NOT DETECTED

## 2015-11-29 LAB — LIPASE: Lipase: 63 U/L — ABNORMAL HIGH (ref 7–60)

## 2015-12-13 ENCOUNTER — Telehealth: Payer: Self-pay

## 2015-12-13 NOTE — Telephone Encounter (Signed)
Pt returned call.  Advised of Dr. Vonna Kotyk message.   Pt was going to come in today for labs.  Discussed with Dr. Carlota Raspberry and if pt is symptomatic he will need to have an appointment with labs. If pt is asymptomatic  - then he can come in for labs only.   LMOVM return call for pt to let us know if he is symptomatic -- if yes, we will advise how to get an appointment with Dr. Carlota Raspberry.  If asymptomatic, will advise Dr. Carlota Raspberry and he will put orders in [facility]

## 2015-12-13 NOTE — Telephone Encounter (Signed)
Pt returned call - having continued abdominal pain but not as severe as prior.  Pt will call for appt after 4 today for appt with Justin Farley tomorrow Fri 7/14.

## 2015-12-13 NOTE — Telephone Encounter (Signed)
-----   Message from Wendie Agreste, MD sent at 12/10/2015 11:46 AM EDT ----- Call patient.  Test for bacteria associated with stomach ulcers was negative.  Bilirubin or liver tests was borderline elevated, as well as total protein. Pancreas test was also borderline elevated. Recommend follow-up to recheck these labs in the next 2 weeks, sooner if worse. Let me know if there are any questions.

## 2015-12-13 NOTE — Telephone Encounter (Signed)
LMOVM for pt to return call to clinic

## 2015-12-14 ENCOUNTER — Ambulatory Visit (INDEPENDENT_AMBULATORY_CARE_PROVIDER_SITE_OTHER): Payer: 59 | Admitting: Family Medicine

## 2015-12-14 VITALS — BP 134/80 | HR 76 | Temp 98.1°F | Resp 18 | Ht 65.0 in | Wt 154.4 lb

## 2015-12-14 DIAGNOSIS — R748 Abnormal levels of other serum enzymes: Secondary | ICD-10-CM | POA: Diagnosis not present

## 2015-12-14 DIAGNOSIS — R109 Unspecified abdominal pain: Secondary | ICD-10-CM | POA: Diagnosis not present

## 2015-12-14 DIAGNOSIS — R17 Unspecified jaundice: Secondary | ICD-10-CM | POA: Diagnosis not present

## 2015-12-14 NOTE — Progress Notes (Addendum)
Subjective:  By signing my name below, I, Justin Farley, attest that this documentation has been prepared under the direction and in the presence of [name], MD.  Electronically Signed: Thea Alken, ED Scribe. 12/14/2015. 4:46 PM.   Patient ID: Justin Farley, male    DOB: 1960/12/27, 55 y.o.   MRN: [id number]  HPI Chief Complaint  Patient presents with  . Follow-up    for lab results    HPI Comments: Justin Farley is a 55 y.o. male who presents to the [facility] for a follow up. He was seen 6/28 for abdominal pain. Noted to have hemorrhoids as likely cause of rectal bleeding advised to try PPI and advoid  GERD triggers. Overall normal CBC. Borderline lipase 63. Elevated bilirubin 1.3. Overall LFT's normal.  Pt states he has been doing since last visit. He has not had abdominal pain for the past few days. He taken prilosec a couple times a weeks as needed. He does not drink alcohol regularly. Pt denies fever, chills, nausea, emesis, decreased appetite.   Patient Active Problem List   Diagnosis Date Noted  . Internal hemorrhoids with grade 2 prolapse 11/28/2014  . HTN (hypertension) 04/19/2013  . GERD (gastroesophageal reflux disease) 04/19/2013   Past Medical History  Diagnosis Date  . Hypertension   . GERD (gastroesophageal reflux disease)   . Allergy   . Internal hemorrhoids with grade 2 prolapse 11/28/2014  . Gastritis   . Esophagitis    Past Surgical History  Procedure Laterality Date  . Wisdom tooth extraction  2009  . Hemorrhoid banding    . Colonoscopy    . Esophagogastroduodenoscopy     No Known Allergies Prior to Admission medications   Medication Sig Start Date End Date Taking? Authorizing Provider  albuterol (PROVENTIL HFA;VENTOLIN HFA) 108 (90 Base) MCG/ACT inhaler Inhale 2 puffs into the lungs every 4 (four) hours as needed for wheezing. 08/16/15  Yes Elby Beck, FNP  hydrochlorothiazide (MICROZIDE) 12.5 MG capsule TAKE 1  CAPSULE EVERY MORNING. 08/08/15  Yes Elby Beck, FNP  omeprazole (PRILOSEC) 40 MG capsule TAKE 1 CAPSULE (40 MG TOTAL) BY MOUTH DAILY. 11/25/15  Yes Elby Beck, FNP   Social History   Social History  . Marital Status: Single    Spouse Name: N/A  . Number of Children: N/A  . Years of Education: N/A   Occupational History  . Freight forwarder    Social History Main Topics  . Smoking status: Never Smoker   . Smokeless tobacco: Never Used  . Alcohol Use: No  . Drug Use: No  . Sexual Activity: Yes    Birth Control/ Protection: Condom   Other Topics Concern  . Not on file   Social History Narrative   Single. Education: [employer]. Exercise: 3 days a week for 2 hours.   Review of Systems  Constitutional: Negative for fever, chills and appetite change.  Gastrointestinal: Negative for nausea, vomiting and abdominal pain.    Objective:   Physical Exam  Constitutional: He is oriented to person, place, and time. He appears well-developed and well-nourished. No distress.  HENT:  Head: Normocephalic and atraumatic.  Eyes: Conjunctivae and EOM are normal.  Neck: Neck supple.  Cardiovascular: Normal rate.   Pulmonary/Chest: Effort normal.  Abdominal: Soft. He exhibits no distension. There is no tenderness.  Musculoskeletal: Normal range of motion.  Neurological: He is alert and oriented to person, place, and time.  Skin: Skin is warm and  dry.  Psychiatric: He has a normal mood and affect. His behavior is normal.  Nursing note and vitals reviewed.  Filed Vitals:   12/14/15 1552  BP: 134/80  Pulse: 76  Temp: 98.1 F (36.7 C)  TempSrc: Oral  Resp: 18  Height: 5\' 5"  (1.651 m)  Weight: 154 lb 6.4 oz (70.035 kg)  SpO2: 100%   Assessment & Plan:  NICHAEL REEM is a 56 y.o. male Elevated bilirubin - Plan: COMPLETE METABOLIC PANEL WITH GFR  Elevated lipase - Plan: Lipase  Abdominal pain, unspecified abdominal location - Plan: COMPLETE METABOLIC PANEL WITH  GFR, Lipase  Abdominal pain now improves, possible reflux/gastritis component that is now better. Did have borderline elevated bilirubin as well as lipase. Will recheck both of these, and if remaining elevated, ultrasound may be beneficial, or gastroenterology evaluation. RTC precautions discussed if sx's return.  No orders of the defined types were placed in this encounter.   Patient Instructions       IF you received an x-ray today, you will receive an invoice from [facility]. Please contact [facility] at [phone] with questions or concerns regarding your invoice.   IF you received labwork today, you will receive an invoice from [employer]. Please contact [facility] at [phone] with questions or concerns regarding your invoice.   Our billing staff will not be able to assist you with questions regarding bills from these companies.  You will be contacted with the lab results as soon as they are available. The fastest way to get your results is to activate your My Chart account. Instructions are located on the last page of this paperwork. If you have not heard from [place] regarding the results in 2 weeks, please contact this office.     As your symptoms are improved, no new treatments or medications needed at this time. Continue the acid blocker as needed, and avoid those foods known to cause heartburn or stomach upset. I will recheck your liver tests and pancreas tests, and if these are elevated, you may need an ultrasound of the abdomen or evaluation with a stomach specialist. If the pains return, let me know and I can refer you to a stomach specialist as we discussed.  Return to the clinic or go to the nearest emergency room if any of your symptoms worsen or new symptoms occur.  Abdominal Pain, Adult Many things can cause abdominal pain. Usually, abdominal pain is not caused by a disease and will improve without treatment. It can often be  observed and treated at home. Your health care provider will do a physical exam and possibly order blood tests and X-rays to help determine the seriousness of your pain. However, in many cases, more time must pass before a clear cause of the pain can be found. Before that point, your health care provider may not know if you need more testing or further treatment. HOME CARE INSTRUCTIONS Monitor your abdominal pain for any changes. The following actions may help to alleviate any discomfort you are experiencing:  Only take over-the-counter or prescription medicines as directed by your health care provider.  Do not take laxatives unless directed to do so by your health care provider.  Try a clear liquid diet (broth, tea, or water) as directed by your health care provider. Slowly move to a bland diet as tolerated. SEEK MEDICAL CARE IF:  You have unexplained abdominal pain.  You have abdominal pain associated with nausea or diarrhea.  You have pain  when you urinate or have a bowel movement.  You experience abdominal pain that wakes you in the night.  You have abdominal pain that is worsened or improved by eating food.  You have abdominal pain that is worsened with eating fatty foods.  You have a fever. SEEK IMMEDIATE MEDICAL CARE IF:  Your pain does not go away within 2 hours.  You keep throwing up (vomiting).  Your pain is felt only in portions of the abdomen, such as the right side or the left lower portion of the abdomen.  You pass bloody or black tarry stools. MAKE SURE YOU:  Understand these instructions.  Will watch your condition.  Will get help right away if you are not doing well or get worse.   This information is not intended to replace advice given to you by your health care provider. Make sure you discuss any questions you have with your health care provider.   Document Released: 02/26/2005 Document Revised: 02/07/2015 Document Reviewed: 01/26/2013 Elsevier  Interactive Patient Education [employer].     I personally performed the services described in this documentation, which was scribed in my presence. The recorded information has been reviewed and considered, and addended by me as needed.   Signed,   [name], MD [facility].  12/14/2015 5:11 PM

## 2015-12-14 NOTE — Patient Instructions (Addendum)
IF you received an x-ray today, you will receive an invoice from [facility]. Please contact [facility] at [phone] with questions or concerns regarding your invoice.   IF you received labwork today, you will receive an invoice from [employer]. Please contact [facility] at [phone] with questions or concerns regarding your invoice.   Our billing staff will not be able to assist you with questions regarding bills from these companies.  You will be contacted with the lab results as soon as they are available. The fastest way to get your results is to activate your My Chart account. Instructions are located on the last page of this paperwork. If you have not heard from [place] regarding the results in 2 weeks, please contact this office.     As your symptoms are improved, no new treatments or medications needed at this time. Continue the acid blocker as needed, and avoid those foods known to cause heartburn or stomach upset. I will recheck your liver tests and pancreas tests, and if these are elevated, you may need an ultrasound of the abdomen or evaluation with a stomach specialist. If the pains return, let me know and I can refer you to a stomach specialist as we discussed.  Return to the clinic or go to the nearest emergency room if any of your symptoms worsen or new symptoms occur.  Abdominal Pain, Adult Many things can cause abdominal pain. Usually, abdominal pain is not caused by a disease and will improve without treatment. It can often be observed and treated at home. Your health care provider will do a physical exam and possibly order blood tests and X-rays to help determine the seriousness of your pain. However, in many cases, more time must pass before a clear cause of the pain can be found. Before that point, your health care provider may not know if you need more testing or further treatment. HOME CARE INSTRUCTIONS Monitor your  abdominal pain for any changes. The following actions may help to alleviate any discomfort you are experiencing:  Only take over-the-counter or prescription medicines as directed by your health care provider.  Do not take laxatives unless directed to do so by your health care provider.  Try a clear liquid diet (broth, tea, or water) as directed by your health care provider. Slowly move to a bland diet as tolerated. SEEK MEDICAL CARE IF:  You have unexplained abdominal pain.  You have abdominal pain associated with nausea or diarrhea.  You have pain when you urinate or have a bowel movement.  You experience abdominal pain that wakes you in the night.  You have abdominal pain that is worsened or improved by eating food.  You have abdominal pain that is worsened with eating fatty foods.  You have a fever. SEEK IMMEDIATE MEDICAL CARE IF:  Your pain does not go away within 2 hours.  You keep throwing up (vomiting).  Your pain is felt only in portions of the abdomen, such as the right side or the left lower portion of the abdomen.  You pass bloody or black tarry stools. MAKE SURE YOU:  Understand these instructions.  Will watch your condition.  Will get help right away if you are not doing well or get worse.   This information is not intended to replace advice given to you by your health care provider. Make sure you discuss any questions you have with your health care provider.   Document Released: 02/26/2005 Document Revised: 02/07/2015  Document Reviewed: 01/26/2013 Elsevier Interactive Patient Education [employer].

## 2015-12-15 LAB — COMPLETE METABOLIC PANEL WITH GFR
ALT: 30 U/L (ref 9–46)
AST: 27 U/L (ref 10–35)
Albumin: 4.8 g/dL (ref 3.6–5.1)
Alkaline Phosphatase: 28 U/L — ABNORMAL LOW (ref 40–115)
BUN: 12 mg/dL (ref 7–25)
CO2: 28 mmol/L (ref 20–31)
Calcium: 9.3 mg/dL (ref 8.6–10.3)
Chloride: 104 mmol/L (ref 98–110)
Creat: 1.22 mg/dL (ref 0.70–1.33)
GFR, Est African American: 78 mL/min (ref >=60)
GFR, Est Non African American: 68 mL/min (ref >=60)
Glucose, Bld: 84 mg/dL (ref 65–99)
Potassium: 4.2 mmol/L (ref 3.5–5.3)
Sodium: 140 mmol/L (ref 135–146)
Total Bilirubin: 2.2 mg/dL — ABNORMAL HIGH (ref 0.2–1.2)
Total Protein: 7.5 g/dL (ref 6.1–8.1)

## 2015-12-15 LAB — LIPASE: Lipase: 60 U/L (ref 7–60)

## 2015-12-22 ENCOUNTER — Ambulatory Visit (INDEPENDENT_AMBULATORY_CARE_PROVIDER_SITE_OTHER): Payer: 59 | Admitting: Family Medicine

## 2015-12-22 VITALS — BP 102/74 | HR 74 | Temp 98.0°F | Resp 18 | Ht 65.0 in | Wt 151.8 lb

## 2015-12-22 DIAGNOSIS — R1084 Generalized abdominal pain: Secondary | ICD-10-CM

## 2015-12-22 DIAGNOSIS — R17 Unspecified jaundice: Secondary | ICD-10-CM

## 2015-12-22 LAB — POCT CBC
Granulocyte percent: 43.4 %G (ref 37–80)
HCT, POC: 43.2 % — AB (ref 43.5–53.7)
Hemoglobin: 15.2 g/dL (ref 14.1–18.1)
Lymph, poc: 2.4 (ref 0.6–3.4)
MCH, POC: 29.8 pg (ref 27–31.2)
MCHC: 35.1 g/dL (ref 31.8–35.4)
MCV: 85.1 fL (ref 80–97)
MID (cbc): 0.2 (ref 0–0.9)
MPV: 7.7 fL (ref 0–99.8)
POC Granulocyte: 2 (ref 2–6.9)
POC LYMPH PERCENT: 52.3 %L — AB (ref 10–50)
POC MID %: 4.3 %M (ref 0–12)
Platelet Count, POC: 187 10*3/uL (ref 142–424)
RBC: 5.08 M/uL (ref 4.69–6.13)
RDW, POC: 13.6 %
WBC: 4.5 10*3/uL — AB (ref 4.6–10.2)

## 2015-12-22 LAB — COMPLETE METABOLIC PANEL WITH GFR
ALT: 21 U/L (ref 9–46)
AST: 20 U/L (ref 10–35)
Albumin: 4.7 g/dL (ref 3.6–5.1)
Alkaline Phosphatase: 27 U/L — ABNORMAL LOW (ref 40–115)
BUN: 12 mg/dL (ref 7–25)
CO2: 26 mmol/L (ref 20–31)
Calcium: 9.4 mg/dL (ref 8.6–10.3)
Chloride: 107 mmol/L (ref 98–110)
Creat: 1.06 mg/dL (ref 0.70–1.33)
GFR, Est African American: >89 mL/min (ref >=60)
GFR, Est Non African American: 80 mL/min (ref >=60)
Glucose, Bld: 89 mg/dL (ref 65–99)
Potassium: 4 mmol/L (ref 3.5–5.3)
Sodium: 142 mmol/L (ref 135–146)
Total Bilirubin: 1.5 mg/dL — ABNORMAL HIGH (ref 0.2–1.2)
Total Protein: 7.4 g/dL (ref 6.1–8.1)

## 2015-12-22 NOTE — Progress Notes (Signed)
By signing my name below, I, Mesha Guinyard, attest that this documentation has been prepared under the direction and in the presence of [name], MD.  Electronically Signed: Verlee Monte, Medical Scribe. 12/22/2015. 2:20 PM.  Subjective:    Patient ID: Justin Farley, male    DOB: 1961/03/10, 55 y.o.   MRN: [id number]  HPI Chief Complaint  Patient presents with  . Follow-up    Abdominal Pain    HPI Comments: Justin Farley is a 55 y.o. male who presents to the [facility] complaining of intermittent abdominal pain with most recent onset a couple of days ago. Last seen July 14th; doing well at that time. He did have elevated bili and borderline, but nl lipase but still elevated bilirubin- slightly higher than last time. Pt reports low grade fever, and chills- that was last felt 2 days ago. Pt has daily bowel movements. Pt states he still has his gallbladder, and has never has any abdominal surgeries. Pt denies vomiting, nausea, diarrhea, difficulty urinating, dysuria, and hematuria. Pt denies sick contacts.  Patient Active Problem List   Diagnosis Date Noted  . Internal hemorrhoids with grade 2 prolapse 11/28/2014  . HTN (hypertension) 04/19/2013  . GERD (gastroesophageal reflux disease) 04/19/2013   Past Medical History  Diagnosis Date  . Hypertension   . GERD (gastroesophageal reflux disease)   . Allergy   . Internal hemorrhoids with grade 2 prolapse 11/28/2014  . Gastritis   . Esophagitis    Past Surgical History  Procedure Laterality Date  . Wisdom tooth extraction  2009  . Hemorrhoid banding    . Colonoscopy    . Esophagogastroduodenoscopy     No Known Allergies Prior to Admission medications   Medication Sig Start Date End Date Taking? Authorizing Provider  albuterol (PROVENTIL HFA;VENTOLIN HFA) 108 (90 Base) MCG/ACT inhaler Inhale 2 puffs into the lungs every 4 (four) hours as needed for wheezing. 08/16/15  Yes Elby Beck, FNP    hydrochlorothiazide (MICROZIDE) 12.5 MG capsule TAKE 1 CAPSULE EVERY MORNING. 08/08/15  Yes Elby Beck, FNP  omeprazole (PRILOSEC) 40 MG capsule TAKE 1 CAPSULE (40 MG TOTAL) BY MOUTH DAILY. 11/25/15  Yes Elby Beck, FNP   Social History   Social History  . Marital Status: Single    Spouse Name: N/A  . Number of Children: N/A  . Years of Education: N/A   Occupational History  . Freight forwarder    Social History Main Topics  . Smoking status: Never Smoker   . Smokeless tobacco: Never Used  . Alcohol Use: No  . Drug Use: No  . Sexual Activity: Yes    Birth Control/ Protection: Condom   Other Topics Concern  . Not on file   Social History Narrative   Single. Education: [employer]. Exercise: 3 days a week for 2 hours.   Review of Systems  Constitutional: Positive for fever, chills and appetite change.  Gastrointestinal: Positive for abdominal pain. Negative for nausea, vomiting and diarrhea.  Genitourinary: Negative for dysuria, hematuria and difficulty urinating.    Objective:  BP 102/74 mmHg  Pulse 74  Temp(Src) 98 F (36.7 C) (Oral)  Resp 18  Ht 5\' 5"  (1.651 m)  Wt 151 lb 12.8 oz (68.856 kg)  BMI 25.26 kg/m2  SpO2 99%  Physical Exam  Constitutional: He appears well-developed and well-nourished. No distress.  HENT:  Head: Normocephalic and atraumatic.  Eyes: Conjunctivae are normal.  Neck: Neck supple.  Cardiovascular: Normal rate,  regular rhythm and normal heart sounds.   Pulmonary/Chest: Effort normal and breath sounds normal.  Abdominal: Soft. Bowel sounds are normal. He exhibits no distension. There is no tenderness. There is no guarding, no tenderness at McBurney's point and negative Murphy's sign.  Neurological: He is alert.  Skin: Skin is warm and dry.  Psychiatric: He has a normal mood and affect. His behavior is normal.  Nursing note and vitals reviewed.  Results for orders placed or performed in visit on 12/22/15  POCT CBC  Result  Value Ref Range   WBC 4.5 (A) 4.6 - 10.2 K/uL   Lymph, poc 2.4 0.6 - 3.4   POC LYMPH PERCENT 52.3 (A) 10 - 50 %L   MID (cbc) 0.2 0 - 0.9   POC MID % 4.3 0 - 12 %M   POC Granulocyte 2.0 2 - 6.9   Granulocyte percent 43.4 37 - 80 %G   RBC 5.08 4.69 - 6.13 M/uL   Hemoglobin 15.2 14.1 - 18.1 g/dL   HCT, POC 43.2 (A) 43.5 - 53.7 %   MCV 85.1 80 - 97 fL   MCH, POC 29.8 27 - 31.2 pg   MCHC 35.1 31.8 - 35.4 g/dL   RDW, POC 13.6 %   Platelet Count, POC 187 142 - 424 K/uL   MPV 7.7 0 - 99.8 fL   Assessment & Plan:   Justin Farley is a 55 y.o. male Abdominal pain, generalized - Plan: POCT CBC, COMPLETE METABOLIC PANEL WITH GFR, US Abdomen Complete  Elevated bilirubin - Plan: POCT CBC, COMPLETE METABOLIC PANEL WITH GFR, US Abdomen Complete, Bilirubin,Direct/Indirect(Fractionated)  Episodic/recurrent abdominal pain, minimal. Subjective fevers earlier this week, but none in the past 24-48 hours. Reassuring CBC, but with prior elevated bilirubin, borderline lipase, differential includes gallstones/gallbladder cause.  -Check bilirubin/CMP, with direct/indirect bilirubin.  -Check ultrasound abdomen next week, RTC precautions in the meantime if worsening.  -Continue PPI, bland foods.   No orders of the defined types were placed in this encounter.   Patient Instructions    Your blood count was actually on the lower side of normal today, less likely infection. I will recheck the liver tests, as well as schedule you for an ultrasound of the liver and abdomen next week. Continue the acid blocker once per day, bland diet, fluids and relative rest. If you have fevers that return, or worsening abdominal pain, return for recheck.     IF you received an x-ray today, you will receive an invoice from [facility] Rad[facility]. Please contact [facility] Radiology at [phone] with questions or concerns regarding your invoice.   IF you received labwork today, you will receive an invoice from [employer]. Please contact [facility] at [phone] with questions or concerns regarding your invoice.   Our billing staff will not be able to assist you with questions regarding bills from these companies.  You will be contacted with the lab results as soon as they are available. The fastest way to get your results is to activate your My Chart account. Instructions are located on the last page of this paperwork. If you have not heard from [place] regarding the results in 2 weeks, please contact this office.        I personally performed the services described in this documentation, which was scribed in my presence. The recorded information has been reviewed and considered, and addended by me as needed.   Signed,   [name], MD [facility].  12/22/2015 3:17 PM

## 2015-12-22 NOTE — Patient Instructions (Addendum)
  Your blood count was actually on the lower side of normal today, less likely infection. I will recheck the liver tests, as well as schedule you for an ultrasound of the liver and abdomen next week. Continue the acid blocker once per day, bland diet, fluids and relative rest. If you have fevers that return, or worsening abdominal pain, return for recheck.     IF you received an x-ray today, you will receive an invoice from [facility] Rad[facility]. Please contact [facility] Radiology at [phone] with questions or concerns regarding your invoice.   IF you received labwork today, you will receive an invoice from [employer]. Please contact [facility] at [phone] with questions or concerns regarding your invoice.   Our billing staff will not be able to assist you with questions regarding bills from these companies.  You will be contacted with the lab results as soon as they are available. The fastest way to get your results is to activate your My Chart account. Instructions are located on the last page of this paperwork. If you have not heard from [place] regarding the results in 2 weeks, please contact this office.

## 2015-12-24 ENCOUNTER — Telehealth: Payer: Self-pay | Admitting: Emergency Medicine

## 2015-12-24 NOTE — Telephone Encounter (Signed)
Pt called in Scheduled appointment given for [place] on 7/28 @ [facility]  Arrive at 830am. Instructed NPO after midnight.

## 2015-12-24 NOTE — Telephone Encounter (Signed)
Attempted to reach pt with scheduled [place] appointment.left message to return call Test is scheduled for [facility] on 12/28/15 @ 830am Need to be NPO 6 hours before test

## 2015-12-25 LAB — BILIRUBIN,DIRECT & INDIRECT (FRACTIONATED)
Bilirubin, Direct: 0.2 mg/dL (ref <=0.2)
Indirect Bilirubin: 1.2 mg/dL (ref 0.2–1.2)

## 2015-12-25 LAB — BILIRUBIN, TOTAL: Total Bilirubin: 1.4 mg/dL — ABNORMAL HIGH (ref 0.2–1.2)

## 2015-12-28 ENCOUNTER — Ambulatory Visit (HOSPITAL_COMMUNITY)
Admission: RE | Admit: 2015-12-28 | Discharge: 2015-12-28 | Disposition: A | Discharge status: Home / Self Care | Payer: 59 | Source: Ambulatory Visit | Attending: Family Medicine | Admitting: Family Medicine

## 2015-12-28 DIAGNOSIS — R17 Unspecified jaundice: Secondary | ICD-10-CM | POA: Diagnosis not present

## 2015-12-28 DIAGNOSIS — R1084 Generalized abdominal pain: Secondary | ICD-10-CM

## 2015-12-28 DIAGNOSIS — R935 Abnormal findings on diagnostic imaging of other abdominal regions, including retroperitoneum: Secondary | ICD-10-CM | POA: Diagnosis not present

## 2016-01-31 ENCOUNTER — Ambulatory Visit (INDEPENDENT_AMBULATORY_CARE_PROVIDER_SITE_OTHER): Payer: 59 | Admitting: Family Medicine

## 2016-01-31 ENCOUNTER — Encounter: Payer: Self-pay | Admitting: Family Medicine

## 2016-01-31 VITALS — BP 126/80 | HR 79 | Temp 98.2°F | Resp 18 | Ht 65.0 in | Wt 153.0 lb

## 2016-01-31 DIAGNOSIS — Z0000 Encounter for general adult medical examination without abnormal findings: Secondary | ICD-10-CM

## 2016-01-31 DIAGNOSIS — K2100 Gastro-esophageal reflux disease with esophagitis, without bleeding: Secondary | ICD-10-CM

## 2016-01-31 DIAGNOSIS — Z13220 Encounter for screening for lipoid disorders: Secondary | ICD-10-CM | POA: Diagnosis not present

## 2016-01-31 DIAGNOSIS — K828 Other specified diseases of gallbladder: Secondary | ICD-10-CM | POA: Diagnosis not present

## 2016-01-31 DIAGNOSIS — Z1159 Encounter for screening for other viral diseases: Secondary | ICD-10-CM

## 2016-01-31 DIAGNOSIS — Z131 Encounter for screening for diabetes mellitus: Secondary | ICD-10-CM

## 2016-01-31 DIAGNOSIS — Z125 Encounter for screening for malignant neoplasm of prostate: Secondary | ICD-10-CM

## 2016-01-31 LAB — LIPID PANEL
Cholesterol: 141 mg/dL (ref 125–200)
HDL: 73 mg/dL (ref >=40)
LDL Cholesterol: 53 mg/dL (ref <130)
Total CHOL/HDL Ratio: 1.9 Ratio (ref <=5.0)
Triglycerides: 76 mg/dL (ref <150)
VLDL: 15 mg/dL (ref <30)

## 2016-01-31 LAB — COMPLETE METABOLIC PANEL WITH GFR
ALT: 18 U/L (ref 9–46)
AST: 21 U/L (ref 10–35)
Albumin: 4.7 g/dL (ref 3.6–5.1)
Alkaline Phosphatase: 28 U/L — ABNORMAL LOW (ref 40–115)
BUN: 9 mg/dL (ref 7–25)
CO2: 25 mmol/L (ref 20–31)
Calcium: 9.6 mg/dL (ref 8.6–10.3)
Chloride: 103 mmol/L (ref 98–110)
Creat: 0.96 mg/dL (ref 0.70–1.33)
GFR, Est African American: >89 mL/min (ref >=60)
GFR, Est Non African American: >89 mL/min (ref >=60)
Glucose, Bld: 85 mg/dL (ref 65–99)
Potassium: 4 mmol/L (ref 3.5–5.3)
Sodium: 141 mmol/L (ref 135–146)
Total Bilirubin: 1.7 mg/dL — ABNORMAL HIGH (ref 0.2–1.2)
Total Protein: 7.9 g/dL (ref 6.1–8.1)

## 2016-01-31 LAB — PSA: PSA: 4 ng/mL (ref <=4.0)

## 2016-01-31 MED ORDER — OMEPRAZOLE 40 MG PO CPDR: DELAYED_RELEASE_CAPSULE | ORAL | 1 refills | Status: DC

## 2016-01-31 NOTE — Progress Notes (Signed)
By signing my name below, I, Mesha Guinyard, attest that this documentation has been prepared under the direction and in the presence of [name].  Electronically Signed: Verlee Monte, Medical Scribe. 01/31/16. 3:21 PM.  Subjective:    Patient ID: Justin Farley, male    DOB: 10-04-1960, 55 y.o.   MRN: [id number]  HPI Chief Complaint  Patient presents with  . Annual Exam    HPI Comments: Justin Farley is a 56 y.o. male with a PMHx of GERD, and HTN who [facility] his complete [facility] physical. Most recently seen for abdominal pain and bilirubin.  HTN: Takes HCTZ 12.5 mg. Pt denies experiencing any negative side effects while on this medication.  Lab Results  Component Value Date   CREATININE 1.06 12/22/2015   GERD: Takes Prilosec 40 mg QD. Endoscopy March 2015 esophagitis and gastritis. Dr. Ardis Hughs recommended continue of antacid QD.   Abdominal pain/Bilirubin: See prior visits. [place] July 28th- small amount of sludge in gallbladder. With the elevated bilirubin, was primarily indirect and overall stable compared to 2 years ago, suspected Gil[place]ts. Pt denies experiencing abdominal pain. Lab Results  Component Value Date   ALT 21 12/22/2015   AST 20 12/22/2015   ALKPHOS 27 (L) 12/22/2015   BILITOT 1.4 (H) 12/22/2015   Cancer Screening: Prostate CA: Pt would like to test for prostate CA.  Lab Results  Component Value Date   PSA 2.66 01/30/2015   PSA 2.25 02/21/2013  Colon CA: Colonoscopy Dec 2014 with Dr. Ardis Hughs, nl, repeat in 10 years.  Immunizations: Pt mentions he gets flu shots at work. Immunization History  Administered Date(s) Administered  . Influenza,inj,Quad PF,36+ Mos 03/14/2013  . Influenza-Unspecified 03/02/2014, 03/03/2015   Vision: Change in the left eye from 20/25 to 20/70 since last year. Pt's ophthalmologist visit is next week.  Visual Acuity Screening   Right eye Left eye Both eyes  Without correction:       With correction: 20/25-1 20/70 20/30   Dentist: Pt has a dentist appt 9/14  Exercise: Pt exercises 2-3 times a week. Pt denies chest pain or SOB when he exercises.  Depression: Depression screen [facility] 2/9 01/31/2016 12/22/2015 12/14/2015 11/28/2015 08/08/2015  Decreased Interest 0 0 0 0 0  Down, Depressed, Hopeless 0 0 0 0 0  PHQ - 2 Score 0 0 0 0 0   HIV/Hep C Screening: Pt has been screened for both but would like to get screened for Hep C. Pt denies being with anyone since his last HIV screening.  Patient Active Problem List   Diagnosis Date Noted  . Internal hemorrhoids with grade 2 prolapse 11/28/2014  . HTN (hypertension) 04/19/2013  . GERD (gastroesophageal reflux disease) 04/19/2013   Past Medical History:  Diagnosis Date  . Allergy   . Esophagitis   . Gastritis   . GERD (gastroesophageal reflux disease)   . Hypertension   . Internal hemorrhoids with grade 2 prolapse 11/28/2014   Past Surgical History:  Procedure Laterality Date  . COLONOSCOPY    . ESOPHAGOGASTRODUODENOSCOPY    . [place] EXTRACTION  2009   No Known Allergies Prior to Admission medications   Medication Sig Start Date End Date Taking? Authorizing Provider  albuterol (PROVENTIL HFA;VENTOLIN HFA) 108 (90 Base) MCG/ACT inhaler Inhale 2 puffs into the lungs every 4 (four) hours as needed for wheezing. 08/16/15  Yes Elby Beck, FNP  hydrochlorothiazide (MICROZIDE) 12.5 MG capsule  TAKE 1 CAPSULE EVERY MORNING. 08/08/15  Yes Elby Beck, FNP  omeprazole (PRILOSEC) 40 MG capsule TAKE 1 CAPSULE (40 MG TOTAL) BY MOUTH DAILY. 11/25/15  Yes Elby Beck, FNP   Social History   Social History  . Marital status: Single    Spouse name: N/A  . Number of children: N/A  . Years of education: N/A   Occupational History  . Freight forwarder    Social History Main Topics  . Smoking status: Never Smoker  . Smokeless tobacco: Never Used  . Alcohol use No  . Drug use: No  .  Sexual activity: Yes    Birth control/ protection: Condom   Other Topics Concern  . Not on file   Social History Narrative   Single. Education: [employer]. Exercise: 3 days a week for 2 hours.   Review of Systems  13 point ROS negative. Objective:  Physical Exam  Constitutional: He is oriented to person, place, and time. He appears well-developed and well-nourished.  HENT:  Head: Normocephalic and atraumatic.  Right Ear: External ear normal.  Left Ear: External ear normal.  Mouth/Throat: Oropharynx is clear and moist.  Eyes: Conjunctivae and EOM are normal. Pupils are equal, round, and reactive to light.  Neck: Normal range of motion. Neck supple. No thyromegaly present.  Cardiovascular: Normal rate, regular rhythm, normal heart sounds and intact distal pulses.   Pulmonary/Chest: Effort normal and breath sounds normal. No respiratory distress. He has no wheezes.  Abdominal: Soft. He exhibits no distension. There is no tenderness. Hernia confirmed negative in the right inguinal area and confirmed negative in the left inguinal area.  Genitourinary: Prostate normal.  Musculoskeletal: Normal range of motion. He exhibits no edema or tenderness.  Lymphadenopathy:    He has no cervical adenopathy.  Neurological: He is alert and oriented to person, place, and time. He has normal reflexes.  Skin: Skin is warm and dry.  Psychiatric: He has a normal mood and affect. His behavior is normal.  Vitals reviewed.  BP 126/80 (BP Location: Right Arm, Patient Position: Sitting, Cuff Size: Small)   Pulse 79   Temp 98.2 F (36.8 C) (Oral)   Resp 18   Ht 5\' 5"  (1.651 m)   Wt 153 lb (69.4 kg)   SpO2 99%   BMI 25.46 kg/m    Assessment & Plan:   Justin Farley is a 55 y.o. male Annual physical exam  --anticipatory guidance as below in AVS, screening labs above. Health maintenance items as above in HPI discussed/recommended as applicable.   Gallbladder sludge  - Symptomatic. If any  recurrence of abdominal pain, consider HIDA scan or gastroenterology evaluation.  Gastroesophageal reflux disease with esophagitis - Plan: omeprazole (PRILOSEC) 40 MG capsule  -Stable. Continue omeprazole and trigger avoidance.  Need for hepatitis C screening test - Plan: Hepatitis C antibody  Screening for diabetes mellitus - cmp.   Screening for hyperlipidemia - Plan: COMPLETE METABOLIC PANEL WITH GFR, Lipid panel  Screening for prostate cancer - Plan: PSA  -We discussed pros and cons of prostate cancer screening, and after this discussion, he chose to have screening done. PSA obtained, and no concerning findings on DRE.    Meds ordered this encounter  Medications  . omeprazole (PRILOSEC) 40 MG capsule    Sig: TAKE 1 CAPSULE (40 MG TOTAL) BY MOUTH DAILY.    Dispense:  90 capsule    Refill:  1   Patient Instructions   If abdominal pain returns, would recommend  evaluation with gastroenterology or general surgeon as there was some gallbladder sludge without acute infection noted on your ultrasound. You may need to have other gallbladder function testing.  Keeping you healthy  Get these tests  Blood pressure- Have your blood pressure checked once a year by your healthcare provider.  Normal blood pressure is 120/80  Weight- Have your body mass index (BMI) calculated to screen for obesity.  BMI is a measure of body fat based on height and weight. You can also calculate your own BMI at [email or web address].  Cholesterol- Have your cholesterol checked every year.  Diabetes- Have your blood sugar checked regularly if you have high blood pressure, high cholesterol, have a family history of diabetes or if you are overweight.  Screening for Colon Cancer- Colonoscopy starting at age 51.  Screening may begin sooner depending on your family history and other health conditions. Follow up colonoscopy as directed by your Gastroenterologist.  Screening for Prostate Cancer- Both blood work  (PSA) and a rectal exam help screen for Prostate Cancer.  Screening begins at age 58 with African-American men and at age 50 with Caucasian men.  Screening may begin sooner depending on your family history.  Take these medicines  Aspirin- One aspirin daily can help prevent Heart disease and Stroke.  Flu shot- Every fall.  Tetanus- Every 10 years.  Zostavax- Once after the age of 4 to prevent Shingles.  Pneumonia shot- Once after the age of 56; if you are younger than 1, ask your healthcare provider if you need a Pneumonia shot.  Take these steps  Don't smoke- If you do smoke, talk to your doctor about quitting.  For tips on how to quit, go to www.smokefree.gov or call 1-800-QUIT-NOW.  Be physically active- Exercise 5 days a week for at least 30 minutes.  If you are not already physically active start slow and gradually work up to 30 minutes of moderate physical activity.  Examples of moderate activity include walking briskly, mowing the yard, dancing, swimming, bicycling, etc.  Eat a healthy diet- Eat a variety of healthy food such as fruits, vegetables, low fat milk, low fat cheese, yogurt, lean meant, poultry, fish, beans, tofu, etc. For more information go to www.thenutritionsource.org  Drink alcohol in moderation- Limit alcohol intake to less than two drinks a day. Never drink and drive.  Dentist- Brush and floss twice daily; visit your dentist twice a year.  Depression- Your emotional health is as important as your physical health. If you're feeling down, or losing interest in things you would normally enjoy please talk to your healthcare provider.  Eye exam- Visit your eye doctor every year.  Safe sex- If you may be exposed to a sexually transmitted infection, use a condom.  Seat belts- Seat belts can save your life; always wear one.  Smoke/Carbon Monoxide detectors- These detectors need to be installed on the appropriate level of your home.  Replace batteries at least once  a year.  Skin cancer- When out in the sun, cover up and use sunscreen 15 SPF or higher.  Violence- If anyone is threatening you, please tell your healthcare provider.  Living Will/ Health care power of attorney- Speak with your healthcare provider and family.    IF you received an x-ray today, you will receive an invoice from [facility] Radiology. Please contact [facility] Radiology at [phone] with questions or concerns regarding your invoice.   IF you received labwork today, you will receive an invoice from [employer]. Please contact  [facility] at [phone] with questions or concerns regarding your invoice.   Our billing staff will not be able to assist you with questions regarding bills from these companies.  You will be contacted with the lab results as soon as they are available. The fastest way to get your results is to activate your My Chart account. Instructions are located on the last page of this paperwork. If you have not heard from [place] regarding the results in 2 weeks, please contact this office.       I personally performed the services described in this documentation, which was scribed in my presence. The recorded information has been reviewed and considered, and addended by me as needed.   Signed,   [name], MD [facility].  02/02/16 5:46 PM

## 2016-01-31 NOTE — Patient Instructions (Addendum)
If abdominal pain returns, would recommend evaluation with gastroenterology or general surgeon as there was some gallbladder sludge without acute infection noted on your ultrasound. You may need to have other gallbladder function testing.  Keeping you healthy  Get these tests  Blood pressure- Have your blood pressure checked once a year by your healthcare provider.  Normal blood pressure is 120/80  Weight- Have your body mass index (BMI) calculated to screen for obesity.  BMI is a measure of body fat based on height and weight. You can also calculate your own BMI at [email or web address].  Cholesterol- Have your cholesterol checked every year.  Diabetes- Have your blood sugar checked regularly if you have high blood pressure, high cholesterol, have a family history of diabetes or if you are overweight.  Screening for Colon Cancer- Colonoscopy starting at age 86.  Screening may begin sooner depending on your family history and other health conditions. Follow up colonoscopy as directed by your Gastroenterologist.  Screening for Prostate Cancer- Both blood work (PSA) and a rectal exam help screen for Prostate Cancer.  Screening begins at age 29 with African-American men and at age 40 with Caucasian men.  Screening may begin sooner depending on your family history.  Take these medicines  Aspirin- One aspirin daily can help prevent Heart disease and Stroke.  Flu shot- Every fall.  Tetanus- Every 10 years.  Zostavax- Once after the age of 36 to prevent Shingles.  Pneumonia shot- Once after the age of 46; if you are younger than 21, ask your healthcare provider if you need a Pneumonia shot.  Take these steps  Don't smoke- If you do smoke, talk to your doctor about quitting.  For tips on how to quit, go to www.smokefree.gov or call 1-800-QUIT-NOW.  Be physically active- Exercise 5 days a week for at least 30 minutes.  If you are not already physically active start slow and gradually work  up to 30 minutes of moderate physical activity.  Examples of moderate activity include walking briskly, mowing the yard, dancing, swimming, bicycling, etc.  Eat a healthy diet- Eat a variety of healthy food such as fruits, vegetables, low fat milk, low fat cheese, yogurt, lean meant, poultry, fish, beans, tofu, etc. For more information go to www.thenutritionsource.org  Drink alcohol in moderation- Limit alcohol intake to less than two drinks a day. Never drink and drive.  Dentist- Brush and floss twice daily; visit your dentist twice a year.  Depression- Your emotional health is as important as your physical health. If you're feeling down, or losing interest in things you would normally enjoy please talk to your healthcare provider.  Eye exam- Visit your eye doctor every year.  Safe sex- If you may be exposed to a sexually transmitted infection, use a condom.  Seat belts- Seat belts can save your life; always wear one.  Smoke/Carbon Monoxide detectors- These detectors need to be installed on the appropriate level of your home.  Replace batteries at least once a year.  Skin cancer- When out in the sun, cover up and use sunscreen 15 SPF or higher.  Violence- If anyone is threatening you, please tell your healthcare provider.  Living Will/ Health care power of attorney- Speak with your healthcare provider and family.    IF you received an x-ray today, you will receive an invoice from [facility] Radiology. Please contact [facility] Radiology at [phone] with questions or concerns regarding your invoice.   IF you received labwork today, you will receive an invoice from [facility]. Please contact [facility] at [phone] with questions or concerns regarding your invoice.   Our billing staff will not be able to assist you with questions regarding bills from these companies.  You will be contacted with the lab results as soon as they are available. The fastest  way to get your results is to activate your My Chart account. Instructions are located on the last page of this paperwork. If you have not heard from [place] regarding the results in 2 weeks, please contact this office.

## 2016-02-01 LAB — HEPATITIS C ANTIBODY: HCV Ab: NEGATIVE

## 2016-04-21 ENCOUNTER — Telehealth: Payer: Self-pay

## 2016-04-21 NOTE — Telephone Encounter (Signed)
Pt is still having stomach issues and would like for Dr. Carlota Raspberry to go ahead and send in the referral to gastro  Best number [phone]

## 2017-01-19 ENCOUNTER — Other Ambulatory Visit: Payer: Self-pay | Admitting: Family Medicine

## 2017-01-19 DIAGNOSIS — K2100 Gastro-esophageal reflux disease with esophagitis, without bleeding: Secondary | ICD-10-CM

## 2017-03-16 ENCOUNTER — Ambulatory Visit (INDEPENDENT_AMBULATORY_CARE_PROVIDER_SITE_OTHER): Payer: 59 | Admitting: Physician Assistant

## 2017-03-16 ENCOUNTER — Encounter: Payer: Self-pay | Admitting: Physician Assistant

## 2017-03-16 VITALS — HR 74 | Temp 98.5°F | Resp 16 | Ht 64.0 in | Wt 161.0 lb

## 2017-03-16 DIAGNOSIS — L570 Actinic keratosis: Secondary | ICD-10-CM | POA: Diagnosis not present

## 2017-03-16 DIAGNOSIS — D229 Melanocytic nevi, unspecified: Secondary | ICD-10-CM | POA: Diagnosis not present

## 2017-03-16 MED ORDER — MUPIROCIN 2 % EX OINT: 1.0000 "application " | TOPICAL_OINTMENT | Freq: Two times a day (BID) | CUTANEOUS | 0 refills | Status: DC

## 2017-03-16 NOTE — Patient Instructions (Addendum)
WOUND CARE Please return to have your stitches/staples removed if you have any concerns, however they should fall out on their own in about 10 - 15 days.  Marland Kitchen Keep area clean and dry for 24 hours. Do not remove bandage, if applied. . After 24 hours, remove bandage and wash wound gently with mild soap and warm water. Reapply a new bandage after cleaning wound, if directed. . Continue daily cleansing with soap and water until stitches/staples are removed. . Do not apply any ointments or creams to the wound while stitches/staples are in place, as this may cause delayed healing. . Notify the office if you experience any of the following signs of infection: Swelling, redness, pus drainage, streaking, fever >101.0 F . Notify the office if you experience excessive bleeding that does not stop after 15-20 minutes of constant, firm pressure.      IF you received an x-ray today, you will receive an invoice from [facility]. Please contact [facility] at [phone] with questions or concerns regarding your invoice.   IF you received labwork today, you will receive an invoice from [place]. Please contact [facility] at [phone] with questions or concerns regarding your invoice.   Our billing staff will not be able to assist you with questions regarding bills from these companies.  You will be contacted with the lab results as soon as they are available. The fastest way to get your results is to activate your My Chart account. Instructions are located on the last page of this paperwork. If you have not heard from [place] regarding the results in 2 weeks, please contact this office.

## 2017-03-16 NOTE — Progress Notes (Signed)
    03/16/2017 2:47 PM   DOB: 1960-09-14 / MRN: [id number]  SUBJECTIVE:  Justin Farley is a 56 y.o. male presenting for "a bump that wont heal" on his right ribs.  This has been present for about 1 year now. He tells me the rash is slowly worsening.    He is allergic to shellfish allergy.   He  has a past medical history of Allergy; Esophagitis; Gastritis; GERD (gastroesophageal reflux disease); Hypertension; and Internal hemorrhoids with grade 2 prolapse (11/28/2014).    He  reports that he has never smoked. He has never used smokeless tobacco. He reports that he does not drink alcohol or use drugs. He  reports that he currently engages in sexual activity. He reports using the following method of birth control/protection: Condom. The patient  has a past surgical history that includes Wisdom tooth extraction (2009); Hemorrhoid banding; Colonoscopy; and Esophagogastroduodenoscopy.  His family history includes Hypertension in his mother and other.  Review of Systems  Constitutional: Negative for malaise/fatigue and weight loss.  Gastrointestinal: Negative for nausea.  Skin: Positive for rash. Negative for itching.    The problem list and medications were reviewed and updated by myself where necessary and exist elsewhere in the encounter.   OBJECTIVE:  Pulse 74   Temp 98.5 F (36.9 C) (Oral)   Resp 16   Ht 5\' 4"  (1.626 m)   Wt 161 lb (73 kg)   SpO2 99%   BMI 27.64 kg/m   Physical Exam  Constitutional: He appears well-developed. He is active and cooperative.  Non-toxic appearance.  Cardiovascular: Normal rate.   Pulmonary/Chest: Effort normal. No tachypnea.  Neurological: He is alert.  Skin: Skin is warm and dry. He is not diaphoretic. No pallor.     Vitals reviewed.  Procedure: risk and benefits of biopsy discussed with patient a verbal consent obtained. Wound prepped with alcohol and sterile drape placed. Lesion excised with a 15 blade via elliptical excision.  Wound  repaired with 4 throws using 7-0 dissolvable suture material with good approximation.     No results found for this or any previous visit (from the past 72 hour(s)).  No results found.  ASSESSMENT AND PLAN:  [name] was seen today for skin concern.  Diagnoses and all orders for this visit:  Suspicious nevus: RTC as needed as sutures are dissolvable.  Will be in touch with biopsy.  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  I think all of the margins are gone as the base of perimerter of the wound looked very normal after excision.  -     Dermatology pathology -     mupirocin ointment (BACTROBAN) 2 %; Apply 1 application topically 2 (two) times daily.    The patient is advised to call or return to clinic if he does not see an improvement in symptoms, or to seek the care of the closest emergency department if he worsens with the above plan.   Philis Fendt, MHS, PA-C [facility] 03/16/2017 2:47 PM

## 2017-03-27 ENCOUNTER — Encounter: Payer: Self-pay | Admitting: Family Medicine

## 2017-03-27 ENCOUNTER — Ambulatory Visit (INDEPENDENT_AMBULATORY_CARE_PROVIDER_SITE_OTHER): Payer: 59 | Admitting: Family Medicine

## 2017-03-27 VITALS — BP 136/84 | HR 76 | Temp 98.0°F | Resp 16 | Ht 64.57 in | Wt 165.0 lb

## 2017-03-27 DIAGNOSIS — Z91013 Allergy to seafood: Secondary | ICD-10-CM

## 2017-03-27 DIAGNOSIS — Z23 Encounter for immunization: Secondary | ICD-10-CM

## 2017-03-27 DIAGNOSIS — Z0000 Encounter for general adult medical examination without abnormal findings: Secondary | ICD-10-CM

## 2017-03-27 DIAGNOSIS — Z114 Encounter for screening for human immunodeficiency virus [HIV]: Secondary | ICD-10-CM

## 2017-03-27 DIAGNOSIS — I10 Essential (primary) hypertension: Secondary | ICD-10-CM | POA: Diagnosis not present

## 2017-03-27 DIAGNOSIS — Z13220 Encounter for screening for lipoid disorders: Secondary | ICD-10-CM | POA: Diagnosis not present

## 2017-03-27 DIAGNOSIS — K2100 Gastro-esophageal reflux disease with esophagitis, without bleeding: Secondary | ICD-10-CM

## 2017-03-27 DIAGNOSIS — Z125 Encounter for screening for malignant neoplasm of prostate: Secondary | ICD-10-CM

## 2017-03-27 MED ORDER — EPINEPHRINE 0.3 MG/0.3ML IJ SOAJ: 0.3000 mg | Freq: Once | INTRAMUSCULAR | 0 refills | Status: AC

## 2017-03-27 MED ORDER — OMEPRAZOLE 40 MG PO CPDR: DELAYED_RELEASE_CAPSULE | ORAL | 1 refills | Status: DC

## 2017-03-27 MED ORDER — HYDROCHLOROTHIAZIDE 12.5 MG PO CAPS: ORAL_CAPSULE | ORAL | 3 refills | Status: DC

## 2017-03-27 NOTE — Progress Notes (Signed)
Subjective:  By signing my name below, I, Essence Howell, attest that this documentation has been prepared under the direction and in the presence of Wendie Agreste, MD Electronically Signed: Ladene Artist, ED Scribe 03/27/2017 at 8:24 AM.   Patient ID: Justin Farley, male    DOB: 1960-12-31, 56 y.o.   MRN: [id number]  Chief Complaint  Patient presents with  . Annual Exam  . Medication Refill    Hydrochlorothiazide,Prilosec   HPI Justin Farley is a 56 y.o. male who presents to Primary Care at [facility] for an annual exam. H/o HTN, GERD and hemorrhoids.   HTN HCTZ 12.5 mg qd. Controlled last visit. Pt occasionally checks his BP at home with average readings of 136/84. Lab Results  Component Value Date   CREATININE 0.96 01/31/2016   GERD Takes Prilosec 40 mg once/week. Endoscopy 07/2013 by Dr. Ardis Hughs showed esophagitis and gastritis.   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 Small amount of sludge in gallbladder on prior US  Food Allergy Allergic to shellfish with a reaction of hives and tongue swelling. He does not currently have an Epipen.   CA Screening Colonoscopy: 2014. Normal repeat in 10 years Prostate CA Screening: agrees to DRE at this visit. Lab Results  Component Value Date   PSA 4.0 01/31/2016   PSA 2.66 01/30/2015   PSA 2.25 02/21/2013   Immunizations Immunization History  Administered Date(s) Administered  . Influenza,inj,Quad PF,6+ Mos 03/14/2013, 03/27/2017  . Influenza-Unspecified 03/02/2014, 03/03/2015   Depression Screening Depression screen [facility] 2/9 03/27/2017 03/16/2017 01/31/2016 12/22/2015 12/14/2015  Decreased Interest 0 0 0 0 0  Down, Depressed, Hopeless 0 0 0 0 0  PHQ - 2 Score 0 0 0 0 0    Visual Acuity Screening   Right eye Left eye Both eyes  Without correction:     With correction: 20/50 20/50 20/20    Vision: followed by optho; wears contacts  Dentist: follows dentist regularly  Exercise: 3 days/week  Patient Active Problem  List   Diagnosis Date Noted  . Internal hemorrhoids with grade 2 prolapse 11/28/2014  . HTN (hypertension) 04/19/2013  . GERD (gastroesophageal reflux disease) 04/19/2013   Past Medical History:  Diagnosis Date  . Allergy   . Esophagitis   . Gastritis   . GERD (gastroesophageal reflux disease)   . Hypertension   . Internal hemorrhoids with grade 2 prolapse 11/28/2014   Past Surgical History:  Procedure Laterality Date  . COLONOSCOPY    . ESOPHAGOGASTRODUODENOSCOPY    . HEMORRHOID BANDING    . WISDOM TOOTH EXTRACTION  2009   Allergies  Allergen Reactions  . Shellfish Allergy    Prior to Admission medications   Medication Sig Start Date End Date Taking? Authorizing Provider  albuterol (PROVENTIL HFA;VENTOLIN HFA) 108 (90 Base) MCG/ACT inhaler Inhale 2 puffs into the lungs every 4 (four) hours as needed for wheezing. 08/16/15  Yes Elby Beck, FNP  hydrochlorothiazide (MICROZIDE) 12.5 MG capsule TAKE 1 CAPSULE EVERY MORNING. 08/08/15  Yes Elby Beck, FNP  mupirocin ointment (BACTROBAN) 2 % Apply 1 application topically 2 (two) times daily. 03/16/17  Yes Tereasa Coop, PA-C  omeprazole (PRILOSEC) 40 MG capsule TAKE 1 CAPSULE (40 MG TOTAL) BY MOUTH DAILY. 01/31/16  Yes Wendie Agreste, MD   Social History   Social History  . Marital status: Single    Spouse name: N/A  . Number of children: N/A  . Years of education: N/A   Occupational History  . Freight forwarder  Social History Main Topics  . Smoking status: Never Smoker  . Smokeless tobacco: Never Used  . Alcohol use No  . Drug use: No  . Sexual activity: Yes    Birth control/ protection: Condom   Other Topics Concern  . Not on file   Social History Narrative   Single. Education: [employer]. Exercise: 3 days a week for 2 hours.   Review of Systems  Allergic/Immunologic: Positive for food allergies.  13 point ROS negative except for above.    Objective:   Physical Exam  Constitutional: He  is oriented to person, place, and time. He appears well-developed and well-nourished.  HENT:  Head: Normocephalic and atraumatic.  Right Ear: External ear normal.  Left Ear: External ear normal.  Mouth/Throat: Oropharynx is clear and moist.  Eyes: Pupils are equal, round, and reactive to light. Conjunctivae and EOM are normal.  Neck: Normal range of motion. Neck supple. No thyromegaly present.  Cardiovascular: Normal rate, regular rhythm, normal heart sounds and intact distal pulses.   Pulmonary/Chest: Effort normal and breath sounds normal. No respiratory distress. He has no wheezes.  Abdominal: Soft. He exhibits no distension. There is no tenderness. Hernia confirmed negative in the right inguinal area and confirmed negative in the left inguinal area.  Genitourinary: Prostate normal.  Musculoskeletal: Normal range of motion. He exhibits no edema or tenderness.  Lymphadenopathy:    He has no cervical adenopathy.  Neurological: He is alert and oriented to person, place, and time. He has normal reflexes.  Skin: Skin is warm and dry.  Psychiatric: He has a normal mood and affect. His behavior is normal.  Vitals reviewed.    Vitals:   03/27/17 0809  BP: 136/84  Pulse: 76  Resp: 16  Temp: 98 F (36.7 C)  TempSrc: Oral  SpO2: 98%  Weight: 165 lb (74.8 kg)  Height: 5' 4.57" (1.64 m)      Assessment & Plan:   Justin Farley is a 56 y.o. male Annual physical exam  - -anticipatory guidance as below in AVS, screening labs above. Health maintenance items as above in HPI discussed/recommended as applicable.   Need for Tdap vaccination - Plan: Tdap vaccine greater than or equal to 7yo IM  Need for prophylactic vaccination and inoculation against influenza - Plan: Flu Vaccine QUAD 36+ mos IM  Shellfish allergy - Plan: EPINEPHrine 0.3 mg/0.3 mL IJ SOAJ injection  - EpiPen prescribed, indications for use discussed as well as need for ER follow-up if used  Essential hypertension -  Plan: Comprehensive metabolic panel, hydrochlorothiazide (MICROZIDE) 12.5 MG capsule  - Stable, continue HCTZ same dose, CMP pending.  GERD with esophagitis Gastroesophageal reflux disease with esophagitis - Plan: omeprazole (PRILOSEC) 40 MG capsule  - stable with intermittent omeprazole.  Refilled.   Screening for hyperlipidemia - Plan: Lipid panel  Screening for HIV without presence of risk factors - Plan: HIV antibody  Screening for prostate cancer - Plan: PSA  - We discussed pros and cons of prostate cancer screening, and after this discussion, he chose to have screening done. PSA obtained, and no concerning findings on DRE.    Meds ordered this encounter  Medications  . EPINEPHrine 0.3 mg/0.3 mL IJ SOAJ injection    Sig: Inject 0.3 mLs (0.3 mg total) into the muscle once.    Dispense:  1 Device    Refill:  0  . hydrochlorothiazide (MICROZIDE) 12.5 MG capsule    Sig: TAKE 1 CAPSULE EVERY MORNING.    Dispense:  90 capsule    Refill:  3  . omeprazole (PRILOSEC) 40 MG capsule    Sig: TAKE 1 CAPSULE (40 MG TOTAL) BY MOUTH DAILY.    Dispense:  90 capsule    Refill:  1   Patient Instructions   Keep Epipen available if you are accidentally exposed to shellfish and have symptoms of a reaction. If you do use the EpiPen, be seen immediately in the emergency room.   Keeping you healthy  Get these tests  Blood pressure- Have your blood pressure checked once a year by your healthcare provider.  Normal blood pressure is 120/80  Weight- Have your body mass index (BMI) calculated to screen for obesity.  BMI is a measure of body fat based on height and weight. You can also calculate your own BMI at ViewBanking.[email or web address].  Cholesterol- Have your cholesterol checked every year.  Diabetes- Have your blood sugar checked regularly if you have high blood pressure, high cholesterol, have a family history of diabetes or if you are overweight.  Screening for Colon Cancer- Colonoscopy  starting at age 69.  Screening may begin sooner depending on your family history and other health conditions. Follow up colonoscopy as directed by your Gastroenterologist.  Screening for Prostate Cancer- Both blood work (PSA) and a rectal exam help screen for Prostate Cancer.  Screening begins at age 58 with African-American men and at age 43 with Caucasian men.  Screening may begin sooner depending on your family history.  Take these medicines  Aspirin- One aspirin daily can help prevent Heart disease and Stroke.  Flu shot- Every fall.  Tetanus- Every 10 years.  Zostavax- Once after the age of 45 to prevent Shingles.  Pneumonia shot- Once after the age of 20; if you are younger than 69, ask your healthcare provider if you need a Pneumonia shot.  Take these steps  Don't smoke- If you do smoke, talk to your doctor about quitting.  For tips on how to quit, go to www.smokefree.gov or call 1-800-QUIT-NOW.  Be physically active- Exercise 5 days a week for at least 30 minutes.  If you are not already physically active start slow and gradually work up to 30 minutes of moderate physical activity.  Examples of moderate activity include walking briskly, mowing the yard, dancing, swimming, bicycling, etc.  Eat a healthy diet- Eat a variety of healthy food such as fruits, vegetables, low fat milk, low fat cheese, yogurt, lean meant, poultry, fish, beans, tofu, etc. For more information go to www.thenutritionsource.org  Drink alcohol in moderation- Limit alcohol intake to less than two drinks a day. Never drink and drive.  Dentist- Brush and floss twice daily; visit your dentist twice a year.  Depression- Your emotional health is as important as your physical health. If you're feeling down, or losing interest in things you would normally enjoy please talk to your healthcare provider.  Eye exam- Visit your eye doctor every year.  Safe sex- If you may be exposed to a sexually transmitted infection,  use a condom.  Seat belts- Seat belts can save your life; always wear one.  Smoke/Carbon Monoxide detectors- These detectors need to be installed on the appropriate level of your home.  Replace batteries at least once a year.  Skin cancer- When out in the sun, cover up and use sunscreen 15 SPF or higher.  Violence- If anyone is threatening you, please tell your healthcare provider.  Living Will/ Health care power of attorney- Speak with your healthcare provider and family.  IF you received an x-ray today, you will receive an invoice from [facility]. Please contact [facility] at [phone] with questions or concerns regarding your invoice.   IF you received labwork today, you will receive an invoice from [place]. Please contact [facility] at [phone] with questions or concerns regarding your invoice.   Our billing staff will not be able to assist you with questions regarding bills from these companies.  You will be contacted with the lab results as soon as they are available. The fastest way to get your results is to activate your My Chart account. Instructions are located on the last page of this paperwork. If you have not heard from [place] regarding the results in 2 weeks, please contact this office.       I personally performed the services described in this documentation, which was scribed in my presence. The recorded information has been reviewed and considered for accuracy and completeness, addended by me as needed, and agree with information above.  Signed,   [name], MD [facility] at [place].  03/27/17 8:39 AM

## 2017-03-27 NOTE — Patient Instructions (Addendum)
Keep Epipen available if you are accidentally exposed to shellfish and have symptoms of a reaction. If you do use the EpiPen, be seen immediately in the emergency room.   Keeping you healthy  Get these tests  Blood pressure- Have your blood pressure checked once a year by your healthcare provider.  Normal blood pressure is 120/80  Weight- Have your body mass index (BMI) calculated to screen for obesity.  BMI is a measure of body fat based on height and weight. You can also calculate your own BMI at ViewBanking[employer][email or web address].  Cholesterol- Have your cholesterol checked every year.  Diabetes- Have your blood sugar checked regularly if you have high blood pressure, high cholesterol, have a family history of diabetes or if you are overweight.  Screening for Colon Cancer- Colonoscopy starting at age 54.  Screening may begin sooner depending on your family history and other health conditions. Follow up colonoscopy as directed by your Gastroenterologist.  Screening for Prostate Cancer- Both blood work (PSA) and a rectal exam help screen for Prostate Cancer.  Screening begins at age 54 with African-American men and at age 54 with Caucasian men.  Screening may begin sooner depending on your family history.  Take these medicines  Aspirin- One aspirin daily can help prevent Heart disease and Stroke.  Flu shot- Every fall.  Tetanus- Every 10 years.  Zostavax- Once after the age of 54 to prevent Shingles.  Pneumonia shot- Once after the age of 54; if you are younger than 40, ask your healthcare provider if you need a Pneumonia shot.  Take these steps  Don't smoke- If you do smoke, talk to your doctor about quitting.  For tips on how to quit, go to www.smokefree.gov or call 1-800-QUIT-NOW.  Be physically active- Exercise 5 days a week for at least 30 minutes.  If you are not already physically active start slow and gradually work up to 30 minutes of moderate physical activity.  Examples of  moderate activity include walking briskly, mowing the yard, dancing, swimming, bicycling, etc.  Eat a healthy diet- Eat a variety of healthy food such as fruits, vegetables, low fat milk, low fat cheese, yogurt, lean meant, poultry, fish, beans, tofu, etc. For more information go to www.thenutritionsource.org  Drink alcohol in moderation- Limit alcohol intake to less than two drinks a day. Never drink and drive.  Dentist- Brush and floss twice daily; visit your dentist twice a year.  Depression- Your emotional health is as important as your physical health. If you're feeling down, or losing interest in things you would normally enjoy please talk to your healthcare provider.  Eye exam- Visit your eye doctor every year.  Safe sex- If you may be exposed to a sexually transmitted infection, use a condom.  Seat belts- Seat belts can save your life; always wear one.  Smoke/Carbon Monoxide detectors- These detectors need to be installed on the appropriate level of your home.  Replace batteries at least once a year.  Skin cancer- When out in the sun, cover up and use sunscreen 15 SPF or higher.  Violence- If anyone is threatening you, please tell your healthcare provider.  Living Will/ Health care power of attorney- Speak with your healthcare provider and family.    IF you received an x-ray today, you will receive an invoice from [facility]. Please contact [facility] at [phone] with questions or concerns regarding your invoice.   IF you received labwork today, you will receive an invoice from [facility]. Please contact [facility] at  [phone] with questions or concerns regarding your invoice.   Our billing staff will not be able to assist you with questions regarding bills from these companies.  You will be contacted with the lab results as soon as they are available. The fastest way to get your results is to activate your My Chart account. Instructions are located on  the last page of this paperwork. If you have not heard from [place] regarding the results in 2 weeks, please contact this office.

## 2017-03-28 LAB — LIPID PANEL
Chol/HDL Ratio: 2.5 ratio (ref 0.0–5.0)
Cholesterol, Total: 147 mg/dL (ref 100–199)
HDL: 58 mg/dL (ref >39)
LDL Calculated: 74 mg/dL (ref 0–99)
Triglycerides: 77 mg/dL (ref 0–149)
VLDL Cholesterol Cal: 15 mg/dL (ref 5–40)

## 2017-03-28 LAB — COMPREHENSIVE METABOLIC PANEL
ALT: 16 IU/L (ref 0–44)
AST: 23 IU/L (ref 0–40)
Albumin/Globulin Ratio: 1.8 (ref 1.2–2.2)
Albumin: 5 g/dL (ref 3.5–5.5)
Alkaline Phosphatase: 30 IU/L — ABNORMAL LOW (ref 39–117)
BUN/Creatinine Ratio: 11 (ref 9–20)
BUN: 12 mg/dL (ref 6–24)
Bilirubin Total: 1 mg/dL (ref 0.0–1.2)
CO2: 26 mmol/L (ref 20–29)
Calcium: 9.6 mg/dL (ref 8.7–10.2)
Chloride: 101 mmol/L (ref 96–106)
Creatinine, Ser: 1.09 mg/dL (ref 0.76–1.27)
GFR calc Af Amer: 88 mL/min/{1.73_m2} (ref >59)
GFR calc non Af Amer: 77 mL/min/{1.73_m2} (ref >59)
Globulin, Total: 2.8 g/dL (ref 1.5–4.5)
Glucose: 95 mg/dL (ref 65–99)
Potassium: 3.9 mmol/L (ref 3.5–5.2)
Sodium: 141 mmol/L (ref 134–144)
Total Protein: 7.8 g/dL (ref 6.0–8.5)

## 2017-03-28 LAB — PSA: Prostate Specific Ag, Serum: 3.8 ng/mL (ref 0.0–4.0)

## 2017-03-28 LAB — HIV ANTIBODY (ROUTINE TESTING W REFLEX): HIV Screen 4th Generation wRfx: NONREACTIVE

## 2017-04-06 ENCOUNTER — Telehealth: Payer: Self-pay | Admitting: Family Medicine

## 2017-04-06 NOTE — Telephone Encounter (Signed)
Pt is calling to get lab results.  Please advise  [phone]

## 2017-04-06 NOTE — Telephone Encounter (Signed)
Patient advised.

## 2017-04-06 NOTE — Progress Notes (Signed)
Lm message to return call for lab results

## 2017-04-09 ENCOUNTER — Telehealth: Payer: Self-pay | Admitting: Family Medicine

## 2017-04-09 NOTE — Telephone Encounter (Signed)
Copied from [place] #[id number]. Topic: Quick Communication - Lab Results >> Apr 09, 2017 10:58 AM Hewitt Shorts wrote: [facility] [id number] pt states he got a call to return call to office for lab results but chart states that pt was advised   Best [phone]

## 2017-04-09 NOTE — Telephone Encounter (Signed)
Results given.

## 2017-04-09 NOTE — Telephone Encounter (Signed)
Copied from [place] [id number]. Topic: Quick Communication - Lab Results >> Apr 09, 2017 11:08 AM Hewitt Shorts wrote:  [facility] [phone]

## 2017-07-29 ENCOUNTER — Other Ambulatory Visit: Payer: Self-pay

## 2017-07-29 ENCOUNTER — Encounter: Payer: Self-pay | Admitting: Family Medicine

## 2017-07-29 ENCOUNTER — Ambulatory Visit: Payer: 59 | Admitting: Family Medicine

## 2017-07-29 VITALS — BP 144/85 | HR 78 | Temp 98.9°F | Resp 17 | Ht 64.57 in | Wt 164.4 lb

## 2017-07-29 DIAGNOSIS — R059 Cough, unspecified: Secondary | ICD-10-CM

## 2017-07-29 DIAGNOSIS — R05 Cough: Secondary | ICD-10-CM | POA: Diagnosis not present

## 2017-07-29 DIAGNOSIS — J9801 Acute bronchospasm: Secondary | ICD-10-CM | POA: Diagnosis not present

## 2017-07-29 DIAGNOSIS — I10 Essential (primary) hypertension: Secondary | ICD-10-CM | POA: Diagnosis not present

## 2017-07-29 MED ORDER — ALBUTEROL SULFATE HFA 108 (90 BASE) MCG/ACT IN AERS: 2.0000 | INHALATION_SPRAY | RESPIRATORY_TRACT | 5 refills | Status: DC | PRN

## 2017-07-29 NOTE — Patient Instructions (Addendum)
   IF you received an x-ray today, you will receive an invoice from [facility]. Please contact [facility] at [phone] with questions or concerns regarding your invoice.   IF you received labwork today, you will receive an invoice from [facility]. Please contact [facility] at [phone] with questions or concerns regarding your invoice.   Our billing staff will not be able to assist you with questions regarding bills from these companies.  You will be contacted with the lab results as soon as they are available. The fastest way to get your results is to activate your My Chart account. Instructions are located on the last page of this paperwork. If you have not heard from us regarding the results in 2 weeks, please contact this office.      Bronchospasm, Adult Bronchospasm is a tightening of the airways going into the lungs. During an episode, it may be harder to breathe. You may cough, and you may make a whistling sound when you breathe (wheeze). This condition often affects people with asthma. What are the causes? This condition is caused by swelling and irritation in the airways. It can be triggered by:  An infection (common).  Seasonal allergies.  An allergic reaction.  Exercise.  Irritants. These include pollution, cigarette smoke, strong odors, aerosol sprays, and paint fumes.  Weather changes. Winds increase molds and pollens in the air. Cold air may cause swelling.  Stress and emotional upset.  What are the signs or symptoms? Symptoms of this condition include:  Wheezing. If the episode was triggered by an allergy, wheezing may start right away or hours later.  Nighttime coughing.  Frequent or severe coughing with a simple cold.  Chest tightness.  Shortness of breath.  Decreased ability to exercise.  How is this diagnosed? This condition is usually diagnosed with a review of your medical history and a physical exam. Tests, such as lung  function tests, are sometimes done to look for other conditions. The need for a chest X-ray depends on where the wheezing occurs and whether it is the first time you have wheezed. How is this treated? This condition may be treated with:  Inhaled medicines. These open up the airways and help you breathe. They can be taken with an inhaler or a nebulizer device.  Corticosteroid medicines. These may be given for severe bronchospasm, usually when it is associated with asthma.  Avoiding triggers, such as irritants, infection, or allergies.  Follow these instructions at home: Medicines  Take over-the-counter and prescription medicines only as told by your health care provider.  If you need to use an inhaler or nebulizer to take your medicine, ask your health care provider to explain how to use it correctly. If you were given a spacer, always use it with your inhaler. Lifestyle  Reduce the number of triggers in your home. To do this: ? Change your heating and air conditioning filter at least once a month. ? Limit your use of fireplaces and wood stoves. ? Do not smoke. Do not allow smoking in your home. ? Avoid using perfumes and fragrances. ? Get rid of pests, such as roaches and mice, and their droppings. ? Remove any mold from your home. ? Keep your house clean and dust free. Use unscented cleaning products. ? Replace carpet with wood, tile, or vinyl flooring. Carpet can trap dander and dust. ? Use allergy-proof pillows, mattress covers, and box spring covers. ? Wash bed sheets and blankets every week in hot water. Dry them in   a dryer. ? Use blankets that are made of polyester or cotton. ? Wash your hands often. ? Do not allow pets in your bedroom.  Avoid breathing in cold air when you exercise. General instructions  Have a plan for seeking medical care. Know when to call your health care provider and local emergency services, and where to get emergency care.  Stay up to date on your  immunizations.  When you have an episode of bronchospasm, stay calm. Try to relax and breathe more slowly.  If you have asthma, make sure you have an asthma action plan.  Keep all follow-up visits as told by your health care provider. This is important. Contact a health care provider if:  You have muscle aches.  You have chest pain.  The mucus that you cough up (sputum) changes from clear or white to yellow, green, gray, or bloody.  You have a fever.  Your sputum gets thicker. Get help right away if:  Your wheezing and coughing get worse, even after you take your prescribed medicines.  It gets even harder to breathe.  You develop severe chest pain. Summary  Bronchospasm is a tightening of the airways going into the lungs.  During an episode of bronchospasm, you may have a harder time breathing. You may cough and make a whistling sound when you breathe (wheeze).  Avoid exposure to triggers such as smoke, dust, mold, animal dander, and fragrances.  When you have an episode of bronchospasm, stay calm. Try to relax and breathe more slowly. This information is not intended to replace advice given to you by your health care provider. Make sure you discuss any questions you have with your health care provider. Document Released: 05/22/2003 Document Revised: 05/15/2016 Document Reviewed: 05/15/2016 Elsevier Interactive Patient Education  2017 Elsevier Inc.  

## 2017-07-29 NOTE — Progress Notes (Signed)
Chief Complaint  Patient presents with  . URI    x 3 weeks, tried nyquil and alka seltzer cold with some relief but still has the cough.  No fevers present, regular mb's and is eating and drinking normally    HPI Acute URI Pt with 3 weeks of cold and cough Tried nyquil and alka seltzer cold  Some relief but symptoms still ongoing No fevers or chills No diarrhea  Drinking plenty of fluids No history of asthma  Hypertension He is taking his bp meds but reports that he is taking cold meds which might be raising his bp He denies headaches, palpitations or chest pain He still sticks to a DASH diet BP Readings from Last 3 Encounters:  07/29/17 (!) 144/85  03/27/17 136/84  01/31/16 126/80     Past Medical History:  Diagnosis Date  . Allergy   . Esophagitis   . Gastritis   . GERD (gastroesophageal reflux disease)   . Hypertension   . Internal hemorrhoids with grade 2 prolapse 11/28/2014    Current Outpatient Medications  Medication Sig Dispense Refill  . albuterol (PROVENTIL HFA;VENTOLIN HFA) 108 (90 Base) MCG/ACT inhaler Inhale 2 puffs into the lungs every 4 (four) hours as needed for wheezing. 1 Inhaler 5  . hydrochlorothiazide (MICROZIDE) 12.5 MG capsule TAKE 1 CAPSULE EVERY MORNING. 90 capsule 3  . omeprazole (PRILOSEC) 40 MG capsule TAKE 1 CAPSULE (40 MG TOTAL) BY MOUTH DAILY. 90 capsule 1   No current facility-administered medications for this visit.     Allergies:  Allergies  Allergen Reactions  . Shellfish Allergy     Past Surgical History:  Procedure Laterality Date  . COLONOSCOPY    . ESOPHAGOGASTRODUODENOSCOPY    . HEMORRHOID BANDING    . WISDOM TOOTH EXTRACTION  2009    Social History   Socioeconomic History  . Marital status: Single    Spouse name: None  . Number of children: None  . Years of education: None  . Highest education level: None  Social Needs  . Financial resource strain: None  . Food insecurity - worry: None  . Food insecurity  - inability: None  . Transportation needs - medical: None  . Transportation needs - non-medical: None  Occupational History  . Occupation: Freight forwarder  Tobacco Use  . Smoking status: Never Smoker  . Smokeless tobacco: Never Used  Substance and Sexual Activity  . Alcohol use: No  . Drug use: No  . Sexual activity: Yes    Birth control/protection: Condom  Other Topics Concern  . None  Social History Narrative   Single. Education: [employer]. Exercise: 3 days a week for 2 hours.    Family History  Problem Relation Age of Onset  . Hypertension Mother   . Hypertension Other   . Colon cancer Neg Hx      ROS Review of Systems See HPI Constitution: No fevers or chills No malaise No diaphoresis Skin: No rash or itching Eyes: no blurry vision, no double vision GU: no dysuria or hematuria Neuro: no dizziness or headaches all others reviewed and negative   Objective: Vitals:   07/29/17 1613  BP: (!) 144/85  Pulse: 78  Resp: 17  Temp: 98.9 F (37.2 C)  TempSrc: Oral  SpO2: 99%  Weight: 164 lb 6.4 oz (74.6 kg)  Height: 5' 4.57" (1.64 m)    Physical Exam General: alert, oriented, in NAD Head: normocephalic, atraumatic, no sinus tenderness Eyes: EOM intact, no scleral icterus or conjunctival injection  Ears: TM clear bilaterally Nose: mucosa nonerythematous, nonedematous Throat: no pharyngeal exudate or erythema Lymph: no posterior auricular, submental or cervical lymph adenopathy Heart: normal rate, normal sinus rhythm, no murmurs Lungs: clear to auscultation bilaterally, no wheezing   Assessment and Plan Amere was seen today for uri.  Diagnoses and all orders for this visit:  Cough-  Continue cough meds Avoid stimulants Albuterol for cough  Essential hypertension- continue DASH diet and hctz  Bronchospasm- advised albuterol -     albuterol (PROVENTIL HFA;VENTOLIN HFA) 108 (90 Base) MCG/ACT inhaler; Inhale 2 puffs into the lungs every 4 (four)  hours as needed for wheezing.     [place]

## 2017-09-30 ENCOUNTER — Ambulatory Visit: Payer: 59 | Admitting: Physician Assistant

## 2017-09-30 ENCOUNTER — Encounter: Payer: Self-pay | Admitting: Physician Assistant

## 2017-09-30 ENCOUNTER — Other Ambulatory Visit: Payer: Self-pay

## 2017-09-30 VITALS — BP 112/86 | HR 66 | Temp 98.1°F | Resp 18 | Ht 64.57 in | Wt 156.2 lb

## 2017-09-30 DIAGNOSIS — R319 Hematuria, unspecified: Secondary | ICD-10-CM | POA: Diagnosis not present

## 2017-09-30 LAB — POCT URINALYSIS DIP (MANUAL ENTRY)
Bilirubin, UA: NEGATIVE
Blood, UA: NEGATIVE
Glucose, UA: NEGATIVE mg/dL
Ketones, POC UA: NEGATIVE mg/dL
Leukocytes, UA: NEGATIVE
Nitrite, UA: NEGATIVE
Protein Ur, POC: NEGATIVE mg/dL
Spec Grav, UA: 1.02 (ref 1.010–1.025)
Urobilinogen, UA: 0.2 E.U./dL
pH, UA: 7 (ref 5.0–8.0)

## 2017-09-30 LAB — POC MICROSCOPIC URINALYSIS (UMFC): Mucus: ABSENT

## 2017-09-30 NOTE — Patient Instructions (Addendum)
There is no blood in your urine at this time.  We will go ahead and complete the work-up and if I find no positive results I think it is okay to pursue watchful waiting at which she could come back in if this happens again.,     IF you received an x-ray today, you will receive an invoice from [facility]. Please contact [facility] at [phone] with questions or concerns regarding your invoice.   IF you received labwork today, you will receive an invoice from [facility]. Please contact [facility] at [phone] with questions or concerns regarding your invoice.   Our billing staff will not be able to assist you with questions regarding bills from these companies.  You will be contacted with the lab results as soon as they are available. The fastest way to get your results is to activate your My Chart account. Instructions are located on the last page of this paperwork. If you have not heard from [place] regarding the results in 2 weeks, please contact this office.

## 2017-09-30 NOTE — Progress Notes (Signed)
09/30/2017 10:36 AM   DOB: [birth year] / MRN: [id number]  SUBJECTIVE:  Justin Farley is a 57 y.o. male presenting for an episode of transient hematuria that started a few days ago and resolved with urination x1.  He denies any abdominal pain right, flank pain, perineal pain, fever, nausea, testicular pain.  He denies hematospermia.  He is a never smoker.  He has been told that he may have a kidney stone in the past.  He is allergic to shellfish allergy.   He  has a past medical history of Allergy, Esophagitis, Gastritis, GERD (gastroesophageal reflux disease), Hypertension, and Internal hemorrhoids with grade 2 prolapse (11/28/2014).    He  reports that he has never smoked. He has never used smokeless tobacco. He reports that he does not drink alcohol or use drugs. He  reports that he currently engages in sexual activity. He reports using the following method of birth control/protection: Condom. The patient  has a past surgical history that includes Wisdom tooth extraction (2009); Hemorrhoid banding; Colonoscopy; and Esophagogastroduodenoscopy.  His family history includes Hypertension in his mother and other.  Review of Systems  Constitutional: Negative for chills, diaphoresis and fever.  Eyes: Negative.   Respiratory: Negative for cough, hemoptysis, sputum production, shortness of breath and wheezing.   Cardiovascular: Negative for chest pain, orthopnea and leg swelling.  Gastrointestinal: Negative for abdominal pain, blood in stool, constipation, diarrhea, heartburn, melena, nausea and vomiting.  Genitourinary: Positive for hematuria. Negative for dysuria, flank pain, frequency and urgency.  Musculoskeletal: Negative for myalgias.  Skin: Negative for rash.  Neurological: Negative for dizziness, sensory change, speech change, focal weakness and headaches.    The problem list and medications were reviewed and updated by myself where necessary and exist elsewhere in the encounter.    OBJECTIVE:  BP 112/86 (BP Location: Left Arm, Patient Position: Sitting, Cuff Size: Normal)   Pulse 66   Temp 98.1 F (36.7 C) (Oral)   Resp 18   Ht 5' 4.57" (1.64 m)   Wt 156 lb 3.2 oz (70.9 kg)   SpO2 97%   BMI 26.34 kg/m   Physical Exam  Constitutional: He is oriented to person, place, and time. He appears well-developed. He does not appear ill.  Eyes: Pupils are equal, round, and reactive to light. Conjunctivae and EOM are normal.  Cardiovascular: Normal rate.  Pulmonary/Chest: Effort normal.  Abdominal: Soft. Bowel sounds are normal. He exhibits no distension and no mass. There is no tenderness. There is no rebound and no guarding. No hernia.  Negative for CVA tenderness bilaterally.  Negative for suprapubic tenderness.  Musculoskeletal: Normal range of motion.  Neurological: He is alert and oriented to person, place, and time. No cranial nerve deficit. Coordination normal.  Skin: Skin is warm and dry. He is not diaphoretic.  Psychiatric: He has a normal mood and affect.  Nursing note and vitals reviewed.   Results for orders placed or performed in visit on 09/30/17 (from the past 72 hour(s))  POCT urinalysis dipstick     Status: None   Collection Time: 09/30/17 10:21 AM  Result Value Ref Range   Color, UA yellow yellow   Clarity, UA clear clear   Glucose, UA negative negative mg/dL   Bilirubin, UA negative negative   Ketones, POC UA negative negative mg/dL   Spec Grav, UA 1.020 1.010 - 1.025   Blood, UA negative negative   pH, UA 7.0 5.0 - 8.0   Protein Ur, POC negative negative mg/dL  Urobilinogen, UA 0.2 0.2 or 1.0 E.U./dL   Nitrite, UA Negative Negative   Leukocytes, UA Negative Negative  POCT Microscopic Urinalysis ([facility])     Status: None   Collection Time: 09/30/17 10:27 AM  Result Value Ref Range   WBC,UR,HPF,POC None None WBC/hpf   RBC,UR,HPF,POC None None RBC/hpf   Bacteria None None, Too numerous to count   Mucus Absent Absent   Epithelial Cells,  UR Per Microscopy None None, Too numerous to count cells/hpf    No results found.  ASSESSMENT AND PLAN:  Justin Farley was seen today for hematuria.  Diagnoses and all orders for this visit:  Hematuria, unspecified type: No blood today.  Normal abdominal exam.  His HPI is reassuring.  I will screen for common causes of his work-up is negative I have advised that we pursue watchful waiting. -     POCT urinalysis dipstick -     POCT Microscopic Urinalysis ([facility]) -     PSA -     GC/Chlamydia Probe Amp -     Trichomonas vaginalis, RNA -     CBC    The patient is advised to call or return to clinic if he does not see an improvement in symptoms, or to seek the care of the closest emergency department if he worsens with the above plan.   Philis Fendt, MHS, PA-C [facility] 09/30/2017 10:36 AM

## 2017-10-01 LAB — CBC
Hematocrit: 45.7 % (ref 37.5–51.0)
Hemoglobin: 15.7 g/dL (ref 13.0–17.7)
MCH: 30.2 pg (ref 26.6–33.0)
MCHC: 34.4 g/dL (ref 31.5–35.7)
MCV: 88 fL (ref 79–97)
Platelets: 195 10*3/uL (ref 150–379)
RBC: 5.2 x10E6/uL (ref 4.14–5.80)
RDW: 14.1 % (ref 12.3–15.4)
WBC: 3.9 10*3/uL (ref 3.4–10.8)

## 2017-10-01 LAB — RENAL FUNCTION PANEL
Albumin: 4.6 g/dL (ref 3.5–5.5)
BUN/Creatinine Ratio: 10 (ref 9–20)
BUN: 12 mg/dL (ref 6–24)
CO2: 21 mmol/L (ref 20–29)
Calcium: 9.2 mg/dL (ref 8.7–10.2)
Chloride: 102 mmol/L (ref 96–106)
Creatinine, Ser: 1.16 mg/dL (ref 0.76–1.27)
GFR calc Af Amer: 82 mL/min/{1.73_m2} (ref >59)
GFR calc non Af Amer: 71 mL/min/{1.73_m2} (ref >59)
Glucose: 90 mg/dL (ref 65–99)
Phosphorus: 3 mg/dL (ref 2.5–4.5)
Potassium: 3.8 mmol/L (ref 3.5–5.2)
Sodium: 140 mmol/L (ref 134–144)

## 2017-10-01 LAB — TRICHOMONAS VAGINALIS, PROBE AMP: Trich vag by NAA: NEGATIVE

## 2017-10-01 LAB — GC/CHLAMYDIA PROBE AMP
Chlamydia trachomatis, NAA: NEGATIVE
Neisseria gonorrhoeae by PCR: NEGATIVE

## 2017-10-01 LAB — PSA: Prostate Specific Ag, Serum: 3.9 ng/mL (ref 0.0–4.0)

## 2017-10-05 ENCOUNTER — Encounter: Payer: Self-pay | Admitting: *Deleted

## 2017-10-08 DIAGNOSIS — M79644 Pain in right finger(s): Secondary | ICD-10-CM | POA: Diagnosis not present

## 2018-01-05 DIAGNOSIS — S46812A Strain of other muscles, fascia and tendons at shoulder and upper arm level, left arm, initial encounter: Secondary | ICD-10-CM | POA: Diagnosis not present

## 2018-02-07 DIAGNOSIS — M62830 Muscle spasm of back: Secondary | ICD-10-CM | POA: Diagnosis not present

## 2018-03-30 ENCOUNTER — Ambulatory Visit (INDEPENDENT_AMBULATORY_CARE_PROVIDER_SITE_OTHER): Payer: 59 | Admitting: Family Medicine

## 2018-03-30 ENCOUNTER — Encounter: Payer: Self-pay | Admitting: Family Medicine

## 2018-03-30 VITALS — BP 130/87 | HR 68 | Temp 97.7°F | Ht 64.0 in | Wt 163.2 lb

## 2018-03-30 DIAGNOSIS — K2100 Gastro-esophageal reflux disease with esophagitis, without bleeding: Secondary | ICD-10-CM

## 2018-03-30 DIAGNOSIS — Z87448 Personal history of other diseases of urinary system: Secondary | ICD-10-CM | POA: Diagnosis not present

## 2018-03-30 DIAGNOSIS — Z13220 Encounter for screening for lipoid disorders: Secondary | ICD-10-CM

## 2018-03-30 DIAGNOSIS — Z0001 Encounter for general adult medical examination with abnormal findings: Secondary | ICD-10-CM

## 2018-03-30 DIAGNOSIS — Z125 Encounter for screening for malignant neoplasm of prostate: Secondary | ICD-10-CM

## 2018-03-30 DIAGNOSIS — Z0000 Encounter for general adult medical examination without abnormal findings: Secondary | ICD-10-CM

## 2018-03-30 DIAGNOSIS — I10 Essential (primary) hypertension: Secondary | ICD-10-CM

## 2018-03-30 DIAGNOSIS — Z23 Encounter for immunization: Secondary | ICD-10-CM

## 2018-03-30 LAB — POCT URINALYSIS DIP (MANUAL ENTRY)
Bilirubin, UA: NEGATIVE
Blood, UA: NEGATIVE
Glucose, UA: NEGATIVE mg/dL
Ketones, POC UA: NEGATIVE mg/dL
Leukocytes, UA: NEGATIVE
Nitrite, UA: NEGATIVE
Protein Ur, POC: NEGATIVE mg/dL
Spec Grav, UA: 1.015 (ref 1.010–1.025)
Urobilinogen, UA: 0.2 E.U./dL
pH, UA: 7 (ref 5.0–8.0)

## 2018-03-30 LAB — POC MICROSCOPIC URINALYSIS (UMFC): Mucus: ABSENT

## 2018-03-30 MED ORDER — OMEPRAZOLE 40 MG PO CPDR: DELAYED_RELEASE_CAPSULE | ORAL | 1 refills | Status: DC

## 2018-03-30 MED ORDER — HYDROCHLOROTHIAZIDE 12.5 MG PO CAPS: ORAL_CAPSULE | ORAL | 3 refills | Status: DC

## 2018-03-30 NOTE — Progress Notes (Signed)
Shy

## 2018-03-30 NOTE — Patient Instructions (Addendum)
No change in medications today.  I will check some screening blood work including the prostate test.  If there are any changes will let you know.  Follow-up in 6 months for blood pressure, let me know if there are questions during that time.   Keeping you healthy  Get these tests  Blood pressure- Have your blood pressure checked once a year by your healthcare provider.  Normal blood pressure is 120/80  Weight- Have your body mass index (BMI) calculated to screen for obesity.  BMI is a measure of body fat based on height and weight. You can also calculate your own BMI at ViewBanking.[email or web address].  Cholesterol- Have your cholesterol checked every year.  Diabetes- Have your blood sugar checked regularly if you have high blood pressure, high cholesterol, have a family history of diabetes or if you are overweight.  Screening for Colon Cancer- Colonoscopy starting at age 72.  Screening may begin sooner depending on your family history and other health conditions. Follow up colonoscopy as directed by your Gastroenterologist.  Screening for Prostate Cancer- Both blood work (PSA) and a rectal exam help screen for Prostate Cancer.  Screening begins at age 3 with African-American men and at age 29 with Caucasian men.  Screening may begin sooner depending on your family history.  Take these medicines  Aspirin- One aspirin daily can help prevent Heart disease and Stroke.  Flu shot- Every fall.  Tetanus- Every 10 years.  Zostavax- Once after the age of 56 to prevent Shingles.  Pneumonia shot- Once after the age of 69; if you are younger than 37, ask your healthcare provider if you need a Pneumonia shot.  Take these steps  Don't smoke- If you do smoke, talk to your doctor about quitting.  For tips on how to quit, go to www.smokefree.gov or call 1-800-QUIT-NOW.  Be physically active- Exercise 5 days a week for at least 30 minutes.  If you are not already physically active start slow and  gradually work up to 30 minutes of moderate physical activity.  Examples of moderate activity include walking briskly, mowing the yard, dancing, swimming, bicycling, etc.  Eat a healthy diet- Eat a variety of healthy food such as fruits, vegetables, low fat milk, low fat cheese, yogurt, lean meant, poultry, fish, beans, tofu, etc. For more information go to www.thenutritionsource.org  Drink alcohol in moderation- Limit alcohol intake to less than two drinks a day. Never drink and drive.  Dentist- Brush and floss twice daily; visit your dentist twice a year.  Depression- Your emotional health is as important as your physical health. If you're feeling down, or losing interest in things you would normally enjoy please talk to your healthcare provider.  Eye exam- Visit your eye doctor every year.  Safe sex- If you may be exposed to a sexually transmitted infection, use a condom.  Seat belts- Seat belts can save your life; always wear one.  Smoke/Carbon Monoxide detectors- These detectors need to be installed on the appropriate level of your home.  Replace batteries at least once a year.  Skin cancer- When out in the sun, cover up and use sunscreen 15 SPF or higher.  Violence- If anyone is threatening you, please tell your healthcare provider.  Living Will/ Health care power of attorney- Speak with your healthcare provider and family.  If you have lab work done today you will be contacted with your lab results within the next 2 weeks.  If you have not heard from [place] then please contact  us. The fastest way to get your results is to register for My Chart.   IF you received an x-ray today, you will receive an invoice from [facility]. Please contact [facility] at [phone] with questions or concerns regarding your invoice.   IF you received labwork today, you will receive an invoice from [place] [facility]. Please contact [facility] at [phone] with questions or concerns regarding  your invoice.   Our billing staff will not be able to assist you with questions regarding bills from these companies.  You will be contacted with the lab results as soon as they are available. The fastest way to get your results is to activate your My Chart account. Instructions are located on the last page of this paperwork. If you have not heard from [place] regarding the results in 2 weeks, please contact this office.

## 2018-03-30 NOTE — Progress Notes (Signed)
Subjective:  By signing my name below, I, Moises Blood, attest that this documentation has been prepared under the direction and in the presence of [name], MD. Electronically Signed: Moises Blood, [place]. 03/30/2018 , 8:27 AM .  Patient was seen in Room 8 .   Patient ID: Justin Farley, male    DOB: 27-Jul-1960, 57 y.o.   MRN: [id number] Chief Complaint  Patient presents with  . Annual Exam    CPE   HPI Justin Farley is a 57 y.o. male Here for annual physical. He has a history of HTN, and GERD. Patient was treated for bronchospasm in February with albuterol and seen for hematuria in May.   Hematuria  He was seen for hematuria in May. He had a normal PSA at 3.9. STD testing was negative, and normal urinalysis without blood at that time.   GERD He takes Prilosec 40 mg once a week. He had endoscopy done by Dr. Ardis Hughs in 2015 for esophagitis.   Elevated bilirubin Suspected Gilberts, previous small amount of sludge on gall bladder on prior ultrasound.   Shellfish allergy Epi-pen prescribed last year as needed, history of hives and tongue swelling.   HTN BP Readings from Last 3 Encounters:  03/30/18 130/87  09/30/17 112/86  07/29/17 (!) 144/85   Lab Results  Component Value Date   CREATININE 1.16 09/30/2017   He takes HCTZ 12.5 mg qd. He denies lightheadedness, dizziness, chest pain, shortness of breath, blood in stool or dark tarry stool.   Cancer Screening Colonoscopy: 2014, repeat in 10 years.  Prostate cancer sceening: repeat PSA today with prior hematuria.   Immunizations Immunization History  Administered Date(s) Administered  . Influenza,inj,Quad PF,6+ Mos 03/14/2013, 03/27/2017, 03/30/2018  . Influenza-Unspecified 03/02/2014, 03/03/2015  . Tdap 03/27/2017     Depression Depression screen [facility] 2/9 03/30/2018 09/30/2017 07/29/2017 03/27/2017 03/16/2017  Decreased Interest 0 0 0 0 0  Down, Depressed, Hopeless 0 0 0 0 0  PHQ - 2 Score 0 0 0 0 0       Vision  Visual Acuity Screening   Right eye Left eye Both eyes  Without correction:     With correction: 20/20 20/40 20/20    Eye doctor: has an appointment coming up  Dentist He is followed by dentist.   Exercise He exercises about 2-3 times a week.    Patient Active Problem List   Diagnosis Date Noted  . Internal hemorrhoids with grade 2 prolapse 11/28/2014  . HTN (hypertension) 04/19/2013  . GERD (gastroesophageal reflux disease) 04/19/2013   Past Medical History:  Diagnosis Date  . Allergy   . Esophagitis   . Gastritis   . GERD (gastroesophageal reflux disease)   . Hypertension   . Internal hemorrhoids with grade 2 prolapse 11/28/2014   Past Surgical History:  Procedure Laterality Date  . COLONOSCOPY    . ESOPHAGOGASTRODUODENOSCOPY    . HEMORRHOID BANDING    . WISDOM TOOTH EXTRACTION  2009   Allergies  Allergen Reactions  . Shellfish Allergy    Prior to Admission medications   Medication Sig Start Date End Date Taking? Authorizing Provider  albuterol (PROVENTIL HFA;VENTOLIN HFA) 108 (90 Base) MCG/ACT inhaler Inhale 2 puffs into the lungs every 4 (four) hours as needed for wheezing. 07/29/17  Yes Stallings, Zoe A, MD  diclofenac (VOLTAREN) 50 MG EC tablet TAKE 1 TABLET BY MOUTH EVERY 8-12 HOURS 01/05/18  Yes [provider]  hydrochlorothiazide (MICROZIDE) 12.5 MG capsule TAKE 1 CAPSULE EVERY MORNING. 03/27/17  Yes Wendie Agreste, MD  omeprazole (PRILOSEC) 40 MG capsule TAKE 1 CAPSULE (40 MG TOTAL) BY MOUTH DAILY. 03/27/17  Yes Wendie Agreste, MD   Social History   Socioeconomic History  . Marital status: Single    Spouse name: Not on file  . Number of children: Not on file  . Years of education: Not on file  . Highest education level: Not on file  Occupational History  . Occupation: Freight forwarder  Social Needs  . Financial resource strain: Not on file  . Food insecurity:    Worry: Not on file    Inability: Not on file  .  Transportation needs:    Medical: Not on file    Non-medical: Not on file  Tobacco Use  . Smoking status: Never Smoker  . Smokeless tobacco: Never Used  Substance and Sexual Activity  . Alcohol use: No  . Drug use: No  . Sexual activity: Yes    Birth control/protection: Condom  Lifestyle  . Physical activity:    Days per week: Not on file    Minutes per session: Not on file  . Stress: Not on file  Relationships  . Social connections:    Talks on phone: Not on file    Gets together: Not on file    Attends religious service: Not on file    Active member of club or organization: Not on file    Attends meetings of clubs or organizations: Not on file    Relationship status: Not on file  . Intimate partner violence:    Fear of current or ex partner: Not on file    Emotionally abused: Not on file    Physically abused: Not on file    Forced sexual activity: Not on file  Other Topics Concern  . Not on file  Social History Narrative   Single. Education: [employer]. Exercise: 3 days a week for 2 hours.   Review of Systems 13 point ROS - negative, except for food allergies     Objective:   Physical Exam  Constitutional: He is oriented to person, place, and time. He appears well-developed and well-nourished.  HENT:  Head: Normocephalic and atraumatic.  Right Ear: External ear normal.  Left Ear: External ear normal.  Mouth/Throat: Oropharynx is clear and moist.  Eyes: Pupils are equal, round, and reactive to light. Conjunctivae and EOM are normal.  Neck: Normal range of motion. Neck supple. No thyromegaly present.  Cardiovascular: Normal rate, regular rhythm, normal heart sounds and intact distal pulses.  Pulmonary/Chest: Effort normal and breath sounds normal. No respiratory distress. He has no wheezes.  Abdominal: Soft. He exhibits no distension. There is no tenderness. Hernia confirmed negative in the right inguinal area and confirmed negative in the left inguinal area.    Genitourinary: Prostate normal.  Musculoskeletal: Normal range of motion. He exhibits no edema or tenderness.  Lymphadenopathy:    He has no cervical adenopathy.  Neurological: He is alert and oriented to person, place, and time. He has normal reflexes.  Skin: Skin is warm and dry.  Psychiatric: He has a normal mood and affect. His behavior is normal.  Vitals reviewed.    Vitals:   03/30/18 0756  BP: 130/87  Pulse: 68  Temp: 97.7 F (36.5 C)  TempSrc: Oral  SpO2: 98%  Weight: 163 lb 3.2 oz (74 kg)  Height: 5\' 4"  (1.626 m)       Assessment & Plan:   Justin Farley is a  57 y.o. male Annual physical exam  - -anticipatory guidance as below in AVS, screening labs above. Health maintenance items as above in HPI discussed/recommended as applicable.   Need for influenza vaccination - Plan: Flu Vaccine QUAD 36+ mos IM  History of hematuria - Plan: PSA, POCT urinalysis dipstick, POCT Microscopic Urinalysis ([facility]) Screening for prostate cancer - Plan: PSA  - We discussed pros and cons of prostate cancer screening, and after this discussion, he chose to have screening done.  Also with hx of hematuria, repeated testing.   Essential hypertension - Plan: Comprehensive metabolic panel, hydrochlorothiazide (MICROZIDE) 12.5 MG capsule  - Stable, tolerating current regimen. Medications refilled. Labs pending as above.   Screening for hyperlipidemia - Plan: Lipid panel   Gastroesophageal reflux disease with esophagitis - Plan: omeprazole (PRILOSEC) 40 MG capsule  - continue omeprazole same dose, trigger avoidance.  Recheck 6 months.  Meds ordered this encounter  Medications  . hydrochlorothiazide (MICROZIDE) 12.5 MG capsule    Sig: TAKE 1 CAPSULE EVERY MORNING.    Dispense:  90 capsule    Refill:  3  . omeprazole (PRILOSEC) 40 MG capsule    Sig: TAKE 1 CAPSULE (40 MG TOTAL) BY MOUTH DAILY.    Dispense:  90 capsule    Refill:  1   Patient Instructions    No change in  medications today.  I will check some screening blood work including the prostate test.  If there are any changes will let you know.  Follow-up in 6 months for blood pressure, let me know if there are questions during that time.   Keeping you healthy  Get these tests  Blood pressure- Have your blood pressure checked once a year by your healthcare provider.  Normal blood pressure is 120/80  Weight- Have your body mass index (BMI) calculated to screen for obesity.  BMI is a measure of body fat based on height and weight. You can also calculate your own BMI at [employer]Bank[employer].  Cholesterol- Have your cholesterol checked every year.  Diabetes- Have your blood sugar checked regularly if you have high blood pressure, high cholesterol, have a family history of diabetes or if you are overweight.  Screening for Colon Cancer- Colonoscopy starting at age 14.  Screening may begin sooner depending on your family history and other health conditions. Follow up colonoscopy as directed by your Gastroenterologist.  Screening for Prostate Cancer- Both blood work (PSA) and a rectal exam help screen for Prostate Cancer.  Screening begins at age 39 with African-American men and at age 36 with Caucasian men.  Screening may begin sooner depending on your family history.  Take these medicines  Aspirin- One aspirin daily can help prevent Heart disease and Stroke.  Flu shot- Every fall.  Tetanus- Every 10 years.  Zostavax- Once after the age of 38 to prevent Shingles.  Pneumonia shot- Once after the age of 37; if you are younger than 53, ask your healthcare provider if you need a Pneumonia shot.  Take these steps  Don't smoke- If you do smoke, talk to your doctor about quitting.  For tips on how to quit, go to www.smokefree.gov or call 1-800-QUIT-NOW.  Be physically active- Exercise 5 days a week for at least 30 minutes.  If you are not already physically active start slow and gradually work up to  30 minutes of moderate physical activity.  Examples of moderate activity include walking briskly, mowing the yard, dancing, swimming, bicycling, etc.  Eat a healthy diet- Eat a  variety of healthy food such as fruits, vegetables, low fat milk, low fat cheese, yogurt, lean meant, poultry, fish, beans, tofu, etc. For more information go to www.thenutritionsource.org  Drink alcohol in moderation- Limit alcohol intake to less than two drinks a day. Never drink and drive.  Dentist- Brush and floss twice daily; visit your dentist twice a year.  Depression- Your emotional health is as important as your physical health. If you're feeling down, or losing interest in things you would normally enjoy please talk to your healthcare provider.  Eye exam- Visit your eye doctor every year.  Safe sex- If you may be exposed to a sexually transmitted infection, use a condom.  Seat belts- Seat belts can save your life; always wear one.  Smoke/Carbon Monoxide detectors- These detectors need to be installed on the appropriate level of your home.  Replace batteries at least once a year.  Skin cancer- When out in the sun, cover up and use sunscreen 15 SPF or higher.  Violence- If anyone is threatening you, please tell your healthcare provider.  Living Will/ Health care power of attorney- Speak with your healthcare provider and family.  If you have lab work done today you will be contacted with your lab results within the next 2 weeks.  If you have not heard from [place] then please contact us. The fastest way to get your results is to register for My Chart.   IF you received an x-ray today, you will receive an invoice from [facility]. Please contact [facility] at [phone] with questions or concerns regarding your invoice.   IF you received labwork today, you will receive an invoice from [place]. Please contact [facility] at [phone] with questions or concerns regarding your invoice.   Our  billing staff will not be able to assist you with questions regarding bills from these companies.  You will be contacted with the lab results as soon as they are available. The fastest way to get your results is to activate your My Chart account. Instructions are located on the last page of this paperwork. If you have not heard from [place] regarding the results in 2 weeks, please contact this office.       I personally performed the services described in this documentation, which was scribed in my presence. The recorded information has been reviewed and considered for accuracy and completeness, addended by me as needed, and agree with information above.  Signed,   [name], MD [facility].  04/01/18 9:07 PM

## 2018-03-31 LAB — COMPREHENSIVE METABOLIC PANEL
ALT: 22 IU/L (ref 0–44)
AST: 25 IU/L (ref 0–40)
Albumin/Globulin Ratio: 1.6 (ref 1.2–2.2)
Albumin: 4.9 g/dL (ref 3.5–5.5)
Alkaline Phosphatase: 32 IU/L — ABNORMAL LOW (ref 39–117)
BUN/Creatinine Ratio: 9 (ref 9–20)
BUN: 10 mg/dL (ref 6–24)
Bilirubin Total: 0.7 mg/dL (ref 0.0–1.2)
CO2: 24 mmol/L (ref 20–29)
Calcium: 9.7 mg/dL (ref 8.7–10.2)
Chloride: 102 mmol/L (ref 96–106)
Creatinine, Ser: 1.17 mg/dL (ref 0.76–1.27)
GFR calc Af Amer: 81 mL/min/{1.73_m2} (ref >59)
GFR calc non Af Amer: 70 mL/min/{1.73_m2} (ref >59)
Globulin, Total: 3 g/dL (ref 1.5–4.5)
Glucose: 100 mg/dL — ABNORMAL HIGH (ref 65–99)
Potassium: 4.5 mmol/L (ref 3.5–5.2)
Sodium: 144 mmol/L (ref 134–144)
Total Protein: 7.9 g/dL (ref 6.0–8.5)

## 2018-03-31 LAB — LIPID PANEL
Chol/HDL Ratio: 3.3 ratio (ref 0.0–5.0)
Cholesterol, Total: 179 mg/dL (ref 100–199)
HDL: 55 mg/dL (ref >39)
LDL Calculated: 100 mg/dL — ABNORMAL HIGH (ref 0–99)
Triglycerides: 120 mg/dL (ref 0–149)
VLDL Cholesterol Cal: 24 mg/dL (ref 5–40)

## 2018-03-31 LAB — PSA: Prostate Specific Ag, Serum: 3.9 ng/mL (ref 0.0–4.0)

## 2018-04-13 ENCOUNTER — Encounter: Payer: Self-pay | Admitting: Radiology

## 2018-07-08 ENCOUNTER — Telehealth: Payer: Self-pay | Admitting: Family Medicine

## 2018-07-08 NOTE — Telephone Encounter (Signed)
Left a VM in regards to his appt with Dr. Carlota Raspberry on 09/27/2018. The provider will be out of the office and would need to be rescheduled.

## 2018-07-27 ENCOUNTER — Telehealth: Payer: Self-pay | Admitting: Family Medicine

## 2018-07-27 NOTE — Telephone Encounter (Signed)
LVM for pt regarding their appt with Dr. Greene on 4/30. Due to Dr. Greene being out of the office, pt will need to be rescheduled. When pt calls back, please reschedule at their convenience. Thank you! °

## 2018-08-20 ENCOUNTER — Encounter: Payer: Self-pay | Admitting: Emergency Medicine

## 2018-08-20 ENCOUNTER — Ambulatory Visit: Payer: 59 | Admitting: Emergency Medicine

## 2018-08-20 ENCOUNTER — Other Ambulatory Visit: Payer: Self-pay

## 2018-08-20 VITALS — BP 108/68 | HR 77 | Temp 98.3°F | Resp 17 | Ht 64.0 in | Wt 163.0 lb

## 2018-08-20 DIAGNOSIS — R059 Cough, unspecified: Secondary | ICD-10-CM

## 2018-08-20 DIAGNOSIS — R05 Cough: Secondary | ICD-10-CM

## 2018-08-20 NOTE — Progress Notes (Signed)
Justin Farley 58 y.o.   Chief Complaint  Patient presents with  . Cough    no travel - nonproductive x 1 month    HISTORY OF PRESENT ILLNESS: This is a 58 y.o. male complaining of dry cough for 1 month.  Denies any other symptoms.  Denies recent traveling or exposure to coronavirus.  Denies fever or chills.  Denies difficulty breathing.  No significant chronic medical problems except hypertension.  Takes low-dose hydrochlorothiazide 12.5 mg a day.  No other complaints or medical concerns today.  HPI   Prior to Admission medications   Medication Sig Start Date End Date Taking? Authorizing Provider  albuterol (PROVENTIL HFA;VENTOLIN HFA) 108 (90 Base) MCG/ACT inhaler Inhale 2 puffs into the lungs every 4 (four) hours as needed for wheezing. 07/29/17  Yes Stallings, Zoe A, MD  hydrochlorothiazide (MICROZIDE) 12.5 MG capsule TAKE 1 CAPSULE EVERY MORNING. 03/30/18  Yes Wendie Agreste, MD  omeprazole (PRILOSEC) 40 MG capsule TAKE 1 CAPSULE (40 MG TOTAL) BY MOUTH DAILY. 03/30/18  Yes Wendie Agreste, MD    Allergies  Allergen Reactions  . Shellfish Allergy     Patient Active Problem List   Diagnosis Date Noted  . Internal hemorrhoids with grade 2 prolapse 11/28/2014  . HTN (hypertension) 04/19/2013  . GERD (gastroesophageal reflux disease) 04/19/2013    Past Medical History:  Diagnosis Date  . Allergy   . Esophagitis   . Gastritis   . GERD (gastroesophageal reflux disease)   . Hypertension   . Internal hemorrhoids with grade 2 prolapse 11/28/2014    Past Surgical History:  Procedure Laterality Date  . COLONOSCOPY    . ESOPHAGOGASTRODUODENOSCOPY    . HEMORRHOID BANDING    . WISDOM TOOTH EXTRACTION  2009    Social History   Socioeconomic History  . Marital status: Single    Spouse name: Not on file  . Number of children: Not on file  . Years of education: Not on file  . Highest education level: Not on file  Occupational History  . Occupation: Forensic psychologist  Social Needs  . Financial resource strain: Not on file  . Food insecurity:    Worry: Not on file    Inability: Not on file  . Transportation needs:    Medical: Not on file    Non-medical: Not on file  Tobacco Use  . Smoking status: Never Smoker  . Smokeless tobacco: Never Used  Substance and Sexual Activity  . Alcohol use: No  . Drug use: No  . Sexual activity: Yes    Birth control/protection: Condom  Lifestyle  . Physical activity:    Days per week: Not on file    Minutes per session: Not on file  . Stress: Not on file  Relationships  . Social connections:    Talks on phone: Not on file    Gets together: Not on file    Attends religious service: Not on file    Active member of club or organization: Not on file    Attends meetings of clubs or organizations: Not on file    Relationship status: Not on file  . Intimate partner violence:    Fear of current or ex partner: Not on file    Emotionally abused: Not on file    Physically abused: Not on file    Forced sexual activity: Not on file  Other Topics Concern  . Not on file  Social History Narrative   Single. Education: [employer]. Exercise:  3 days a week for 2 hours.    Family History  Problem Relation Age of Onset  . Hypertension Mother   . Hypertension Other   . Colon cancer Neg Hx      Review of Systems  Constitutional: Negative.  Negative for chills and fever.  HENT: Negative.   Eyes: Negative.   Respiratory: Positive for cough. Negative for shortness of breath.   Cardiovascular: Negative.  Negative for chest pain and palpitations.  Gastrointestinal: Negative.  Negative for nausea and vomiting.  Genitourinary: Negative.   Skin: Negative.  Negative for rash.  Neurological: Negative.  Negative for dizziness and headaches.  Endo/Heme/Allergies: Negative.   All other systems reviewed and are negative.  Vitals:   08/20/18 0937  BP: 108/68  Pulse: 77  Resp: 17  Temp: 98.3 F (36.8 C)  SpO2:  98%     Physical Exam Vitals signs reviewed.  Constitutional:      Appearance: Normal appearance.  HENT:     Head: Normocephalic and atraumatic.     Nose: Nose normal.     Mouth/Throat:     Mouth: Mucous membranes are moist.     Pharynx: Oropharynx is clear.  Eyes:     Extraocular Movements: Extraocular movements intact.     Conjunctiva/sclera: Conjunctivae normal.     Pupils: Pupils are equal, round, and reactive to light.  Neck:     Musculoskeletal: Normal range of motion and neck supple.  Cardiovascular:     Rate and Rhythm: Normal rate and regular rhythm.     Heart sounds: Normal heart sounds.  Pulmonary:     Effort: Pulmonary effort is normal.     Breath sounds: Normal breath sounds.  Musculoskeletal: Normal range of motion.  Skin:    General: Skin is warm and dry.  Neurological:     General: No focal deficit present.     Mental Status: He is alert and oriented to person, place, and time.  Psychiatric:        Mood and Affect: Mood normal.        Behavior: Behavior normal.    A total of 25 minutes was spent in the room with the patient, greater than 50% of which was in counseling/coordination of care regarding differential diagnosis, treatment approach, medications, prognosis, and need for follow-up if no better or worse.   ASSESSMENT & PLAN: Justin Farley was seen today for cough.  Diagnoses and all orders for this visit:  Cough    Patient Instructions       If you have lab work done today you will be contacted with your lab results within the next 2 weeks.  If you have not heard from [place] then please contact us. The fastest way to get your results is to register for My Chart.   IF you received an x-ray today, you will receive an invoice from [facility]. Please contact [facility] at [phone] with questions or concerns regarding your invoice.   IF you received labwork today, you will receive an invoice from [place]. Please contact  [facility] at [phone] with questions or concerns regarding your invoice.   Our billing staff will not be able to assist you with questions regarding bills from these companies.  You will be contacted with the lab results as soon as they are available. The fastest way to get your results is to activate your My Chart account. Instructions are located on the last page of this paperwork. If you have not heard from [place]  regarding the results in 2 weeks, please contact this office.      Cough, Adult  A cough helps to clear your throat and lungs. A cough may last only 2-3 weeks (acute), or it may last longer than 8 weeks (chronic). Many different things can cause a cough. A cough may be a sign of an illness or another medical condition. Follow these instructions at home:  Pay attention to any changes in your cough.  Take medicines only as told by your doctor. ? If you were prescribed an antibiotic medicine, take it as told by your doctor. Do not stop taking it even if you start to feel better. ? Talk with your doctor before you try using a cough medicine.  Drink enough fluid to keep your pee (urine) clear or pale yellow.  If the air is dry, use a cold steam vaporizer or humidifier in your home.  Stay away from things that make you cough at work or at home.  If your cough is worse at night, try using extra pillows to raise your head up higher while you sleep.  Do not smoke, and try not to be around smoke. If you need help quitting, ask your doctor.  Do not have caffeine.  Do not drink alcohol.  Rest as needed. Contact a doctor if:  You have new problems (symptoms).  You cough up yellow fluid (pus).  Your cough does not get better after 2-3 weeks, or your cough gets worse.  Medicine does not help your cough and you are not sleeping well.  You have pain that gets worse or pain that is not helped with medicine.  You have a fever.  You are losing weight and you do not know why.   You have night sweats. Get help right away if:  You cough up blood.  You have trouble breathing.  Your heartbeat is very fast. This information is not intended to replace advice given to you by your health care provider. Make sure you discuss any questions you have with your health care provider. Document Released: 01/30/2011 Document Revised: 10/25/2015 Document Reviewed: 07/26/2014 Elsevier Interactive Patient Education  2019 Elsevier Inc.      Agustina Caroli, MD [facility]

## 2018-08-20 NOTE — Patient Instructions (Addendum)
     If you have lab work done today you will be contacted with your lab results within the next 2 weeks.  If you have not heard from us then please contact us. The fastest way to get your results is to register for My Chart.   IF you received an x-ray today, you will receive an invoice from [facility]. Please contact [facility] at [phone] with questions or concerns regarding your invoice.   IF you received labwork today, you will receive an invoice from [facility]. Please contact [facility] at [phone] with questions or concerns regarding your invoice.   Our billing staff will not be able to assist you with questions regarding bills from these companies.  You will be contacted with the lab results as soon as they are available. The fastest way to get your results is to activate your My Chart account. Instructions are located on the last page of this paperwork. If you have not heard from us regarding the results in 2 weeks, please contact this office.     Cough, Adult  A cough helps to clear your throat and lungs. A cough may last only 2-3 weeks (acute), or it may last longer than 8 weeks (chronic). Many different things can cause a cough. A cough may be a sign of an illness or another medical condition. Follow these instructions at home:  Pay attention to any changes in your cough.  Take medicines only as told by your doctor. ? If you were prescribed an antibiotic medicine, take it as told by your doctor. Do not stop taking it even if you start to feel better. ? Talk with your doctor before you try using a cough medicine.  Drink enough fluid to keep your pee (urine) clear or pale yellow.  If the air is dry, use a cold steam vaporizer or humidifier in your home.  Stay away from things that make you cough at work or at home.  If your cough is worse at night, try using extra pillows to raise your head up higher while you sleep.  Do not smoke, and try not to  be around smoke. If you need help quitting, ask your doctor.  Do not have caffeine.  Do not drink alcohol.  Rest as needed. Contact a doctor if:  You have new problems (symptoms).  You cough up yellow fluid (pus).  Your cough does not get better after 2-3 weeks, or your cough gets worse.  Medicine does not help your cough and you are not sleeping well.  You have pain that gets worse or pain that is not helped with medicine.  You have a fever.  You are losing weight and you do not know why.  You have night sweats. Get help right away if:  You cough up blood.  You have trouble breathing.  Your heartbeat is very fast. This information is not intended to replace advice given to you by your health care provider. Make sure you discuss any questions you have with your health care provider. Document Released: 01/30/2011 Document Revised: 10/25/2015 Document Reviewed: 07/26/2014 Elsevier Interactive Patient Education  2019 Elsevier Inc.  

## 2018-09-27 ENCOUNTER — Ambulatory Visit: Payer: 59 | Admitting: Family Medicine

## 2018-09-30 ENCOUNTER — Ambulatory Visit: Payer: 59 | Admitting: Family Medicine

## 2018-10-01 ENCOUNTER — Other Ambulatory Visit: Payer: Self-pay

## 2018-10-01 ENCOUNTER — Telehealth (INDEPENDENT_AMBULATORY_CARE_PROVIDER_SITE_OTHER): Payer: 59 | Admitting: Family Medicine

## 2018-10-01 DIAGNOSIS — K2100 Gastro-esophageal reflux disease with esophagitis, without bleeding: Secondary | ICD-10-CM

## 2018-10-01 DIAGNOSIS — Z13220 Encounter for screening for lipoid disorders: Secondary | ICD-10-CM

## 2018-10-01 DIAGNOSIS — I10 Essential (primary) hypertension: Secondary | ICD-10-CM

## 2018-10-01 MED ORDER — HYDROCHLOROTHIAZIDE 12.5 MG PO CAPS: ORAL_CAPSULE | ORAL | 3 refills | Status: DC

## 2018-10-01 MED ORDER — OMEPRAZOLE 40 MG PO CPDR: DELAYED_RELEASE_CAPSULE | ORAL | 1 refills | Status: DC

## 2018-10-01 NOTE — Patient Instructions (Signed)
° ° ° °  If you have lab work done today you will be contacted with your lab results within the next 2 weeks.  If you have not heard from us then please contact us. The fastest way to get your results is to register for My Chart. ° ° °IF you received an x-ray today, you will receive an invoice from [facility]. Please contact [facility] at [phone] with questions or concerns regarding your invoice.  ° °IF you received labwork today, you will receive an invoice from [facility]. Please contact [facility] at [phone] with questions or concerns regarding your invoice.  ° °Our billing staff will not be able to assist you with questions regarding bills from these companies. ° °You will be contacted with the lab results as soon as they are available. The fastest way to get your results is to activate your My Chart account. Instructions are located on the last page of this paperwork. If you have not heard from us regarding the results in 2 weeks, please contact this office. °  ° ° ° °

## 2018-10-01 NOTE — Progress Notes (Signed)
CC-6 mo f/u Blood pressure- Last seen by Dr Carlota Raspberry on 10/29/ No Changes to his medication at that time. Patient have not taken his blood pressure. Labs has been pended. Patient also need a refill on medication that has been pend.

## 2018-10-01 NOTE — Progress Notes (Signed)
Virtual Visit via Telephone Note  I connected with Justin Farley on 10/01/18 at 1:44 PM by telephone and verified that I am speaking with the correct person using two identifiers.   I discussed the limitations, risks, security and privacy concerns of performing an evaluation and management service by telephone and the availability of in person appointments. I also discussed with the patient that there may be a patient responsible charge related to this service. The patient expressed understanding and agreed to proceed, consent obtained  Chief complaint: HTN  History of Present Illness: Justin Farley is a 58 y.o. male  Hypertension: BP Readings from Last 3 Encounters:  08/20/18 108/68  03/30/18 130/87  09/30/17 112/86   Lab Results  Component Value Date   CREATININE 1.17 03/30/2018  on hctz 12.5mg  QD. Rare missed dose.  Home readings: no recent home readings.  Constitutional: Negative for fatigue and unexpected weight change.  Eyes: Negative for visual disturbance.  Respiratory: Negative for cough, chest tightness and shortness of breath.   Cardiovascular: Negative for chest pain, palpitations and leg swelling.  Gastrointestinal: Negative for abdominal pain and blood in stool.  Neurological: Negative for dizziness, light-headedness and headaches.  No hematuria. last PSA in 03/2018 stable form prior readings.     GERD: Omeprazole 3 times per week - controlling symptoms, no abd pain, bleeding, or weight loss.   Reactive airway - notes with exposure to powder in air at work - uses 1-2 days at the most - not needed in months.     Patient Active Problem List   Diagnosis Date Noted  . Internal hemorrhoids with grade 2 prolapse 11/28/2014  . HTN (hypertension) 04/19/2013  . GERD (gastroesophageal reflux disease) 04/19/2013   Past Medical History:  Diagnosis Date  . Allergy   . Esophagitis   . Gastritis   . GERD (gastroesophageal reflux disease)   . Hypertension    . Internal hemorrhoids with grade 2 prolapse 11/28/2014   Past Surgical History:  Procedure Laterality Date  . COLONOSCOPY    . ESOPHAGOGASTRODUODENOSCOPY    . HEMORRHOID BANDING    . WISDOM TOOTH EXTRACTION  2009   Allergies  Allergen Reactions  . Shellfish Allergy    Prior to Admission medications   Medication Sig Start Date End Date Taking? Authorizing Provider  albuterol (PROVENTIL HFA;VENTOLIN HFA) 108 (90 Base) MCG/ACT inhaler Inhale 2 puffs into the lungs every 4 (four) hours as needed for wheezing. 07/29/17  Yes Stallings, Zoe A, MD  hydrochlorothiazide (MICROZIDE) 12.5 MG capsule TAKE 1 CAPSULE EVERY MORNING. 03/30/18  Yes Wendie Agreste, MD  omeprazole (PRILOSEC) 40 MG capsule TAKE 1 CAPSULE (40 MG TOTAL) BY MOUTH DAILY. 03/30/18  Yes Wendie Agreste, MD   Social History   Socioeconomic History  . Marital status: Single    Spouse name: Not on file  . Number of children: Not on file  . Years of education: Not on file  . Highest education level: Not on file  Occupational History  . Occupation: Freight forwarder  Social Needs  . Financial resource strain: Not on file  . Food insecurity:    Worry: Not on file    Inability: Not on file  . Transportation needs:    Medical: Not on file    Non-medical: Not on file  Tobacco Use  . Smoking status: Never Smoker  . Smokeless tobacco: Never Used  Substance and Sexual Activity  . Alcohol use: No  . Drug use: No  .  Sexual activity: Yes    Birth control/protection: Condom  Lifestyle  . Physical activity:    Days per week: Not on file    Minutes per session: Not on file  . Stress: Not on file  Relationships  . Social connections:    Talks on phone: Not on file    Gets together: Not on file    Attends religious service: Not on file    Active member of club or organization: Not on file    Attends meetings of clubs or organizations: Not on file    Relationship status: Not on file  . Intimate partner violence:     Fear of current or ex partner: Not on file    Emotionally abused: Not on file    Physically abused: Not on file    Forced sexual activity: Not on file  Other Topics Concern  . Not on file  Social History Narrative   Single. Education: [employer]. Exercise: 3 days a week for 2 hours.     Observations/Objective: No distress, appropriate responses.   Assessment and Plan: Essential hypertension - Plan: Comprehensive metabolic panel, hydrochlorothiazide (MICROZIDE) 12.5 MG capsule  -  Stable, tolerating current regimen. Medications refilled. Labs pending as above.   Gastroesophageal reflux disease with esophagitis - Plan: omeprazole (PRILOSEC) 40 MG capsule  -  Stable, tolerating current regimen with intermittent dosing.  Medications refilled. Labs pending as above.   Screening for hyperlipidemia - Plan: Lipid Panel  Follow Up Instructions:  6 month appt, but lab visit next week.    I discussed the assessment and treatment plan with the patient. The patient was provided an opportunity to ask questions and all were answered. The patient agreed with the plan and demonstrated an understanding of the instructions.   The patient was advised to call back or seek an in-person evaluation if the symptoms worsen or if the condition fails to improve as anticipated.  I provided 7 minutes of non-face-to-face time during this encounter.  Signed,   [name], MD [facility].  10/01/18

## 2018-10-08 ENCOUNTER — Other Ambulatory Visit: Payer: Self-pay

## 2018-10-08 ENCOUNTER — Ambulatory Visit (INDEPENDENT_AMBULATORY_CARE_PROVIDER_SITE_OTHER): Payer: 59 | Admitting: Family Medicine

## 2018-10-08 DIAGNOSIS — I10 Essential (primary) hypertension: Secondary | ICD-10-CM

## 2018-10-08 DIAGNOSIS — Z13220 Encounter for screening for lipoid disorders: Secondary | ICD-10-CM

## 2018-10-09 LAB — COMPREHENSIVE METABOLIC PANEL
ALT: 29 IU/L (ref 0–44)
AST: 33 IU/L (ref 0–40)
Albumin/Globulin Ratio: 1.6 (ref 1.2–2.2)
Albumin: 4.7 g/dL (ref 3.8–4.9)
Alkaline Phosphatase: 29 IU/L — ABNORMAL LOW (ref 39–117)
BUN/Creatinine Ratio: 12 (ref 9–20)
BUN: 15 mg/dL (ref 6–24)
Bilirubin Total: 0.9 mg/dL (ref 0.0–1.2)
CO2: 16 mmol/L — ABNORMAL LOW (ref 20–29)
Calcium: 9.8 mg/dL (ref 8.7–10.2)
Chloride: 106 mmol/L (ref 96–106)
Creatinine, Ser: 1.25 mg/dL (ref 0.76–1.27)
GFR calc Af Amer: 74 mL/min/{1.73_m2} (ref >59)
GFR calc non Af Amer: 64 mL/min/{1.73_m2} (ref >59)
Globulin, Total: 3 g/dL (ref 1.5–4.5)
Glucose: 100 mg/dL — ABNORMAL HIGH (ref 65–99)
Potassium: 4.6 mmol/L (ref 3.5–5.2)
Sodium: 142 mmol/L (ref 134–144)
Total Protein: 7.7 g/dL (ref 6.0–8.5)

## 2018-10-09 LAB — LIPID PANEL
Chol/HDL Ratio: 2.6 ratio (ref 0.0–5.0)
Cholesterol, Total: 155 mg/dL (ref 100–199)
HDL: 59 mg/dL (ref >39)
LDL Calculated: 78 mg/dL (ref 0–99)
Triglycerides: 89 mg/dL (ref 0–149)
VLDL Cholesterol Cal: 18 mg/dL (ref 5–40)

## 2019-04-04 ENCOUNTER — Other Ambulatory Visit: Payer: Self-pay

## 2019-04-04 ENCOUNTER — Encounter: Payer: Self-pay | Admitting: Family Medicine

## 2019-04-04 ENCOUNTER — Ambulatory Visit (INDEPENDENT_AMBULATORY_CARE_PROVIDER_SITE_OTHER): Payer: 59 | Admitting: Family Medicine

## 2019-04-04 VITALS — BP 119/79 | HR 71 | Temp 98.3°F | Wt 164.8 lb

## 2019-04-04 DIAGNOSIS — Z1329 Encounter for screening for other suspected endocrine disorder: Secondary | ICD-10-CM

## 2019-04-04 DIAGNOSIS — I10 Essential (primary) hypertension: Secondary | ICD-10-CM

## 2019-04-04 DIAGNOSIS — Z131 Encounter for screening for diabetes mellitus: Secondary | ICD-10-CM | POA: Diagnosis not present

## 2019-04-04 DIAGNOSIS — Z0000 Encounter for general adult medical examination without abnormal findings: Secondary | ICD-10-CM

## 2019-04-04 DIAGNOSIS — Z130 Encounter for screening for diseases of the blood and blood-forming organs and certain disorders involving the immune mechanism: Secondary | ICD-10-CM

## 2019-04-04 DIAGNOSIS — Z13220 Encounter for screening for lipoid disorders: Secondary | ICD-10-CM

## 2019-04-04 DIAGNOSIS — Z23 Encounter for immunization: Secondary | ICD-10-CM

## 2019-04-04 DIAGNOSIS — J9801 Acute bronchospasm: Secondary | ICD-10-CM

## 2019-04-04 MED ORDER — ALBUTEROL SULFATE HFA 108 (90 BASE) MCG/ACT IN AERS: 2.0000 | INHALATION_SPRAY | RESPIRATORY_TRACT | 1 refills | Status: DC | PRN

## 2019-04-04 MED ORDER — SHINGRIX 50 MCG/0.5ML IM SUSR: 0.5000 mL | Freq: Once | INTRAMUSCULAR | 1 refills | Status: AC

## 2019-04-04 NOTE — Progress Notes (Signed)
Subjective:    Patient ID: Justin Farley, male    DOB: 1960-08-07, 58 y.o.   MRN: [id number]  HPI  Justin Farley is a 58 y.o. male Presents today for: Chief Complaint  Patient presents with  . Annual Exam    annual exam with no other issues.   Hypertension: BP Readings from Last 3 Encounters:  04/04/19 119/79  08/20/18 108/68  03/30/18 130/87   Lab Results  Component Value Date   CREATININE 1.25 10/08/2018  hctz 12.5mg  qd  GERD: Prilosec 40 mg daily.  History of gastritis. Taking about once per week - controlling symptoms.   Cancer screening Colonoscopy 05/10/2013 PSA 3.9 in October 2019, same in May 2019. Agrees to testing after r/b discussed.   Immunization History  Administered Date(s) Administered  . Influenza,inj,Quad PF,6+ Mos 03/14/2013, 03/27/2017, 03/30/2018  . Influenza-Unspecified 03/02/2014, 03/03/2015  . Tdap 03/27/2017  shingles - agrees to shingrix.   Flu vaccine few weeks ago.    Depression screen [facility] 2/9 04/04/2019 10/01/2018 08/20/2018 03/30/2018 09/30/2017  Decreased Interest 0 0 0 0 0  Down, Depressed, Hopeless 0 0 0 0 0  PHQ - 2 Score 0 0 0 0 0  Altered sleeping - - 0 - -  Tired, decreased energy - - 0 - -  Change in appetite - - 0 - -  Feeling bad or failure about yourself  - - 0 - -  Trouble concentrating - - 0 - -  Moving slowly or fidgety/restless - - 0 - -  Suicidal thoughts - - 0 - -  PHQ-9 Score - - 0 - -  Difficult doing work/chores - - Not difficult at all - -    Hearing Screening   125Hz  250Hz  500Hz  1000Hz  2000Hz  3000Hz  4000Hz  6000Hz  8000Hz   Right ear:           Left ear:             Visual Acuity Screening   Right eye Left eye Both eyes  Without correction: 20/25    With correction:  20/70 20/25  appt with optho soon - wears contacts.   Dental: every 6 months - appt few weeks ago.   Exercise:   3 days per week, 1 hr. Body mass index is 28.29 kg/m.  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 Less need. Less than once per week.   Patient Active Problem List   Diagnosis Date Noted  . Internal hemorrhoids with grade 2 prolapse 11/28/2014  . HTN (hypertension) 04/19/2013  . GERD (gastroesophageal reflux disease) 04/19/2013   Past Medical History:  Diagnosis Date  . Allergy   . Esophagitis   . Gastritis   . GERD (gastroesophageal reflux disease)   . Hypertension   . Internal hemorrhoids with grade 2 prolapse 11/28/2014   Past Surgical History:  Procedure Laterality Date  . COLONOSCOPY    . ESOPHAGOGASTRODUODENOSCOPY    . HEMORRHOID BANDING    . WISDOM TOOTH EXTRACTION  2009   Allergies  Allergen Reactions  . Shellfish Allergy    Prior to Admission medications   Medication Sig Start Date End Date Taking? Authorizing Provider  albuterol (PROVENTIL HFA;VENTOLIN HFA) 108 (90 Base) MCG/ACT inhaler Inhale 2 puffs into the lungs every 4 (four) hours as needed for wheezing. 07/29/17  Yes Stallings, Zoe A, MD  hydrochlorothiazide (MICROZIDE) 12.5 MG capsule TAKE 1 CAPSULE EVERY MORNING. 10/01/18  Yes Wendie Agreste, MD  omeprazole ([place])  40 MG capsule TAKE 1 CAPSULE (40 MG TOTAL) BY MOUTH DAILY. 10/01/18  Yes Wendie Agreste, MD   Social History   Socioeconomic History  . Marital status: Single    Spouse name: Not on file  . Number of children: Not on file  . Years of education: Not on file  . Highest education level: Not on file  Occupational History  . Occupation: Freight forwarder  Social Needs  . Financial resource strain: Not on file  . Food insecurity    Worry: Not on file    Inability: Not on file  . Transportation needs    Medical: Not on file    Non-medical: Not on file  Tobacco Use  . Smoking status: Never Smoker  . Smokeless tobacco: Never Used  Substance and Sexual Activity  . Alcohol use: No  . Drug use: No  . Sexual activity: Yes    Birth control/protection: Condom  Lifestyle  . Physical activity    Days  per week: Not on file    Minutes per session: Not on file  . Stress: Not on file  Relationships  . Social Herbalist on phone: Not on file    Gets together: Not on file    Attends religious service: Not on file    Active member of club or organization: Not on file    Attends meetings of clubs or organizations: Not on file    Relationship status: Not on file  . Intimate partner violence    Fear of current or ex partner: Not on file    Emotionally abused: Not on file    Physically abused: Not on file    Forced sexual activity: Not on file  Other Topics Concern  . Not on file  Social History Narrative   Single. Education: [employer]. Exercise: 3 days a week for 2 hours.     Review of Systems 13 point review of systems per patient health survey noted.  Negative other than as indicated above or in HPI.      Objective:   Physical Exam Vitals signs reviewed.  Constitutional:      Appearance: He is well-developed.  HENT:     Head: Normocephalic and atraumatic.     Right Ear: External ear normal.     Left Ear: External ear normal.  Eyes:     Conjunctiva/sclera: Conjunctivae normal.     Pupils: Pupils are equal, round, and reactive to light.  Neck:     Musculoskeletal: Normal range of motion and neck supple.     Thyroid: No thyromegaly.  Cardiovascular:     Rate and Rhythm: Normal rate and regular rhythm.     Heart sounds: Normal heart sounds.  Pulmonary:     Effort: Pulmonary effort is normal. No respiratory distress.     Breath sounds: Normal breath sounds. No wheezing.  Abdominal:     General: There is no distension.     Palpations: Abdomen is soft.     Tenderness: There is no abdominal tenderness.     Hernia: There is no hernia in the left inguinal area or right inguinal area.  Genitourinary:    Prostate: Normal.     Rectum: External hemorrhoid present.  Musculoskeletal: Normal range of motion.        General: No tenderness.  Lymphadenopathy:      Cervical: No cervical adenopathy.  Skin:    General: Skin is warm and dry.  Neurological:  Mental Status: He is alert and oriented to person, place, and time.     Deep Tendon Reflexes: Reflexes are normal and symmetric.  Psychiatric:        Behavior: Behavior normal.    Vitals:   04/04/19 0802  BP: 119/79  Pulse: 71  Temp: 98.3 F (36.8 C)  TempSrc: Oral  SpO2: 98%  Weight: 164 lb 12.8 oz (74.8 kg)       Assessment & Plan:   Justin Farley is a 58 y.o. male Annual physical exam  - -anticipatory guidance as below in AVS, screening labs above. Health maintenance items as above in HPI discussed/recommended as applicable.   Need for shingles vaccine - Plan: Zoster Vaccine Adjuvanted [facility]) injection  Bronchospasm - Plan: albuterol (VENTOLIN HFA) 108 (90 Base) MCG/ACT inhaler  - rare need. Refilled albuterol for prn use with rtc precautions.   Hypertension:  - stable, continue HCTZ.   Screening for hyperlipidemia - Plan: Comprehensive metabolic panel, Lipid panel  Screening for diabetes mellitus - Plan: Comprehensive metabolic panel  Screening for thyroid disorder - Plan: TSH  Screening, anemia, deficiency, iron - Plan: CBC   Meds ordered this encounter  Medications  . Zoster Vaccine Adjuvanted [facility]) injection    Sig: Inject 0.5 mLs into the muscle once for 1 dose. Repeat in 2-6 months.    Dispense:  0.5 mL    Refill:  1  . albuterol (VENTOLIN HFA) 108 (90 Base) MCG/ACT inhaler    Sig: Inhale 2 puffs into the lungs every 4 (four) hours as needed for wheezing.    Dispense:  18 g    Refill:  1    Please fill with generic albuterol   Patient Instructions   No change in medications for now.  If you find yourself needing albuterol more often, let me know.  Thank you for coming in today.  Keeping you healthy  Get these tests  Blood pressure- Have your blood pressure checked once a year by your healthcare provider.  Normal blood pressure is 120/80   Weight- Have your body mass index (BMI) calculated to screen for obesity.  BMI is a measure of body fat based on height and weight. You can also calculate your own BMI at [email or web address].  Cholesterol- Have your cholesterol checked every year.  Diabetes- Have your blood sugar checked regularly if you have high blood pressure, high cholesterol, have a family history of diabetes or if you are overweight.  Screening for Colon Cancer- Colonoscopy starting at age 56.  Screening may begin sooner depending on your family history and other health conditions. Follow up colonoscopy as directed by your Gastroenterologist.  Screening for Prostate Cancer- Both blood work (PSA) and a rectal exam help screen for Prostate Cancer.  Screening begins at age 6 with African-American men and at age 82 with Caucasian men.  Screening may begin sooner depending on your family history.  Take these medicines  Aspirin- One aspirin daily can help prevent Heart disease and Stroke.  Flu shot- Every fall.  Tetanus- Every 10 years.  Zostavax- Once after the age of 36 to prevent Shingles.  Pneumonia shot- Once after the age of 44; if you are younger than 28, ask your healthcare provider if you need a Pneumonia shot.  Take these steps  Don't smoke- If you do smoke, talk to your doctor about quitting.  For tips on how to quit, go to www.smokefree.gov or call 1-800-QUIT-NOW.  Be physically active- Exercise 5 days a week for  at least 30 minutes.  If you are not already physically active start slow and gradually work up to 30 minutes of moderate physical activity.  Examples of moderate activity include walking briskly, mowing the yard, dancing, swimming, bicycling, etc.  Eat a healthy diet- Eat a variety of healthy food such as fruits, vegetables, low fat milk, low fat cheese, yogurt, lean meant, poultry, fish, beans, tofu, etc. For more information go to www.thenutritionsource.org  Drink alcohol in moderation-  Limit alcohol intake to less than two drinks a day. Never drink and drive.  Dentist- Brush and floss twice daily; visit your dentist twice a year.  Depression- Your emotional health is as important as your physical health. If you're feeling down, or losing interest in things you would normally enjoy please talk to your healthcare provider.  Eye exam- Visit your eye doctor every year.  Safe sex- If you may be exposed to a sexually transmitted infection, use a condom.  Seat belts- Seat belts can save your life; always wear one.  Smoke/Carbon Monoxide detectors- These detectors need to be installed on the appropriate level of your home.  Replace batteries at least once a year.  Skin cancer- When out in the sun, cover up and use sunscreen 15 SPF or higher.  Violence- If anyone is threatening you, please tell your healthcare provider.  Living Will/ Health care power of attorney- Speak with your healthcare provider and family.   If you have lab work done today you will be contacted with your lab results within the next 2 weeks.  If you have not heard from [place] then please contact us. The fastest way to get your results is to register for My Chart.   IF you received an x-ray today, you will receive an invoice from [facility]. Please contact [facility] at [phone] with questions or concerns regarding your invoice.   IF you received labwork today, you will receive an invoice from [facility]. Please contact [facility] at [phone] with questions or concerns regarding your invoice.   Our billing staff will not be able to assist you with questions regarding bills from these companies.  You will be contacted with the lab results as soon as they are available. The fastest way to get your results is to activate your My Chart account. Instructions are located on the last page of this paperwork. If you have not heard from [place] regarding the results in 2 weeks, please contact this  office.       Signed,   [name], MD [facility] [place].  04/04/19 8:35 AM

## 2019-04-04 NOTE — Patient Instructions (Addendum)
No change in medications for now.  If you find yourself needing albuterol more often, let me know.  Thank you for coming in today.  Keeping you healthy  Get these tests  Blood pressure- Have your blood pressure checked once a year by your healthcare provider.  Normal blood pressure is 120/80  Weight- Have your body mass index (BMI) calculated to screen for obesity.  BMI is a measure of body fat based on height and weight. You can also calculate your own BMI at [email or web address].  Cholesterol- Have your cholesterol checked every year.  Diabetes- Have your blood sugar checked regularly if you have high blood pressure, high cholesterol, have a family history of diabetes or if you are overweight.  Screening for Colon Cancer- Colonoscopy starting at age 65.  Screening may begin sooner depending on your family history and other health conditions. Follow up colonoscopy as directed by your Gastroenterologist.  Screening for Prostate Cancer- Both blood work (PSA) and a rectal exam help screen for Prostate Cancer.  Screening begins at age 41 with African-American men and at age 21 with Caucasian men.  Screening may begin sooner depending on your family history.  Take these medicines  Aspirin- One aspirin daily can help prevent Heart disease and Stroke.  Flu shot- Every fall.  Tetanus- Every 10 years.  Zostavax- Once after the age of 48 to prevent Shingles.  Pneumonia shot- Once after the age of 35; if you are younger than 40, ask your healthcare provider if you need a Pneumonia shot.  Take these steps  Don't smoke- If you do smoke, talk to your doctor about quitting.  For tips on how to quit, go to www.smokefree.gov or call 1-800-QUIT-NOW.  Be physically active- Exercise 5 days a week for at least 30 minutes.  If you are not already physically active start slow and gradually work up to 30 minutes of moderate physical activity.  Examples of moderate activity include walking briskly,  mowing the yard, dancing, swimming, bicycling, etc.  Eat a healthy diet- Eat a variety of healthy food such as fruits, vegetables, low fat milk, low fat cheese, yogurt, lean meant, poultry, fish, beans, tofu, etc. For more information go to www.thenutritionsource.org  Drink alcohol in moderation- Limit alcohol intake to less than two drinks a day. Never drink and drive.  Dentist- Brush and floss twice daily; visit your dentist twice a year.  Depression- Your emotional health is as important as your physical health. If you're feeling down, or losing interest in things you would normally enjoy please talk to your healthcare provider.  Eye exam- Visit your eye doctor every year.  Safe sex- If you may be exposed to a sexually transmitted infection, use a condom.  Seat belts- Seat belts can save your life; always wear one.  Smoke/Carbon Monoxide detectors- These detectors need to be installed on the appropriate level of your home.  Replace batteries at least once a year.  Skin cancer- When out in the sun, cover up and use sunscreen 15 SPF or higher.  Violence- If anyone is threatening you, please tell your healthcare provider.  Living Will/ Health care power of attorney- Speak with your healthcare provider and family.   If you have lab work done today you will be contacted with your lab results within the next 2 weeks.  If you have not heard from [place] then please contact us. The fastest way to get your results is to register for My Chart.   IF you received an  x-ray today, you will receive an invoice from [facility]. Please contact [facility] at [phone] with questions or concerns regarding your invoice.   IF you received labwork today, you will receive an invoice from [place]. Please contact [facility] at [phone] with questions or concerns regarding your invoice.   Our billing staff will not be able to assist you with questions regarding bills from these  companies.  You will be contacted with the lab results as soon as they are available. The fastest way to get your results is to activate your My Chart account. Instructions are located on the last page of this paperwork. If you have not heard from [place] regarding the results in 2 weeks, please contact this office.

## 2019-04-05 LAB — LIPID PANEL
Chol/HDL Ratio: 2.4 ratio (ref 0.0–5.0)
Cholesterol, Total: 131 mg/dL (ref 100–199)
HDL: 55 mg/dL (ref >39)
LDL Chol Calc (NIH): 60 mg/dL (ref 0–99)
Triglycerides: 85 mg/dL (ref 0–149)
VLDL Cholesterol Cal: 16 mg/dL (ref 5–40)

## 2019-04-05 LAB — COMPREHENSIVE METABOLIC PANEL
ALT: 16 IU/L (ref 0–44)
AST: 22 IU/L (ref 0–40)
Albumin/Globulin Ratio: 1.7 (ref 1.2–2.2)
Albumin: 4.7 g/dL (ref 3.8–4.9)
Alkaline Phosphatase: 31 IU/L — ABNORMAL LOW (ref 39–117)
BUN/Creatinine Ratio: 13 (ref 9–20)
BUN: 13 mg/dL (ref 6–24)
Bilirubin Total: 0.8 mg/dL (ref 0.0–1.2)
CO2: 21 mmol/L (ref 20–29)
Calcium: 9.5 mg/dL (ref 8.7–10.2)
Chloride: 104 mmol/L (ref 96–106)
Creatinine, Ser: 1.01 mg/dL (ref 0.76–1.27)
GFR calc Af Amer: 96 mL/min/{1.73_m2} (ref >59)
GFR calc non Af Amer: 83 mL/min/{1.73_m2} (ref >59)
Globulin, Total: 2.7 g/dL (ref 1.5–4.5)
Glucose: 103 mg/dL — ABNORMAL HIGH (ref 65–99)
Potassium: 4.9 mmol/L (ref 3.5–5.2)
Sodium: 142 mmol/L (ref 134–144)
Total Protein: 7.4 g/dL (ref 6.0–8.5)

## 2019-04-05 LAB — CBC
Hematocrit: 45.7 % (ref 37.5–51.0)
Hemoglobin: 15.1 g/dL (ref 13.0–17.7)
MCH: 29 pg (ref 26.6–33.0)
MCHC: 33 g/dL (ref 31.5–35.7)
MCV: 88 fL (ref 79–97)
Platelets: 185 10*3/uL (ref 150–450)
RBC: 5.21 x10E6/uL (ref 4.14–5.80)
RDW: 13.1 % (ref 11.6–15.4)
WBC: 3.2 10*3/uL — ABNORMAL LOW (ref 3.4–10.8)

## 2019-04-05 LAB — TSH: TSH: 1.49 u[IU]/mL (ref 0.450–4.500)

## 2019-04-08 ENCOUNTER — Ambulatory Visit: Payer: 59 | Admitting: Family Medicine

## 2019-04-08 ENCOUNTER — Other Ambulatory Visit: Payer: Self-pay

## 2019-04-08 ENCOUNTER — Encounter: Payer: Self-pay | Admitting: Family Medicine

## 2019-04-08 VITALS — BP 127/78 | HR 72 | Temp 97.7°F | Wt 165.4 lb

## 2019-04-08 DIAGNOSIS — Z125 Encounter for screening for malignant neoplasm of prostate: Secondary | ICD-10-CM

## 2019-04-08 DIAGNOSIS — R739 Hyperglycemia, unspecified: Secondary | ICD-10-CM

## 2019-04-08 NOTE — Patient Instructions (Signed)
° ° ° °  If you have lab work done today you will be contacted with your lab results within the next 2 weeks.  If you have not heard from us then please contact us. The fastest way to get your results is to register for My Chart. ° ° °IF you received an x-ray today, you will receive an invoice from [facility]. Please contact [facility] at [phone] with questions or concerns regarding your invoice.  ° °IF you received labwork today, you will receive an invoice from [facility]. Please contact [facility] at [phone] with questions or concerns regarding your invoice.  ° °Our billing staff will not be able to assist you with questions regarding bills from these companies. ° °You will be contacted with the lab results as soon as they are available. The fastest way to get your results is to activate your My Chart account. Instructions are located on the last page of this paperwork. If you have not heard from us regarding the results in 2 weeks, please contact this office. °  ° ° ° °

## 2019-04-08 NOTE — Progress Notes (Signed)
Lab only visit as had physical earlier this week.  Mild hyperglycemia, check A1c.  PSA was not obtained at last visit, will order today.  Plan on follow-up in 6 months.

## 2019-04-09 LAB — HEMOGLOBIN A1C
Est. average glucose Bld gHb Est-mCnc: 117 mg/dL
Hgb A1c MFr Bld: 5.7 % — ABNORMAL HIGH (ref 4.8–5.6)

## 2019-04-09 LAB — PSA: Prostate Specific Ag, Serum: 3.5 ng/mL (ref 0.0–4.0)

## 2019-04-18 ENCOUNTER — Encounter: Payer: Self-pay | Admitting: Radiology

## 2019-09-27 ENCOUNTER — Other Ambulatory Visit: Payer: Self-pay

## 2019-09-27 ENCOUNTER — Encounter: Payer: Self-pay | Admitting: Sports Medicine

## 2019-09-27 ENCOUNTER — Ambulatory Visit: Payer: 59 | Admitting: Sports Medicine

## 2019-09-27 VITALS — BP 120/69 | HR 81 | Temp 97.7°F | Resp 16

## 2019-09-27 DIAGNOSIS — B351 Tinea unguium: Secondary | ICD-10-CM

## 2019-09-27 DIAGNOSIS — M79674 Pain in right toe(s): Secondary | ICD-10-CM

## 2019-09-27 NOTE — Progress Notes (Signed)
Subjective: Justin Farley is a 59 y.o. male patient seen today in office with complaint of mildly painful thickened and discolored nails on right. Patient is desiring treatment for nail changes; has tried OTC topicals/Medication in the past with no improvement. Reports that nails are becoming difficult to manage because of the thickness and constantly wears steel toed boots. Patient has no other pedal complaints at this time.   Review of Systems  All other systems reviewed and are negative.    Patient Active Problem List   Diagnosis Date Noted  . Internal hemorrhoids with grade 2 prolapse 11/28/2014  . HTN (hypertension) 04/19/2013  . GERD (gastroesophageal reflux disease) 04/19/2013    Current Outpatient Medications on File Prior to Visit  Medication Sig Dispense Refill  . albuterol (VENTOLIN HFA) 108 (90 Base) MCG/ACT inhaler Inhale 2 puffs into the lungs every 4 (four) hours as needed for wheezing. 18 g 1  . hydrochlorothiazide (MICROZIDE) 12.5 MG capsule TAKE 1 CAPSULE EVERY MORNING. 90 capsule 3  . omeprazole (PRILOSEC) 40 MG capsule TAKE 1 CAPSULE (40 MG TOTAL) BY MOUTH DAILY. 90 capsule 1   No current facility-administered medications on file prior to visit.    Allergies  Allergen Reactions  . Shellfish Allergy     Objective: Physical Exam  General: Well developed, nourished, no acute distress, awake, alert and oriented x 3  Vascular: Dorsalis pedis artery 2/4 bilateral, Posterior tibial artery 2/4 bilateral, skin temperature warm to warm proximal to distal bilateral lower extremities, no varicosities, pedal hair present bilateral.  Neurological: Gross sensation present via light touch bilateral.   Dermatological: Skin is warm, dry, and supple bilateral, Nails at right 1st, 4-5 toes are tender, short thick, and discolored with mild subungal debris, no webspace macerations present bilateral, no open lesions present bilateral, no callus/corns/hyperkeratotic tissue  present bilateral. No signs of infection bilateral.  Musculoskeletal: No boney deformities noted bilateral. Muscular strength within normal limits without painon range of motion. No pain with calf compression bilateral.  Assessment and Plan:  Problem List Items Addressed This Visit    None    Visit Diagnoses    Dermatophytosis of nail    -  Primary   Relevant Orders   Fungus Culture with Smear   Toe pain, right          -Examined patient -Discussed treatment options for painful dystrophic nails  -Fungal culture was obtained by removing a portion of the hard nail itself from each of the involved toenails on right using a sterile nail nipper and sent to [facility] lab. Patient tolerated the biopsy procedure well without discomfort or need for anesthesia.  -Patient to return in 4 weeks for follow up evaluation and discussion of fungal culture results or sooner if symptoms worsen.  Landis Martins, DPM

## 2019-10-03 ENCOUNTER — Other Ambulatory Visit: Payer: Self-pay

## 2019-10-03 ENCOUNTER — Encounter: Payer: Self-pay | Admitting: Family Medicine

## 2019-10-03 ENCOUNTER — Ambulatory Visit (INDEPENDENT_AMBULATORY_CARE_PROVIDER_SITE_OTHER): Payer: 59

## 2019-10-03 ENCOUNTER — Ambulatory Visit: Payer: 59 | Admitting: Family Medicine

## 2019-10-03 VITALS — BP 115/76 | HR 69 | Temp 98.6°F | Ht 64.0 in | Wt 165.0 lb

## 2019-10-03 DIAGNOSIS — R7303 Prediabetes: Secondary | ICD-10-CM

## 2019-10-03 DIAGNOSIS — J9801 Acute bronchospasm: Secondary | ICD-10-CM

## 2019-10-03 DIAGNOSIS — I10 Essential (primary) hypertension: Secondary | ICD-10-CM | POA: Diagnosis not present

## 2019-10-03 DIAGNOSIS — K2100 Gastro-esophageal reflux disease with esophagitis, without bleeding: Secondary | ICD-10-CM | POA: Diagnosis not present

## 2019-10-03 DIAGNOSIS — M25531 Pain in right wrist: Secondary | ICD-10-CM | POA: Diagnosis not present

## 2019-10-03 MED ORDER — ALBUTEROL SULFATE HFA 108 (90 BASE) MCG/ACT IN AERS: 2.0000 | INHALATION_SPRAY | RESPIRATORY_TRACT | 1 refills | Status: AC | PRN

## 2019-10-03 MED ORDER — HYDROCHLOROTHIAZIDE 12.5 MG PO CAPS: ORAL_CAPSULE | ORAL | 3 refills | Status: DC

## 2019-10-03 MED ORDER — OMEPRAZOLE 40 MG PO CPDR: DELAYED_RELEASE_CAPSULE | ORAL | 1 refills | Status: DC

## 2019-10-03 NOTE — Progress Notes (Signed)
Subjective:  Patient ID: Justin Farley, male    DOB: Jul 14, 1960  Age: 59 y.o. MRN: [id number]  CC:  Chief Complaint  Patient presents with  . Follow-up    on hypertenstion,  asthma, and acid reflux. pt states he hasn't had any issues with these conditions since last visit also no physical symptoms.  . Medication Refill    on Albuterol, hydrochlorothiazide, and omeprazole. medication works well for pt with no side effects.  . Wrist Pain    started 3 weeks to 1 month ago. pt isn't sure if anything triggered it other than he lifts whights and may have lifted incorrectly.    HPI Justin Farley presents for   Hypertension: hctz 12/5mg  QD.  Home readings:120/70's.  BP Readings from Last 3 Encounters:  10/03/19 115/76  09/27/19 120/69  04/08/19 127/78   Lab Results  Component Value Date   CREATININE 1.01 04/04/2019   Bronchospasm/mild intermittent asthma  -Treated with rare albuterol.  No recent use - last used a year ago.   GERD with history of gastritis Prilosec 40 mg BD as needed, has taken meds once per week that have controlled  Symptoms.still controlled with about once per week.   Hyperglycemia/prediabetes Borderline A1c prior. Exercise 3-4 days per week. More than last visit.  Wt Readings from Last 3 Encounters:  10/03/19 165 lb (74.8 kg)  04/08/19 165 lb 6.4 oz (75 kg)  04/04/19 164 lb 12.8 oz (74.8 kg)   Lab Results  Component Value Date   HGBA1C 5.7 (H) 04/08/2019   Right Wrist pain R hand dominant.  Pain past 6 weeks. NKI. Just started. Notices with exercise workouts, sometimes at rest.  Supervisor - desk work, Teaching laboratory technician work usually.  No prior injury/pain.  Tx. Tylenol. Some relief.    History Patient Active Problem List   Diagnosis Date Noted  . Internal hemorrhoids with grade 2 prolapse 11/28/2014  . HTN (hypertension) 04/19/2013  . GERD (gastroesophageal reflux disease) 04/19/2013   Past Medical History:  Diagnosis Date  . Allergy   .  Esophagitis   . Gastritis   . GERD (gastroesophageal reflux disease)   . Hypertension   . Internal hemorrhoids with grade 2 prolapse 11/28/2014   Past Surgical History:  Procedure Laterality Date  . COLONOSCOPY    . ESOPHAGOGASTRODUODENOSCOPY    . HEMORRHOID BANDING    . WISDOM TOOTH EXTRACTION  2009   Allergies  Allergen Reactions  . Shellfish Allergy    Prior to Admission medications   Medication Sig Start Date End Date Taking? Authorizing Provider  albuterol (VENTOLIN HFA) 108 (90 Base) MCG/ACT inhaler Inhale 2 puffs into the lungs every 4 (four) hours as needed for wheezing. 04/04/19  Yes Wendie Agreste, MD  hydrochlorothiazide (MICROZIDE) 12.5 MG capsule TAKE 1 CAPSULE EVERY MORNING. 10/01/18  Yes Wendie Agreste, MD  omeprazole (PRILOSEC) 40 MG capsule TAKE 1 CAPSULE (40 MG TOTAL) BY MOUTH DAILY. 10/01/18  Yes Wendie Agreste, MD   Social History   Socioeconomic History  . Marital status: Single    Spouse name: Not on file  . Number of children: Not on file  . Years of education: Not on file  . Highest education level: Not on file  Occupational History  . Occupation: Freight forwarder  Tobacco Use  . Smoking status: Never Smoker  . Smokeless tobacco: Never Used  Substance and Sexual Activity  . Alcohol use: No  . Drug use: No  . Sexual activity: Yes  Birth control/protection: Condom  Other Topics Concern  . Not on file  Social History Narrative   Single. Education: [employer]. Exercise: 3 days a week for 2 hours.   Social Determinants of Health   Financial Resource Strain:   . Difficulty of Paying Living Expenses:   Food Insecurity:   . Worried About Charity fundraiser in the Last Year:   . Arboriculturist in the Last Year:   Transportation Needs:   . Film/video editor (Medical):   Marland Kitchen Lack of Transportation (Non-Medical):   Physical Activity:   . Days of Exercise per Week:   . Minutes of Exercise per Session:   Stress:   . Feeling of Stress :    Social Connections:   . Frequency of Communication with Friends and Family:   . Frequency of Social Gatherings with Friends and Family:   . Attends Religious Services:   . Active Member of Clubs or Organizations:   . Attends Archivist Meetings:   Marland Kitchen Marital Status:   Intimate Partner Violence:   . Fear of Current or Ex-Partner:   . Emotionally Abused:   Marland Kitchen Physically Abused:   . Sexually Abused:     Review of Systems  Constitutional: Negative for fatigue and unexpected weight change.  Eyes: Negative for visual disturbance.  Respiratory: Negative for cough, chest tightness and shortness of breath.   Cardiovascular: Negative for chest pain, palpitations and leg swelling.  Gastrointestinal: Negative for abdominal pain and blood in stool.  Neurological: Negative for dizziness, light-headedness and headaches.     Objective:   Vitals:   10/03/19 0825  BP: 115/76  Pulse: 69  Temp: 98.6 F (37 C)  TempSrc: Temporal  SpO2: 99%  Weight: 165 lb (74.8 kg)  Height: 5\' 4"  (1.626 m)     Physical Exam Vitals reviewed.  Constitutional:      Appearance: He is well-developed.  HENT:     Head: Normocephalic and atraumatic.  Eyes:     Pupils: Pupils are equal, round, and reactive to light.  Neck:     Vascular: No carotid bruit or JVD.  Cardiovascular:     Rate and Rhythm: Normal rate and regular rhythm.     Heart sounds: Normal heart sounds. No murmur.  Pulmonary:     Effort: Pulmonary effort is normal.     Breath sounds: Normal breath sounds. No wheezing or rales.  Abdominal:     Tenderness: There is no abdominal tenderness.  Musculoskeletal:     Comments: Right wrist full range of motion, full strength.  No swelling, skin intact.  Negative Tinel, negative Finkelstein.  Slight tenderness palpation at the volar distal radius  Skin:    General: Skin is warm and dry.  Neurological:     Mental Status: He is alert and oriented to person, place, and time.     DG  Wrist Complete Right  Result Date: 10/03/2019 CLINICAL DATA:  Wrist pain after fall. EXAM: RIGHT WRIST - COMPLETE 3+ VIEW COMPARISON:  None. FINDINGS: There is no evidence of fracture or dislocation. There is no evidence of arthropathy or other focal bone abnormality. Soft tissues are unremarkable. IMPRESSION: Negative. Electronically Signed   By: Kerby Moors M.D.   On: 10/03/2019 09:06      Assessment & Plan:  Justin Farley is a 59 y.o. male . Essential hypertension - Plan: Comprehensive metabolic panel, Lipid panel, hydrochlorothiazide (MICROZIDE) 12.5 MG capsule   -  Stable, tolerating current  regimen. Medications refilled. Labs pending as above.   Prediabetes - Plan: Hemoglobin A1c Continue diet/exercise approach, check A1c.  Bronchospasm - Plan: albuterol (VENTOLIN HFA) 108 (90 Base) MCG/ACT inhaler  -Controlled.  Albuterol if needed  Gastroesophageal reflux disease with esophagitis - Plan: omeprazole (PRILOSEC) 40 MG capsule  -Stable with intermittent PPI dosing, continue same  Right wrist pain  -Slight tenderness at the distal radius but no known injury, good range of motion, good strength, reassuring x-ray.  Trial of wrist rest at work with typing, wrist brace as needed with range of motion throughout the day, recheck next few weeks if not improving, sooner if worse.  No orders of the defined types were placed in this encounter.  Patient Instructions       If you have lab work done today you will be contacted with your lab results within the next 2 weeks.  If you have not heard from [place] then please contact us. The fastest way to get your results is to register for My Chart.   IF you received an x-ray today, you will receive an invoice from [facility]. Please contact [facility] at [phone] with questions or concerns regarding your invoice.   IF you received labwork today, you will receive an invoice from [place]. Please contact [facility] at  [phone] with questions or concerns regarding your invoice.   Our billing staff will not be able to assist you with questions regarding bills from these companies.  You will be contacted with the lab results as soon as they are available. The fastest way to get your results is to activate your My Chart account. Instructions are located on the last page of this paperwork. If you have not heard from [place] regarding the results in 2 weeks, please contact this office.         Signed, [name], MD [facility]

## 2019-10-03 NOTE — Patient Instructions (Addendum)
  No med changes at this time.  Try wrist rest when using a mouse at work to see if that helps with some of your wrist pain.  Tylenol over-the-counter as needed is fine for now. Wrist brace as needed, but out of brace at least 3 times per day for range of motion. Avoid heavy lifting with that wrist for now. recheck in next 3-4 weeks if not improved. Sooner if worse.   If you have lab work done today you will be contacted with your lab results within the next 2 weeks.  If you have not heard from [place] then please contact us. The fastest way to get your results is to register for My Chart.   IF you received an x-ray today, you will receive an invoice from [facility]. Please contact [facility] at [phone] with questions or concerns regarding your invoice.   IF you received labwork today, you will receive an invoice from [place]. Please contact [facility] at [phone] with questions or concerns regarding your invoice.   Our billing staff will not be able to assist you with questions regarding bills from these companies.  You will be contacted with the lab results as soon as they are available. The fastest way to get your results is to activate your My Chart account. Instructions are located on the last page of this paperwork. If you have not heard from [place] regarding the results in 2 weeks, please contact this office.

## 2019-10-04 ENCOUNTER — Ambulatory Visit: Payer: 59 | Admitting: Family Medicine

## 2019-10-04 LAB — COMPREHENSIVE METABOLIC PANEL
ALT: 31 IU/L (ref 0–44)
AST: 38 IU/L (ref 0–40)
Albumin/Globulin Ratio: 1.7 (ref 1.2–2.2)
Albumin: 4.7 g/dL (ref 3.8–4.9)
Alkaline Phosphatase: 33 IU/L — ABNORMAL LOW (ref 39–117)
BUN/Creatinine Ratio: 14 (ref 9–20)
BUN: 17 mg/dL (ref 6–24)
Bilirubin Total: 0.7 mg/dL (ref 0.0–1.2)
CO2: 20 mmol/L (ref 20–29)
Calcium: 9.4 mg/dL (ref 8.7–10.2)
Chloride: 104 mmol/L (ref 96–106)
Creatinine, Ser: 1.19 mg/dL (ref 0.76–1.27)
GFR calc Af Amer: 78 mL/min/{1.73_m2} (ref >59)
GFR calc non Af Amer: 68 mL/min/{1.73_m2} (ref >59)
Globulin, Total: 2.7 g/dL (ref 1.5–4.5)
Glucose: 96 mg/dL (ref 65–99)
Potassium: 4.5 mmol/L (ref 3.5–5.2)
Sodium: 140 mmol/L (ref 134–144)
Total Protein: 7.4 g/dL (ref 6.0–8.5)

## 2019-10-04 LAB — HEMOGLOBIN A1C
Est. average glucose Bld gHb Est-mCnc: 120 mg/dL
Hgb A1c MFr Bld: 5.8 % — ABNORMAL HIGH (ref 4.8–5.6)

## 2019-10-04 LAB — LIPID PANEL
Chol/HDL Ratio: 2.5 ratio (ref 0.0–5.0)
Cholesterol, Total: 129 mg/dL (ref 100–199)
HDL: 51 mg/dL (ref >39)
LDL Chol Calc (NIH): 64 mg/dL (ref 0–99)
Triglycerides: 69 mg/dL (ref 0–149)
VLDL Cholesterol Cal: 14 mg/dL (ref 5–40)

## 2019-11-01 ENCOUNTER — Encounter: Payer: Self-pay | Admitting: Sports Medicine

## 2019-11-01 ENCOUNTER — Other Ambulatory Visit: Payer: Self-pay

## 2019-11-01 ENCOUNTER — Ambulatory Visit: Payer: 59 | Admitting: Sports Medicine

## 2019-11-01 DIAGNOSIS — M79674 Pain in right toe(s): Secondary | ICD-10-CM | POA: Diagnosis not present

## 2019-11-01 DIAGNOSIS — B351 Tinea unguium: Secondary | ICD-10-CM

## 2019-11-01 MED ORDER — TERBINAFINE HCL 250 MG PO TABS: 250.0000 mg | ORAL_TABLET | Freq: Every day | ORAL | 0 refills | Status: DC

## 2019-11-01 NOTE — Progress Notes (Signed)
Subjective: Justin Farley is a 59 y.o. male patient seen today in office for fungal culture results. Patient has no other pedal complaints at this time.   Patient Active Problem List   Diagnosis Date Noted  . Internal hemorrhoids with grade 2 prolapse 11/28/2014  . HTN (hypertension) 04/19/2013  . GERD (gastroesophageal reflux disease) 04/19/2013    Current Outpatient Medications on File Prior to Visit  Medication Sig Dispense Refill  . albuterol (VENTOLIN HFA) 108 (90 Base) MCG/ACT inhaler Inhale 2 puffs into the lungs every 4 (four) hours as needed for wheezing. 18 g 1  . hydrochlorothiazide (MICROZIDE) 12.5 MG capsule TAKE 1 CAPSULE EVERY MORNING. 90 capsule 3  . omeprazole (PRILOSEC) 40 MG capsule TAKE 1 CAPSULE (40 MG TOTAL) BY MOUTH DAILY. 90 capsule 1   No current facility-administered medications on file prior to visit.    Allergies  Allergen Reactions  . Shellfish Allergy     Objective: Physical Exam  General: Well developed, nourished, no acute distress, awake, alert and oriented x 3  Vascular: Dorsalis pedis artery 2/4 bilateral, Posterior tibial artery 2/4 bilateral, skin temperature warm to warm proximal to distal bilateral lower extremities, no varicosities, pedal hair present bilateral.  Neurological: Gross sensation present via light touch bilateral.   Dermatological: Skin is warm, dry, and supple bilateral, Nails at right 1st, 4-5 are tender, short thick, and discolored with mild subungal debris, no webspace macerations present bilateral, no open lesions present bilateral, no callus/corns/hyperkeratotic tissue present bilateral. No signs of infection bilateral.  Musculoskeletal: No boney deformities noted bilateral. Muscular strength within normal limits without painon range of motion. No pain with calf compression bilateral.  Fungal culture + T Rubrum  Assessment and Plan:  Problem List Items Addressed This Visit    None    Visit Diagnoses     Dermatophytosis of nail    -  Primary   Relevant Medications   terbinafine (LAMISIL) 250 MG tablet   Other Relevant Orders   Hepatic Function Panel   Toe pain, right          -Examined patient -Discussed treatment options for painful mycotic nails -Patient opt for oral Lamisil with full understanding of medication risks; ordered LFTs for review and sent Rx to pharmacy for lamisil 250mg  PO daily. Anticipate 12 week course. Will call patient if labs are abnormal and if has to discontinue -Advised good hygiene habits -Patient to return in 6 weeks for follow up evaluation or sooner if symptoms worsen.  Landis Martins, DPM

## 2019-12-08 ENCOUNTER — Ambulatory Visit (INDEPENDENT_AMBULATORY_CARE_PROVIDER_SITE_OTHER): Payer: 59 | Admitting: Family Medicine

## 2019-12-08 ENCOUNTER — Ambulatory Visit: Payer: 59 | Admitting: Family Medicine

## 2019-12-08 ENCOUNTER — Encounter: Payer: Self-pay | Admitting: Family Medicine

## 2019-12-08 ENCOUNTER — Other Ambulatory Visit: Payer: Self-pay

## 2019-12-08 ENCOUNTER — Ambulatory Visit (INDEPENDENT_AMBULATORY_CARE_PROVIDER_SITE_OTHER): Payer: 59

## 2019-12-08 VITALS — BP 130/86 | HR 87 | Temp 97.4°F | Ht 64.0 in | Wt 160.0 lb

## 2019-12-08 DIAGNOSIS — R079 Chest pain, unspecified: Secondary | ICD-10-CM

## 2019-12-08 DIAGNOSIS — K625 Hemorrhage of anus and rectum: Secondary | ICD-10-CM

## 2019-12-08 DIAGNOSIS — N50812 Left testicular pain: Secondary | ICD-10-CM | POA: Diagnosis not present

## 2019-12-08 LAB — POCT CBC
Granulocyte percent: 52.8 %G (ref 37–80)
HCT, POC: 44.1 % — AB (ref 29–41)
Hemoglobin: 14.4 g/dL (ref 11–14.6)
Lymph, poc: 1.7 (ref 0.6–3.4)
MCH, POC: 29.3 pg (ref 27–31.2)
MCHC: 32.7 g/dL (ref 31.8–35.4)
MCV: 89.6 fL (ref 76–111)
MID (cbc): 0.2 (ref 0–0.9)
MPV: 7.2 fL (ref 0–99.8)
POC Granulocyte: 2.2 (ref 2–6.9)
POC LYMPH PERCENT: 42.3 %L (ref 10–50)
POC MID %: 4.9 %M (ref 0–12)
Platelet Count, POC: 186 10*3/uL (ref 142–424)
RBC: 4.93 M/uL (ref 4.69–6.13)
RDW, POC: 13.8 %
WBC: 4.1 10*3/uL — AB (ref 4.6–10.2)

## 2019-12-08 NOTE — Patient Instructions (Addendum)
See information below on chest pain.  Okay to continue your heartburn medicine once per day for now.  If any return of chest pain I would like you to meet with cardiology.  If any acute worsening of your symptoms proceed to the emergency room or call 911. I will let you know if there are any concerns on xray.   Previous bright red blood could have been related to hemorrhoids but if that returns, I would like you to meet with gastroenterology.  If testicular pain does not continue to resolve over the next day or 2, or any worsening symptoms return for recheck and other testing.  Return to the clinic or go to the nearest emergency room if any of your symptoms worsen or new symptoms occur.   Nonspecific Chest Pain, Adult Chest pain can be caused by many different conditions. It can be caused by a condition that is life-threatening and requires treatment right away. It can also be caused by something that is not life-threatening. If you have chest pain, it can be hard to know the difference, so it is important to get help right away to make sure that you do not have a serious condition. Some life-threatening causes of chest pain include:  Heart attack.  A tear in the body's main blood vessel (aortic dissection).  Inflammation around your heart (pericarditis).  A problem in the lungs, such as a blood clot (pulmonary embolism) or a collapsed lung (pneumothorax). Some non life-threatening causes of chest pain include:  Heartburn.  Anxiety or stress.  Damage to the bones, muscles, and cartilage that make up your chest wall.  Pneumonia or bronchitis.  Shingles infection (varicella-zoster virus). Chest pain can feel like:  Pain or discomfort on the surface of your chest or deep in your chest.  Crushing, pressure, aching, or squeezing pain.  Burning or tingling.  Dull or sharp pain that is worse when you move, cough, or take a deep breath.  Pain or discomfort that is also felt in your  back, neck, jaw, shoulder, or arm, or pain that spreads to any of these areas. Your chest pain may come and go. It may also be constant. Your health care provider will do lab tests and other studies to find the cause of your pain. Treatment will depend on the cause of your chest pain. Follow these instructions at home: Medicines  Take over-the-counter and prescription medicines only as told by your health care provider.  If you were prescribed an antibiotic, take it as told by your health care provider. Do not stop taking the antibiotic even if you start to feel better. Lifestyle   Rest as directed by your health care provider.  Do not use any products that contain nicotine or tobacco, such as cigarettes and e-cigarettes. If you need help quitting, ask your health care provider.  Do not drink alcohol.  Make healthy lifestyle choices as recommended. These may include: ? Getting regular exercise. Ask your health care provider to suggest some activities that are safe for you. ? Eating a heart-healthy diet. This includes plenty of fresh fruits and vegetables, whole grains, low-fat (lean) protein, and low-fat dairy products. A dietitian can help you find healthy eating options. ? Maintaining a healthy weight. ? Managing any other health conditions you have, such as high blood pressure (hypertension) or diabetes. ? Reducing stress, such as with yoga or relaxation techniques. General instructions  Pay attention to any changes in your symptoms. Tell your health care provider about  them or any new symptoms.  Avoid any activities that cause chest pain.  Keep all follow-up visits as told by your health care provider. This is important. This includes visits for any further testing if your chest pain does not go away. Contact a health care provider if:  Your chest pain does not go away.  You feel depressed.  You have a fever. Get help right away if:  Your chest pain gets worse.  You have  a cough that gets worse, or you cough up blood.  You have severe pain in your abdomen.  You faint.  You have sudden, unexplained chest discomfort.  You have sudden, unexplained discomfort in your arms, back, neck, or jaw.  You have shortness of breath at any time.  You suddenly start to sweat, or your skin gets clammy.  You feel nausea or you vomit.  You suddenly feel lightheaded or dizzy.  You have severe weakness, or unexplained weakness or fatigue.  Your heart begins to beat quickly, or it feels like it is skipping beats. These symptoms may represent a serious problem that is an emergency. Do not wait to see if the symptoms will go away. Get medical help right away. Call your local emergency services (911 in the [place] Do not drive yourself to the hospital. Summary  Chest pain can be caused by a condition that is serious and requires urgent treatment. It may also be caused by something that is not life-threatening.  If you have chest pain, it is very important to see your health care provider. Your health care provider may do lab tests and other studies to find the cause of your pain.  Follow your health care provider's instructions on taking medicines, making lifestyle changes, and getting emergency treatment if symptoms become worse.  Keep all follow-up visits as told by your health care provider. This includes visits for any further testing if your chest pain does not go away. This information is not intended to replace advice given to you by your health care provider. Make sure you discuss any questions you have with your health care provider. Document Revised: 11/19/2017 Document Reviewed: 11/19/2017 Elsevier Patient Education  [employer].   If you have lab work done today you will be contacted with your lab results within the next 2 weeks.  If you have not heard from [place] then please contact us. The fastest way to get your results is to register for My  Chart.   IF you received an x-ray today, you will receive an invoice from [facility]. Please contact [facility] at [phone] with questions or concerns regarding your invoice.   IF you received labwork today, you will receive an invoice from [place]. Please contact [facility] at [phone] with questions or concerns regarding your invoice.   Our billing staff will not be able to assist you with questions regarding bills from these companies.  You will be contacted with the lab results as soon as they are available. The fastest way to get your results is to activate your My Chart account. Instructions are located on the last page of this paperwork. If you have not heard from [place] regarding the results in 2 weeks, please contact this office.

## 2019-12-08 NOTE — Progress Notes (Signed)
Subjective:  Patient ID: Justin Farley, male    DOB: 1960/08/27  Age: 59 y.o. MRN: [id number]  CC:  Chief Complaint  Patient presents with  . heart burn    Pt states he knows he has Heart burn, but pt reports a sharp burning in the R side of abdominalarea and the sharp burning pain will jump to right below the pt's sternum. pt reports no chest pain or tightness. pt states this started on 12/03/2019 until 12/07/2019 pt states he hasn't felt it at all today. Pt reports not eatting or drinking anything out side he normal and he hasn't had any spicy food.    HPI Justin Farley presents for   Heartburn, chest pain: Initially started with heartburn. Takes omeprazole as needed - took one and heartburn improved, but noticed sharp pain on R lower front of chest and lower sternal area 6 days ago. Nonexertional. Taking omeprazole daily now.  No chest pain with exercise.  Sharp, unable to reproduce pushing on area. Noticing daily. Lasts a second or two each time. Few times per day. Last felt yesterday evening.  No associated arm radiation/diaphoresis/dyspnea/n/v.  No abd pain. No flank pain. No known history of kidney stone. No urinary symptoms, no hematuria.  No no dark tar colored stools or recent blood in stool. Blood in stool few weeks ago - new med at that time lamisil, stopped and no recurrence. No constipation/straining but prior hemorrhoids. No recent GI eval.  No chest pain since yesterday.  Normal colonoscopy in 2014.  Hx of HTN, no history of heart disease.   Groin pain yesterday in testicle area, left testicle, then improved today with slight discomfort. No penile d/c, no new sexual contacts.   History Patient Active Problem List   Diagnosis Date Noted  . Internal hemorrhoids with grade 2 prolapse 11/28/2014  . HTN (hypertension) 04/19/2013  . GERD (gastroesophageal reflux disease) 04/19/2013   Past Medical History:  Diagnosis Date  . Allergy   . Esophagitis   . Gastritis    . GERD (gastroesophageal reflux disease)   . Hypertension   . Internal hemorrhoids with grade 2 prolapse 11/28/2014   Past Surgical History:  Procedure Laterality Date  . COLONOSCOPY    . ESOPHAGOGASTRODUODENOSCOPY    . HEMORRHOID BANDING    . WISDOM TOOTH EXTRACTION  2009   Allergies  Allergen Reactions  . Shellfish Allergy    Prior to Admission medications   Medication Sig Start Date End Date Taking? Authorizing Provider  albuterol (VENTOLIN HFA) 108 (90 Base) MCG/ACT inhaler Inhale 2 puffs into the lungs every 4 (four) hours as needed for wheezing. 10/03/19  Yes Wendie Agreste, MD  hydrochlorothiazide (MICROZIDE) 12.5 MG capsule TAKE 1 CAPSULE EVERY MORNING. 10/03/19  Yes Wendie Agreste, MD  omeprazole (PRILOSEC) 40 MG capsule TAKE 1 CAPSULE (40 MG TOTAL) BY MOUTH DAILY. 10/03/19  Yes Wendie Agreste, MD  terbinafine (LAMISIL) 250 MG tablet Take 1 tablet (250 mg total) by mouth daily. Patient not taking: Reported on 12/08/2019 11/01/19   Landis Martins, DPM   Social History   Socioeconomic History  . Marital status: Single    Spouse name: Not on file  . Number of children: Not on file  . Years of education: Not on file  . Highest education level: Not on file  Occupational History  . Occupation: Freight forwarder  Tobacco Use  . Smoking status: Never Smoker  . Smokeless tobacco: Never Used  Substance and Sexual Activity  .  Alcohol use: No  . Drug use: No  . Sexual activity: Yes    Birth control/protection: Condom  Other Topics Concern  . Not on file  Social History Narrative   Single. Education: [employer]. Exercise: 3 days a week for 2 hours.   Social Determinants of Health   Financial Resource Strain:   . Difficulty of Paying Living Expenses:   Food Insecurity:   . Worried About Charity fundraiser in the Last Year:   . Arboriculturist in the Last Year:   Transportation Needs:   . Film/video editor (Medical):   Marland Kitchen Lack of Transportation (Non-Medical):    Physical Activity:   . Days of Exercise per Week:   . Minutes of Exercise per Session:   Stress:   . Feeling of Stress :   Social Connections:   . Frequency of Communication with Friends and Family:   . Frequency of Social Gatherings with Friends and Family:   . Attends Religious Services:   . Active Member of Clubs or Organizations:   . Attends Archivist Meetings:   Marland Kitchen Marital Status:   Intimate Partner Violence:   . Fear of Current or Ex-Partner:   . Emotionally Abused:   Marland Kitchen Physically Abused:   . Sexually Abused:     Review of Systems   Objective:   Vitals:   12/08/19 1113 12/08/19 1118  BP: (!) 143/94 130/86  Pulse: 87   Temp: (!) 97.4 F (36.3 C)   TempSrc: Temporal   SpO2: 99%   Weight: 160 lb (72.6 kg)   Height: 5\' 4"  (1.626 m)      Physical Exam  Results for orders placed or performed in visit on 12/08/19  POCT CBC  Result Value Ref Range   WBC 4.1 (A) 4.6 - 10.2 K/uL   Lymph, poc 1.7 0.6 - 3.4   POC LYMPH PERCENT 42.3 10 - 50 %L   MID (cbc) 0.2 0 - 0.9   POC MID % 4.9 0 - 12 %M   POC Granulocyte 2.2 2 - 6.9   Granulocyte percent 52.8 37 - 80 %G   RBC 4.93 4.69 - 6.13 M/uL   Hemoglobin 14.4 11 - 14.6 g/dL   HCT, POC 44.1 (A) 29 - 41 %   MCV 89.6 76 - 111 fL   MCH, POC 29.3 27 - 31.2 pg   MCHC 32.7 31.8 - 35.4 g/dL   RDW, POC 13.8 %   Platelet Count, POC 186.0 142 - 424 K/uL   MPV 7.2 0 - 99.8 fL   EKG: Sinus rhythm, rate 63, no prior available for review. No acute findings.   DG Chest 2 View  Result Date: 12/08/2019 CLINICAL DATA:  recent fleeting chest pain. EXAM: CHEST - 2 VIEW COMPARISON:  Chest radiograph 03/25/2010 FINDINGS: The heart size and mediastinal contours are within normal limits. The lungs are clear. No pneumothorax or pleural effusion. The visualized skeletal structures are unremarkable. IMPRESSION: No active cardiopulmonary disease. Electronically Signed   By: Audie Pinto M.D.   On: 12/08/2019 14:04     Assessment & Plan:  Justin Farley is a 59 y.o. male . Chest pain, unspecified type - Plan: DG Chest 2 View, POCT CBC, EKG 12-Lead  -Atypical, sharp pain, asymptomatic at present.  Unlikely cardiac, possibly reflux.  Reassuring EKG, chest x-ray, CBC.  RTC/ER precautions given as well as handout.  Okay to continue PPI for now  BRBPR (bright red blood per  rectum) - Plan: POCT CBC  -Possibly due to previous hemorrhoids, asymptomatic at present.  RTC precautions if recurrent.  Pain in left testicle  -No concerning findings exam, nontender epididymis, no apparent nodules or testicular masses, symptoms have improved.  RTC precautions if any persistent symptoms with ER precautions given if worsening  No orders of the defined types were placed in this encounter.  Patient Instructions   See information below on chest pain.  Okay to continue your heartburn medicine once per day for now.  If any return of chest pain I would like you to meet with cardiology.  If any acute worsening of your symptoms proceed to the emergency room or call 911. I will let you know if there are any concerns on xray.   Previous bright red blood could have been related to hemorrhoids but if that returns, I would like you to meet with gastroenterology.  If testicular pain does not continue to resolve over the next day or 2, or any worsening symptoms return for recheck and other testing.  Return to the clinic or go to the nearest emergency room if any of your symptoms worsen or new symptoms occur.   Nonspecific Chest Pain, Adult Chest pain can be caused by many different conditions. It can be caused by a condition that is life-threatening and requires treatment right away. It can also be caused by something that is not life-threatening. If you have chest pain, it can be hard to know the difference, so it is important to get help right away to make sure that you do not have a serious condition. Some life-threatening  causes of chest pain include:  Heart attack.  A tear in the body's main blood vessel (aortic dissection).  Inflammation around your heart (pericarditis).  A problem in the lungs, such as a blood clot (pulmonary embolism) or a collapsed lung (pneumothorax). Some non life-threatening causes of chest pain include:  Heartburn.  Anxiety or stress.  Damage to the bones, muscles, and cartilage that make up your chest wall.  Pneumonia or bronchitis.  Shingles infection (varicella-zoster virus). Chest pain can feel like:  Pain or discomfort on the surface of your chest or deep in your chest.  Crushing, pressure, aching, or squeezing pain.  Burning or tingling.  Dull or sharp pain that is worse when you move, cough, or take a deep breath.  Pain or discomfort that is also felt in your back, neck, jaw, shoulder, or arm, or pain that spreads to any of these areas. Your chest pain may come and go. It may also be constant. Your health care provider will do lab tests and other studies to find the cause of your pain. Treatment will depend on the cause of your chest pain. Follow these instructions at home: Medicines  Take over-the-counter and prescription medicines only as told by your health care provider.  If you were prescribed an antibiotic, take it as told by your health care provider. Do not stop taking the antibiotic even if you start to feel better. Lifestyle   Rest as directed by your health care provider.  Do not use any products that contain nicotine or tobacco, such as cigarettes and e-cigarettes. If you need help quitting, ask your health care provider.  Do not drink alcohol.  Make healthy lifestyle choices as recommended. These may include: ? Getting regular exercise. Ask your health care provider to suggest some activities that are safe for you. ? Eating a heart-healthy diet. This includes plenty of  fresh fruits and vegetables, whole grains, low-fat (lean) protein, and  low-fat dairy products. A dietitian can help you find healthy eating options. ? Maintaining a healthy weight. ? Managing any other health conditions you have, such as high blood pressure (hypertension) or diabetes. ? Reducing stress, such as with yoga or relaxation techniques. General instructions  Pay attention to any changes in your symptoms. Tell your health care provider about them or any new symptoms.  Avoid any activities that cause chest pain.  Keep all follow-up visits as told by your health care provider. This is important. This includes visits for any further testing if your chest pain does not go away. Contact a health care provider if:  Your chest pain does not go away.  You feel depressed.  You have a fever. Get help right away if:  Your chest pain gets worse.  You have a cough that gets worse, or you cough up blood.  You have severe pain in your abdomen.  You faint.  You have sudden, unexplained chest discomfort.  You have sudden, unexplained discomfort in your arms, back, neck, or jaw.  You have shortness of breath at any time.  You suddenly start to sweat, or your skin gets clammy.  You feel nausea or you vomit.  You suddenly feel lightheaded or dizzy.  You have severe weakness, or unexplained weakness or fatigue.  Your heart begins to beat quickly, or it feels like it is skipping beats. These symptoms may represent a serious problem that is an emergency. Do not wait to see if the symptoms will go away. Get medical help right away. Call your local emergency services (911 in the [place] Do not drive yourself to the hospital. Summary  Chest pain can be caused by a condition that is serious and requires urgent treatment. It may also be caused by something that is not life-threatening.  If you have chest pain, it is very important to see your health care provider. Your health care provider may do lab tests and other studies to find the cause of your  pain.  Follow your health care provider's instructions on taking medicines, making lifestyle changes, and getting emergency treatment if symptoms become worse.  Keep all follow-up visits as told by your health care provider. This includes visits for any further testing if your chest pain does not go away. This information is not intended to replace advice given to you by your health care provider. Make sure you discuss any questions you have with your health care provider. Document Revised: 11/19/2017 Document Reviewed: 11/19/2017 Elsevier Patient Education  [employer].   If you have lab work done today you will be contacted with your lab results within the next 2 weeks.  If you have not heard from [place] then please contact us. The fastest way to get your results is to register for My Chart.   IF you received an x-ray today, you will receive an invoice from [facility]. Please contact [facility] at [phone] with questions or concerns regarding your invoice.   IF you received labwork today, you will receive an invoice from [place]. Please contact [facility] at [phone] with questions or concerns regarding your invoice.   Our billing staff will not be able to assist you with questions regarding bills from these companies.  You will be contacted with the lab results as soon as they are available. The fastest way to get your results is to activate your My Chart account. Instructions are located  on the last page of this paperwork. If you have not heard from [place] regarding the results in 2 weeks, please contact this office.         Signed, [name], MD [facility]

## 2019-12-15 ENCOUNTER — Telehealth: Payer: Self-pay | Admitting: Sports Medicine

## 2019-12-15 NOTE — Telephone Encounter (Signed)
Yes topical or Laser are his only other treatment options if he does not want to take PO Lamisil

## 2019-12-15 NOTE — Telephone Encounter (Signed)
Pt called stating that he has not gotten updated blood work but he also states that the medication that he was given for the lamisil it does not work for him he stated was there another option please assist

## 2019-12-17 LAB — HEPATIC FUNCTION PANEL
AG Ratio: 1.6 (calc) (ref 1.0–2.5)
ALT: 21 U/L (ref 9–46)
AST: 17 U/L (ref 10–35)
Albumin: 4.4 g/dL (ref 3.6–5.1)
Alkaline phosphatase (APISO): 28 U/L — ABNORMAL LOW (ref 35–144)
Bilirubin, Direct: 0.2 mg/dL (ref 0.0–0.2)
Globulin: 2.8 g/dL (calc) (ref 1.9–3.7)
Indirect Bilirubin: 0.8 mg/dL (calc) (ref 0.2–1.2)
Total Bilirubin: 1 mg/dL (ref 0.2–1.2)
Total Protein: 7.2 g/dL (ref 6.1–8.1)

## 2019-12-20 ENCOUNTER — Ambulatory Visit: Payer: 59 | Admitting: Sports Medicine

## 2019-12-20 ENCOUNTER — Other Ambulatory Visit: Payer: Self-pay

## 2019-12-20 ENCOUNTER — Encounter: Payer: Self-pay | Admitting: Sports Medicine

## 2019-12-20 DIAGNOSIS — B351 Tinea unguium: Secondary | ICD-10-CM | POA: Diagnosis not present

## 2019-12-20 DIAGNOSIS — M79674 Pain in right toe(s): Secondary | ICD-10-CM | POA: Diagnosis not present

## 2019-12-20 NOTE — Telephone Encounter (Signed)
Left message requesting call back to discuss treatment options.

## 2019-12-20 NOTE — Progress Notes (Signed)
Subjective: Justin Farley is a 59 y.o. male patient seen today in office for follow-up evaluation of toenail fungus.  Patient reports that he cannot take the Lamisil medication every time he tried ended up with sharp stomach pains so had to discontinue the medication.  Patient reports that he probably got about a week's worth of the medication into his system at first the 3 days he initially took the medicine back to back and then he tried to space it out to see if that would help but the pain in his belly continued.  Denies nausea vomiting fever chills or any other constitutional symptoms.  Patient is here to discuss other treatment options for nail fungus.  Patient has no other pedal complaints at this time.   Patient Active Problem List   Diagnosis Date Noted  . Internal hemorrhoids with grade 2 prolapse 11/28/2014  . HTN (hypertension) 04/19/2013  . GERD (gastroesophageal reflux disease) 04/19/2013    Current Outpatient Medications on File Prior to Visit  Medication Sig Dispense Refill  . albuterol (VENTOLIN HFA) 108 (90 Base) MCG/ACT inhaler Inhale 2 puffs into the lungs every 4 (four) hours as needed for wheezing. 18 g 1  . hydrochlorothiazide (MICROZIDE) 12.5 MG capsule TAKE 1 CAPSULE EVERY MORNING. 90 capsule 3  . omeprazole (PRILOSEC) 40 MG capsule TAKE 1 CAPSULE (40 MG TOTAL) BY MOUTH DAILY. 90 capsule 1   No current facility-administered medications on file prior to visit.    Allergies  Allergen Reactions  . Lamisil [Terbinafine]     Sharp stomach pain  . Shellfish Allergy     Objective: Physical Exam  General: Well developed, nourished, no acute distress, awake, alert and oriented x 3  Vascular: Dorsalis pedis artery 2/4 bilateral, Posterior tibial artery 2/4 bilateral, skin temperature warm to warm proximal to distal bilateral lower extremities, no varicosities, pedal hair present bilateral.  Neurological: Gross sensation present via light touch bilateral.    Dermatological: Skin is warm, dry, and supple bilateral, Nails at right 1st>4th<5th are tender, short thick, and discolored with mild subungal debris, no webspace macerations present bilateral, no open lesions present bilateral, no callus/corns/hyperkeratotic tissue present bilateral. No signs of infection bilateral.  Musculoskeletal: No symptomatic boney deformities noted bilateral. Muscular strength within normal limits without painon range of motion. No pain with calf compression bilateral.   Assessment and Plan:  Problem List Items Addressed This Visit    None    Visit Diagnoses    Dermatophytosis of nail    -  Primary   Toe pain, right          -Examined patient -Discussed treatment options for painful mycotic nails -Advised patient to try laser for his toenails since he is intolerant to oral Lamisil -Advised good hygiene habits -Patient to return when ready for laser for nail fungus or sooner if symptoms worsen.  Landis Martins, DPM

## 2019-12-27 NOTE — Telephone Encounter (Signed)
Pt called states he had received a message that Caryl Pina, [employer] would be in on Saturday and he would like to schedule for that day.

## 2019-12-31 ENCOUNTER — Ambulatory Visit (INDEPENDENT_AMBULATORY_CARE_PROVIDER_SITE_OTHER): Payer: 59 | Admitting: *Deleted

## 2019-12-31 ENCOUNTER — Other Ambulatory Visit: Payer: Self-pay

## 2019-12-31 DIAGNOSIS — M79676 Pain in unspecified toe(s): Secondary | ICD-10-CM

## 2019-12-31 DIAGNOSIS — B351 Tinea unguium: Secondary | ICD-10-CM

## 2019-12-31 NOTE — Patient Instructions (Signed)

## 2019-12-31 NOTE — Progress Notes (Signed)
Patient presents today for the 1st laser treatment. Diagnosed with mycotic nail infection by Dr. Cannon Kettle.   Toenail most affected are the 1st and the 5th nails right.  All other systems are negative.  Nails were filed thin. Laser therapy was administered to 1-5 toenails right and patient tolerated the treatment well. All safety precautions were in place.   Patient attempted taking oral terbinafine, but had severe stomach pains and was advised to discontinue.   Follow up in 4 weeks for laser # 2.  Picture of nails taken today to document visual progress

## 2020-01-05 ENCOUNTER — Other Ambulatory Visit: Payer: 59

## 2020-02-03 ENCOUNTER — Other Ambulatory Visit: Payer: Self-pay

## 2020-02-03 ENCOUNTER — Ambulatory Visit (INDEPENDENT_AMBULATORY_CARE_PROVIDER_SITE_OTHER): Payer: 59 | Admitting: *Deleted

## 2020-02-03 DIAGNOSIS — B351 Tinea unguium: Secondary | ICD-10-CM

## 2020-02-03 NOTE — Progress Notes (Signed)
Patient presents today for the 2nd laser treatment. Diagnosed with mycotic nail infection by Dr. Cannon Kettle.   Toenail most affected are the 1st and the 5th nails right.  All other systems are negative.  Nails were filed thin. Laser therapy was administered to 1-5 toenails right and patient tolerated the treatment well. All safety precautions were in place.   Patient attempted taking oral terbinafine, but had severe stomach pains and was advised to discontinue.   Follow up in 4 weeks for laser # 3.

## 2020-02-29 ENCOUNTER — Encounter: Payer: Self-pay | Admitting: Registered Nurse

## 2020-02-29 ENCOUNTER — Ambulatory Visit (INDEPENDENT_AMBULATORY_CARE_PROVIDER_SITE_OTHER): Payer: 59 | Admitting: Registered Nurse

## 2020-02-29 ENCOUNTER — Other Ambulatory Visit: Payer: Self-pay

## 2020-02-29 VITALS — BP 130/79 | HR 89 | Temp 98.2°F | Resp 18 | Ht 64.0 in | Wt 165.2 lb

## 2020-02-29 DIAGNOSIS — L739 Follicular disorder, unspecified: Secondary | ICD-10-CM | POA: Diagnosis not present

## 2020-02-29 MED ORDER — BETAMETHASONE DIPROPIONATE 0.05 % EX CREA: TOPICAL_CREAM | Freq: Every day | CUTANEOUS | 0 refills | Status: DC

## 2020-02-29 NOTE — Patient Instructions (Signed)
° ° ° °  If you have lab work done today you will be contacted with your lab results within the next 2 weeks.  If you have not heard from us then please contact us. The fastest way to get your results is to register for My Chart. ° ° °IF you received an x-ray today, you will receive an invoice from [facility]. Please contact [facility] at [phone] with questions or concerns regarding your invoice.  ° °IF you received labwork today, you will receive an invoice from [facility]. Please contact [facility] at [phone] with questions or concerns regarding your invoice.  ° °Our billing staff will not be able to assist you with questions regarding bills from these companies. ° °You will be contacted with the lab results as soon as they are available. The fastest way to get your results is to activate your My Chart account. Instructions are located on the last page of this paperwork. If you have not heard from us regarding the results in 2 weeks, please contact this office. °  ° ° ° °

## 2020-02-29 NOTE — Progress Notes (Signed)
Established Patient Office Visit  Subjective:  Patient ID: Justin Farley, male    DOB: [birth year]  Age: 59 y.o. MRN: [id number]  CC:  Chief Complaint  Patient presents with  . Nevus    patient states that he has noticed what he thought was an blackhead  under chin but believe it is a mole and would like to figure out what it is exactly. Per patient it has been there for about 3 weeks    HPI Justin Farley presents for folliculitis. Was shaving neck and felt he hit a bump Thought it was a blackhead at first but now more firm No drainage, redness, tenderness No local lymph involvement No other concerns or complaints  Past Medical History:  Diagnosis Date  . Allergy   . Esophagitis   . Gastritis   . GERD (gastroesophageal reflux disease)   . Hypertension   . Internal hemorrhoids with grade 2 prolapse 11/28/2014    Past Surgical History:  Procedure Laterality Date  . COLONOSCOPY    . ESOPHAGOGASTRODUODENOSCOPY    . HEMORRHOID BANDING    . WISDOM TOOTH EXTRACTION  2009    Family History  Problem Relation Age of Onset  . Hypertension Mother   . Hypertension Other   . Colon cancer Neg Hx     Social History   Socioeconomic History  . Marital status: Single    Spouse name: Not on file  . Number of children: Not on file  . Years of education: Not on file  . Highest education level: Not on file  Occupational History  . Occupation: Freight forwarder  Tobacco Use  . Smoking status: Never Smoker  . Smokeless tobacco: Never Used  Substance and Sexual Activity  . Alcohol use: No  . Drug use: No  . Sexual activity: Yes    Birth control/protection: Condom  Other Topics Concern  . Not on file  Social History Narrative   Single. Education: [employer]. Exercise: 3 days a week for 2 hours.   Social Determinants of Health   Financial Resource Strain:   . Difficulty of Paying Living Expenses: Not on file  Food Insecurity:   . Worried About Charity fundraiser  in the Last Year: Not on file  . Ran Out of Food in the Last Year: Not on file  Transportation Needs:   . Lack of Transportation (Medical): Not on file  . Lack of Transportation (Non-Medical): Not on file  Physical Activity:   . Days of Exercise per Week: Not on file  . Minutes of Exercise per Session: Not on file  Stress:   . Feeling of Stress : Not on file  Social Connections:   . Frequency of Communication with Friends and Family: Not on file  . Frequency of Social Gatherings with Friends and Family: Not on file  . Attends Religious Services: Not on file  . Active Member of Clubs or Organizations: Not on file  . Attends Archivist Meetings: Not on file  . Marital Status: Not on file  Intimate Partner Violence:   . Fear of Current or Ex-Partner: Not on file  . Emotionally Abused: Not on file  . Physically Abused: Not on file  . Sexually Abused: Not on file    Outpatient Medications Prior to Visit  Medication Sig Dispense Refill  . albuterol (VENTOLIN HFA) 108 (90 Base) MCG/ACT inhaler Inhale 2 puffs into the lungs every 4 (four) hours as needed for wheezing. 18 g 1  .  hydrochlorothiazide (MICROZIDE) 12.5 MG capsule TAKE 1 CAPSULE EVERY MORNING. 90 capsule 3  . omeprazole (PRILOSEC) 40 MG capsule TAKE 1 CAPSULE (40 MG TOTAL) BY MOUTH DAILY. 90 capsule 1   No facility-administered medications prior to visit.    Allergies  Allergen Reactions  . Lamisil [Terbinafine]     Sharp stomach pain  . Shellfish Allergy     ROS Review of Systems  Constitutional: Negative.   HENT: Negative.   Eyes: Negative.   Respiratory: Negative.   Cardiovascular: Negative.   Gastrointestinal: Negative.   Genitourinary: Negative.   Musculoskeletal: Negative.   Skin: Negative.   Neurological: Negative.   Psychiatric/Behavioral: Negative.       Objective:    Physical Exam Vitals and nursing note reviewed.  Constitutional:      Appearance: Normal appearance. He is normal  weight.  Skin:    General: Skin is warm and dry.     Coloration: Skin is not jaundiced or pale.     Findings: Lesion (folliculitis lesion on neck on r side) present. No bruising, erythema or rash.  Neurological:     General: No focal deficit present.     Mental Status: He is alert. Mental status is at baseline.  Psychiatric:        Mood and Affect: Mood normal.        Behavior: Behavior normal.        Thought Content: Thought content normal.        Judgment: Judgment normal.     BP 130/79   Pulse 89   Temp 98.2 F (36.8 C) (Temporal)   Resp 18   Ht 5\' 4"  (1.626 m)   Wt 165 lb 3.2 oz (74.9 kg)   SpO2 98%   BMI 28.36 kg/m  Wt Readings from Last 3 Encounters:  02/29/20 165 lb 3.2 oz (74.9 kg)  12/08/19 160 lb (72.6 kg)  10/03/19 165 lb (74.8 kg)     There are no preventive care reminders to display for this patient.  There are no preventive care reminders to display for this patient.  Lab Results  Component Value Date   TSH 1.490 04/04/2019   Lab Results  Component Value Date   WBC 4.1 (A) 12/08/2019   HGB 14.4 12/08/2019   HCT 44.1 (A) 12/08/2019   MCV 89.6 12/08/2019   PLT 185 04/04/2019   Lab Results  Component Value Date   NA 140 10/03/2019   K 4.5 10/03/2019   CO2 20 10/03/2019   GLUCOSE 96 10/03/2019   BUN 17 10/03/2019   CREATININE 1.19 10/03/2019   BILITOT 1.0 12/16/2019   ALKPHOS 33 (L) 10/03/2019   AST 17 12/16/2019   ALT 21 12/16/2019   PROT 7.2 12/16/2019   ALBUMIN 4.7 10/03/2019   CALCIUM 9.4 10/03/2019   Lab Results  Component Value Date   CHOL 129 10/03/2019   Lab Results  Component Value Date   HDL 51 10/03/2019   Lab Results  Component Value Date   LDLCALC 64 10/03/2019   Lab Results  Component Value Date   TRIG 69 10/03/2019   Lab Results  Component Value Date   CHOLHDL 2.5 10/03/2019   Lab Results  Component Value Date   HGBA1C 5.8 (H) 10/03/2019      Assessment & Plan:   Problem List Items Addressed This  Visit    None    Visit Diagnoses    Folliculitis    -  Primary   Relevant Medications  betamethasone dipropionate 0.05 % cream   Other Relevant Orders   Ambulatory referral to Dermatology      Meds ordered this encounter  Medications  . betamethasone dipropionate 0.05 % cream    Sig: Apply topically daily.    Dispense:  30 g    Refill:  0    Order Specific Question:   Supervising Provider    Answer:   Carlota Raspberry, JEFFREY R [[id number]]    Follow-up: No follow-ups on file.   PLAN  Betamethasone 1-2 times daily  Will refer to derm  Return prn  Patient encouraged to call clinic with any questions, comments, or concerns.  Maximiano Coss, NP

## 2020-03-02 ENCOUNTER — Other Ambulatory Visit: Payer: Self-pay

## 2020-03-02 ENCOUNTER — Ambulatory Visit (INDEPENDENT_AMBULATORY_CARE_PROVIDER_SITE_OTHER): Payer: 59 | Admitting: *Deleted

## 2020-03-02 DIAGNOSIS — B351 Tinea unguium: Secondary | ICD-10-CM

## 2020-03-02 NOTE — Progress Notes (Signed)
Patient presents today for the 3rd laser treatment. Diagnosed with mycotic nail infection by Dr. Cannon Kettle.   Toenail most affected are the 1st and the 5th nails right.  The nails are looking much better. There is quite a bit of new growth already. He is very pleased.   All other systems are negative.  Nails were filed thin. Laser therapy was administered to 1-5 toenails right and patient tolerated the treatment well. All safety precautions were in place.   Patient attempted taking oral terbinafine, but had severe stomach pains and was advised to discontinue.   Follow up in 6 weeks for laser # 4.

## 2020-04-06 ENCOUNTER — Other Ambulatory Visit: Payer: Self-pay

## 2020-04-06 ENCOUNTER — Ambulatory Visit (INDEPENDENT_AMBULATORY_CARE_PROVIDER_SITE_OTHER): Payer: 59 | Admitting: *Deleted

## 2020-04-06 DIAGNOSIS — B351 Tinea unguium: Secondary | ICD-10-CM

## 2020-04-06 NOTE — Progress Notes (Signed)
Patient presents today for the 4th laser treatment. Diagnosed with mycotic nail infection by Dr. Cannon Kettle.   Toenail most affected are the 1st and the 5th nails right.  The nails are looking much better. Nails are improving.   All other systems are negative.  Nails were filed thin. Laser therapy was administered to 1-5 toenails right and patient tolerated the treatment well. All safety precautions were in place.   Patient attempted taking oral terbinafine, but had severe stomach pains and was advised to discontinue.   Follow up in 6 weeks for laser # 5.

## 2020-05-18 ENCOUNTER — Ambulatory Visit (INDEPENDENT_AMBULATORY_CARE_PROVIDER_SITE_OTHER): Payer: 59 | Admitting: *Deleted

## 2020-05-18 ENCOUNTER — Other Ambulatory Visit: Payer: Self-pay

## 2020-05-18 DIAGNOSIS — B351 Tinea unguium: Secondary | ICD-10-CM

## 2020-05-18 NOTE — Progress Notes (Signed)
Patient presents today for the 5th laser treatment. Diagnosed with mycotic nail infection by Dr. Cannon Kettle.   Toenail most affected are the 1st and the 5th nails right. The nails have improved a lot.  The nails are looking much better. Nails are improving.   All other systems are negative.  Nails were filed thin. Laser therapy was administered to 1-5 toenails right and patient tolerated the treatment well. All safety precautions were in place.   Patient attempted taking oral terbinafine, but had severe stomach pains and was advised to discontinue.   Follow up in 8 weeks for laser # 6.   ~Take final nail pics next visit~

## 2020-07-13 ENCOUNTER — Ambulatory Visit (INDEPENDENT_AMBULATORY_CARE_PROVIDER_SITE_OTHER): Payer: 59

## 2020-07-13 ENCOUNTER — Other Ambulatory Visit: Payer: Self-pay

## 2020-07-13 DIAGNOSIS — B351 Tinea unguium: Secondary | ICD-10-CM

## 2020-07-13 NOTE — Progress Notes (Signed)
Patient presents today for the 6th laser treatment. Diagnosed with mycotic nail infection by Dr. Cannon Kettle.   Toenail most affected are the 1st and the 5th nails right. The nails have improved a lot.  The nails are looking much better. Nails are improving.   All other systems are negative.  Nails were filed thin. Laser therapy was administered to 1-5 toenails right and patient tolerated the treatment well. All safety precautions were in place.   Patient attempted taking oral terbinafine, but had severe stomach pains and was advised to discontinue.   Follow up in Dr. Cannon Kettle in 3 months.

## 2020-07-19 ENCOUNTER — Other Ambulatory Visit: Payer: Self-pay

## 2020-07-19 ENCOUNTER — Ambulatory Visit: Payer: 59 | Admitting: Family Medicine

## 2020-07-19 ENCOUNTER — Encounter: Payer: Self-pay | Admitting: Family Medicine

## 2020-07-19 VITALS — BP 121/78 | HR 66 | Temp 98.3°F | Ht 64.0 in | Wt 167.0 lb

## 2020-07-19 DIAGNOSIS — K625 Hemorrhage of anus and rectum: Secondary | ICD-10-CM | POA: Diagnosis not present

## 2020-07-19 DIAGNOSIS — R739 Hyperglycemia, unspecified: Secondary | ICD-10-CM

## 2020-07-19 DIAGNOSIS — Z13220 Encounter for screening for lipoid disorders: Secondary | ICD-10-CM

## 2020-07-19 DIAGNOSIS — R7303 Prediabetes: Secondary | ICD-10-CM

## 2020-07-19 DIAGNOSIS — I10 Essential (primary) hypertension: Secondary | ICD-10-CM | POA: Diagnosis not present

## 2020-07-19 DIAGNOSIS — Z8719 Personal history of other diseases of the digestive system: Secondary | ICD-10-CM | POA: Diagnosis not present

## 2020-07-19 DIAGNOSIS — K2100 Gastro-esophageal reflux disease with esophagitis, without bleeding: Secondary | ICD-10-CM

## 2020-07-19 LAB — IFOBT (OCCULT BLOOD): IFOBT: NEGATIVE

## 2020-07-19 MED ORDER — HYDROCHLOROTHIAZIDE 12.5 MG PO CAPS: ORAL_CAPSULE | ORAL | 3 refills | Status: DC

## 2020-07-19 MED ORDER — OMEPRAZOLE 40 MG PO CPDR: DELAYED_RELEASE_CAPSULE | ORAL | 1 refills | Status: DC

## 2020-07-19 NOTE — Patient Instructions (Addendum)
Bright red blood 2 days ago was likely due to straining from the day before during exercise and hemorrhoids.  I do not see anything concerning on exam today.  See information below on hemorrhoids.  If the bleeding returns then I would recommend follow-up with gastroenterology.  Let me know if there are questions.  No change in blood pressure medication today.  I will check some lab work including the prediabetes test.  Follow-up in 6 months, let me know if there are questions  About Hemorrhoids  Hemorrhoids are swollen veins in the lower rectum and anus.  Also called piles, hemorrhoids are a common problem.  Hemorrhoids may be internal (inside the rectum) or external (around the anus).  Internal Hemorrhoids  Internal hemorrhoids are often painless, but they rarely cause bleeding.  The internal veins may stretch and fall down (prolapse) through the anus to the outside of the body.  The veins may then become irritated and painful.  External Hemorrhoids  External hemorrhoids can be easily seen or felt around the anal opening.  They are under the skin around the anus.  When the swollen veins are scratched or broken by straining, rubbing or wiping they sometimes bleed.  How Hemorrhoids Occur  Veins in the rectum and around the anus tend to swell under pressure.  Hemorrhoids can result from increased pressure in the veins of your anus or rectum.  Some sources of pressure are:   Straining to have a bowel movement because of constipation  Waiting too long to have a bowel movement  Coughing and sneezing often  Sitting for extended periods of time, including on the toilet  Diarrhea  Obesity  Trauma or injury to the anus  Some liver diseases  Stress  Family history of hemorrhoids  Pregnancy  Pregnant women should try to avoid becoming constipated, because they are more likely to have hemorrhoids during pregnancy.  In the last trimester of pregnancy, the enlarged uterus may press on  blood vessels and causes hemorrhoids.  In addition, the strain of childbirth sometimes causes hemorrhoids after the birth.  Symptoms of Hemorrhoids  Some symptoms of hemorrhoids include:  Swelling and/or a tender lump around the anus  Itching, mild burning and bleeding around the anus  Painful bowel movements with or without constipation  Bright red blood covering the stool, on toilet paper or in the toilet bowel.   Symptoms usually go away within a few days.  Always talk to your doctor about any bleeding to make sure it is not from some other causes.  Diagnosing and Treating Hemorrhoids  Diagnosis is made by an examination by your healthcare provider.  Special test can be performed by your doctor.    Most cases of hemorrhoids can be treated with:  High-fiber diet: Eat more high-fiber foods, which help prevent constipation.  Ask for more detailed fiber information on types and sources of fiber from your healthcare provider.  Fluids: Drink plenty of water.  This helps soften bowel movements so they are easier to pass.  Sitz baths and cold packs: Sitting in lukewarm water two or three times a day for 15 minutes cleases the anal area and may relieve discomfort.  If the water is too hot, swelling around the anus will get worse.  Placing a cloth-covered ice pack on the anus for ten minutes four times a day can also help reduce selling.  Gently pushing a prolapsed hemorrhoid back inside after the bath or ice pack can be helpful.  Medications: For  mild discomfort, your healthcare provider may suggest over-the-counter pain medication or prescribe a cream or ointment for topical use.  The cream may contain witch hazel, zinc oxide or petroleum jelly.  Medicated suppositories are also a treatment option.  Always consult your doctor before applying medications or creams.  Procedures and surgeries: There are also a number of procedures and surgeries to shrink or remove hemorrhoids in more serious  cases.  Talk to your physician about these options.  You can often prevent hemorrhoids or keep them from becoming worse by maintaining a healthy lifestyle.  Eat a fiber-rich diet of fruits, vegetables and whole grains.  Also, drink plenty of water and exercise regularly.   2007, [employer] Doc.30   If you have lab work done today you will be contacted with your lab results within the next 2 weeks.  If you have not heard from [place] then please contact us. The fastest way to get your results is to register for My Chart.   IF you received an x-ray today, you will receive an invoice from [facility]. Please contact [facility] at [phone] with questions or concerns regarding your invoice.   IF you received labwork today, you will receive an invoice from [place]. Please contact [facility] at [phone] with questions or concerns regarding your invoice.   Our billing staff will not be able to assist you with questions regarding bills from these companies.  You will be contacted with the lab results as soon as they are available. The fastest way to get your results is to activate your My Chart account. Instructions are located on the last page of this paperwork. If you have not heard from [place] regarding the results in 2 weeks, please contact this office.

## 2020-07-19 NOTE — Progress Notes (Signed)
Subjective:  Patient ID: Justin Farley, male    DOB: Oct 02, 1960  Age: 60 y.o. MRN: [id number]  CC:  Chief Complaint  Patient presents with  . possible hemroid    Pt reports he has a history of Hemorid in the past, but he hasn't had any of the usual symptoms of Hemorid. Pt reports he has noticed blood in his stool. PT reports having acid reflux and that he had some spicy food during the super bowl and thinks that might have caused the blood, but the pt just wants to be sure.    HPI COYT GOVONI presents for   Bright red blood in stool: Spicy food last weekend, history of reflux. Some heartburn afterwards. Took omeprazole (intermiitent use prior). Has been taking past few days.  Bright red blood noted with wiping 2 days ago. Not in toilet or in stool. No clots. No vomiting/hematemesis.  No diarrhea, no straining with BM or hard stools. Did return to gym - off fore a few weeks, weightlifting 3 days ago.  Did not see or feel hemorhoid. Reports history of hemorrhoids but no typical symptoms of hemorrhoids recently. Slight soreness at anus past few days - harder tissue at work. No abd pain, no fever.  No unexplained wt loss or night sweats.   No treatments.  No recurrence of blood past 2 days.  Normal colonoscopy in 05/2013 - repeat 10 yrs planned.    Hypertension: On Hydrochlorothiazide 12.5 mg daily. Home readings: None.  No chest pain, shortness of breath, or any side effects of medications. BP Readings from Last 3 Encounters:  07/19/20 121/78  02/29/20 130/79  12/08/19 130/86   Lab Results  Component Value Date   CREATININE 1.19 10/03/2019    Prediabetes: Recently returned to exercise after a brief break. Avoiding sugary-containing beverages, no fast food regularly. Lab Results  Component Value Date   HGBA1C 5.8 (H) 10/03/2019   Wt Readings from Last 3 Encounters:  07/19/20 167 lb (75.8 kg)  02/29/20 165 lb 3.2 oz (74.9 kg)  12/08/19 160 lb (72.6 kg)      History Patient Active Problem List   Diagnosis Date Noted  . Internal hemorrhoids with grade 2 prolapse 11/28/2014  . HTN (hypertension) 04/19/2013  . GERD (gastroesophageal reflux disease) 04/19/2013   Past Medical History:  Diagnosis Date  . Allergy   . Esophagitis   . Gastritis   . GERD (gastroesophageal reflux disease)   . Hypertension   . Internal hemorrhoids with grade 2 prolapse 11/28/2014   Past Surgical History:  Procedure Laterality Date  . COLONOSCOPY    . ESOPHAGOGASTRODUODENOSCOPY    . HEMORRHOID BANDING    . WISDOM TOOTH EXTRACTION  2009   Allergies  Allergen Reactions  . Lamisil [Terbinafine]     Sharp stomach pain  . Shellfish Allergy    Prior to Admission medications   Medication Sig Start Date End Date Taking? Authorizing Provider  albuterol (VENTOLIN HFA) 108 (90 Base) MCG/ACT inhaler Inhale 2 puffs into the lungs every 4 (four) hours as needed for wheezing. 10/03/19  Yes Wendie Agreste, MD  betamethasone dipropionate 0.05 % cream Apply topically daily. 02/29/20  Yes Maximiano Coss, NP  hydrochlorothiazide (MICROZIDE) 12.5 MG capsule TAKE 1 CAPSULE EVERY MORNING. 10/03/19  Yes Wendie Agreste, MD  omeprazole (PRILOSEC) 40 MG capsule TAKE 1 CAPSULE (40 MG TOTAL) BY MOUTH DAILY. 10/03/19  Yes Wendie Agreste, MD   Social History   Socioeconomic History  . Marital  status: Single    Spouse name: Not on file  . Number of children: Not on file  . Years of education: Not on file  . Highest education level: Not on file  Occupational History  . Occupation: Freight forwarder  Tobacco Use  . Smoking status: Never Smoker  . Smokeless tobacco: Never Used  Substance and Sexual Activity  . Alcohol use: No  . Drug use: No  . Sexual activity: Yes    Birth control/protection: Condom  Other Topics Concern  . Not on file  Social History Narrative   Single. Education: [employer]. Exercise: 3 days a week for 2 hours.   Social Determinants of Health    Financial Resource Strain: Not on file  Food Insecurity: Not on file  Transportation Needs: Not on file  Physical Activity: Not on file  Stress: Not on file  Social Connections: Not on file  Intimate Partner Violence: Not on file    Review of Systems  Constitutional: Negative for fatigue and unexpected weight change.  Eyes: Negative for visual disturbance.  Respiratory: Negative for cough, chest tightness and shortness of breath.   Cardiovascular: Negative for chest pain, palpitations and leg swelling.  Gastrointestinal: Negative for abdominal pain and blood in stool.  Neurological: Negative for dizziness, light-headedness and headaches.     Objective:   Vitals:   07/19/20 1336 07/19/20 1402  BP: 135/83 121/78  Pulse: 72 66  Temp: 98.6 F (37 C) 98.3 F (36.8 C)  TempSrc: Temporal Temporal  SpO2: 99% 97%  Weight: 161 lb (73 kg) 167 lb (75.8 kg)  Height: 5\' 4"  (1.626 m) 5\' 4"  (1.626 m)     Physical Exam Vitals reviewed.  Constitutional:      Appearance: He is well-developed and well-nourished.  HENT:     Head: Normocephalic and atraumatic.  Eyes:     Extraocular Movements: EOM normal.     Pupils: Pupils are equal, round, and reactive to light.  Neck:     Vascular: No carotid bruit or JVD.  Cardiovascular:     Rate and Rhythm: Normal rate and regular rhythm.     Heart sounds: Normal heart sounds. No murmur heard.   Pulmonary:     Effort: Pulmonary effort is normal.     Breath sounds: Normal breath sounds. No rales.  Genitourinary:    Comments: There is a small nonthrombosed external hemorrhoid, no anal fissures.  Nontender on exam.  Small amount of brown stool on DRE.  Musculoskeletal:        General: No edema.  Skin:    General: Skin is warm and dry.  Neurological:     Mental Status: He is alert and oriented to person, place, and time.  Psychiatric:        Mood and Affect: Mood and affect normal.        Assessment & Plan:  PARRIS CUDWORTH is  a 60 y.o. male . BRBPR (bright red blood per rectum) - Plan: IFOBT POC (occult bld, rslt in office) History of hemorrhoids - Plan: IFOBT POC (occult bld, rslt in office)  -Possibly flare of internal hemorrhoids with straining/lifting day prior.  Has not recurred in the past 2 days.  Reassuring exam.  Handout given on hemorrhoids, RTC precautions if persistent, and would recommend GI eval if so.  Essential hypertension - Plan: Comprehensive metabolic panel, hydrochlorothiazide (MICROZIDE) 12.5 MG capsule  -  Stable, tolerating current regimen. Medications refilled. Labs pending as above.   Hyperglycemia - Plan: Hemoglobin  A1c Prediabetes - Plan: Hemoglobin A1c  -Commended on diet/exercise.  Check A1c Screening for hyperlipidemia - Plan: Lipid panel  Gastroesophageal reflux disease with esophagitis, unspecified whether hemorrhage - Plan: omeprazole (PRILOSEC) 40 MG capsule  -Stable with intermittent dosing of PPI  Meds ordered this encounter  Medications  . hydrochlorothiazide (MICROZIDE) 12.5 MG capsule    Sig: TAKE 1 CAPSULE EVERY MORNING.    Dispense:  90 capsule    Refill:  3  . omeprazole (PRILOSEC) 40 MG capsule    Sig: TAKE 1 CAPSULE (40 MG TOTAL) BY MOUTH DAILY.as needed    Dispense:  90 capsule    Refill:  1   Patient Instructions    Bright red blood 2 days ago was likely due to straining from the day before during exercise and hemorrhoids.  I do not see anything concerning on exam today.  See information below on hemorrhoids.  If the bleeding returns then I would recommend follow-up with gastroenterology.  Let me know if there are questions.  No change in blood pressure medication today.  I will check some lab work including the prediabetes test.  Follow-up in 6 months, let me know if there are questions  About Hemorrhoids  Hemorrhoids are swollen veins in the lower rectum and anus.  Also called piles, hemorrhoids are a common problem.  Hemorrhoids may be internal (inside  the rectum) or external (around the anus).  Internal Hemorrhoids  Internal hemorrhoids are often painless, but they rarely cause bleeding.  The internal veins may stretch and fall down (prolapse) through the anus to the outside of the body.  The veins may then become irritated and painful.  External Hemorrhoids  External hemorrhoids can be easily seen or felt around the anal opening.  They are under the skin around the anus.  When the swollen veins are scratched or broken by straining, rubbing or wiping they sometimes bleed.  How Hemorrhoids Occur  Veins in the rectum and around the anus tend to swell under pressure.  Hemorrhoids can result from increased pressure in the veins of your anus or rectum.  Some sources of pressure are:   Straining to have a bowel movement because of constipation  Waiting too long to have a bowel movement  Coughing and sneezing often  Sitting for extended periods of time, including on the toilet  Diarrhea  Obesity  Trauma or injury to the anus  Some liver diseases  Stress  Family history of hemorrhoids  Pregnancy  Pregnant women should try to avoid becoming constipated, because they are more likely to have hemorrhoids during pregnancy.  In the last trimester of pregnancy, the enlarged uterus may press on blood vessels and causes hemorrhoids.  In addition, the strain of childbirth sometimes causes hemorrhoids after the birth.  Symptoms of Hemorrhoids  Some symptoms of hemorrhoids include:  Swelling and/or a tender lump around the anus  Itching, mild burning and bleeding around the anus  Painful bowel movements with or without constipation  Bright red blood covering the stool, on toilet paper or in the toilet bowel.   Symptoms usually go away within a few days.  Always talk to your doctor about any bleeding to make sure it is not from some other causes.  Diagnosing and Treating Hemorrhoids  Diagnosis is made by an examination by your  healthcare provider.  Special test can be performed by your doctor.    Most cases of hemorrhoids can be treated with:  High-fiber diet: Eat more high-fiber foods,  which help prevent constipation.  Ask for more detailed fiber information on types and sources of fiber from your healthcare provider.  Fluids: Drink plenty of water.  This helps soften bowel movements so they are easier to pass.  Sitz baths and cold packs: Sitting in lukewarm water two or three times a day for 15 minutes cleases the anal area and may relieve discomfort.  If the water is too hot, swelling around the anus will get worse.  Placing a cloth-covered ice pack on the anus for ten minutes four times a day can also help reduce selling.  Gently pushing a prolapsed hemorrhoid back inside after the bath or ice pack can be helpful.  Medications: For mild discomfort, your healthcare provider may suggest over-the-counter pain medication or prescribe a cream or ointment for topical use.  The cream may contain witch hazel, zinc oxide or petroleum jelly.  Medicated suppositories are also a treatment option.  Always consult your doctor before applying medications or creams.  Procedures and surgeries: There are also a number of procedures and surgeries to shrink or remove hemorrhoids in more serious cases.  Talk to your physician about these options.  You can often prevent hemorrhoids or keep them from becoming worse by maintaining a healthy lifestyle.  Eat a fiber-rich diet of fruits, vegetables and whole grains.  Also, drink plenty of water and exercise regularly.   2007, [employer] Doc.30   If you have lab work done today you will be contacted with your lab results within the next 2 weeks.  If you have not heard from [place] then please contact us. The fastest way to get your results is to register for My Chart.   IF you received an x-ray today, you will receive an invoice from [facility]. Please contact [facility] at [phone] with questions or concerns regarding your invoice.   IF you received labwork today, you will receive an invoice from [facility]. Please contact [facility] at [phone] with questions or concerns regarding your invoice.   Our billing staff will not be able to assist you with questions regarding bills from these companies.  You will be contacted with the lab results as soon as they are available. The fastest way to get your results is to activate your My Chart account. Instructions are located on the last page of this paperwork. If you have not heard from [place] regarding the results in 2 weeks, please contact this office.         Signed, [name], MD [facility]

## 2020-07-19 NOTE — Addendum Note (Signed)
Addended by: Meredeth Ide on: 07/19/2020 02:19 PM   Modules accepted: Orders

## 2020-07-20 LAB — COMPREHENSIVE METABOLIC PANEL
ALT: 30 IU/L (ref 0–44)
AST: 30 IU/L (ref 0–40)
Albumin/Globulin Ratio: 1.9 (ref 1.2–2.2)
Albumin: 4.9 g/dL (ref 3.8–4.9)
Alkaline Phosphatase: 34 IU/L — ABNORMAL LOW (ref 44–121)
BUN/Creatinine Ratio: 10 (ref 9–20)
BUN: 11 mg/dL (ref 6–24)
Bilirubin Total: 1 mg/dL (ref 0.0–1.2)
CO2: 22 mmol/L (ref 20–29)
Calcium: 9.4 mg/dL (ref 8.7–10.2)
Chloride: 105 mmol/L (ref 96–106)
Creatinine, Ser: 1.06 mg/dL (ref 0.76–1.27)
GFR calc Af Amer: 90 mL/min/{1.73_m2} (ref >59)
GFR calc non Af Amer: 78 mL/min/{1.73_m2} (ref >59)
Globulin, Total: 2.6 g/dL (ref 1.5–4.5)
Glucose: 92 mg/dL (ref 65–99)
Potassium: 4.9 mmol/L (ref 3.5–5.2)
Sodium: 143 mmol/L (ref 134–144)
Total Protein: 7.5 g/dL (ref 6.0–8.5)

## 2020-07-20 LAB — LIPID PANEL
Chol/HDL Ratio: 2.8 ratio (ref 0.0–5.0)
Cholesterol, Total: 160 mg/dL (ref 100–199)
HDL: 58 mg/dL (ref >39)
LDL Chol Calc (NIH): 89 mg/dL (ref 0–99)
Triglycerides: 66 mg/dL (ref 0–149)
VLDL Cholesterol Cal: 13 mg/dL (ref 5–40)

## 2020-07-20 LAB — HEMOGLOBIN A1C
Est. average glucose Bld gHb Est-mCnc: 120 mg/dL
Hgb A1c MFr Bld: 5.8 % — ABNORMAL HIGH (ref 4.8–5.6)

## 2020-10-11 ENCOUNTER — Ambulatory Visit: Payer: 59 | Admitting: Sports Medicine

## 2020-10-18 ENCOUNTER — Other Ambulatory Visit: Payer: Self-pay

## 2020-10-18 ENCOUNTER — Ambulatory Visit: Payer: 59 | Admitting: Sports Medicine

## 2020-10-18 ENCOUNTER — Encounter: Payer: Self-pay | Admitting: Sports Medicine

## 2020-10-18 DIAGNOSIS — B351 Tinea unguium: Secondary | ICD-10-CM | POA: Diagnosis not present

## 2020-10-18 DIAGNOSIS — M79674 Pain in right toe(s): Secondary | ICD-10-CM

## 2020-10-18 NOTE — Progress Notes (Signed)
Subjective: DORYAN BAHL is a 60 y.o. male patient seen today in office for follow-up evaluation of after completing laser treatment. Reports that he feels that treatment went well and has no issues with his toes. Denies constitutional symptoms.   Patient Active Problem List   Diagnosis Date Noted  . Internal hemorrhoids with grade 2 prolapse 11/28/2014  . HTN (hypertension) 04/19/2013  . GERD (gastroesophageal reflux disease) 04/19/2013    Current Outpatient Medications on File Prior to Visit  Medication Sig Dispense Refill  . albuterol (VENTOLIN HFA) 108 (90 Base) MCG/ACT inhaler Inhale 2 puffs into the lungs every 4 (four) hours as needed for wheezing. 18 g 1  . betamethasone dipropionate 0.05 % cream Apply topically daily. 30 g 0  . hydrochlorothiazide (MICROZIDE) 12.5 MG capsule TAKE 1 CAPSULE EVERY MORNING. 90 capsule 3  . omeprazole (PRILOSEC) 40 MG capsule TAKE 1 CAPSULE (40 MG TOTAL) BY MOUTH DAILY.as needed 90 capsule 1   No current facility-administered medications on file prior to visit.    Allergies  Allergen Reactions  . Lamisil [Terbinafine]     Sharp stomach pain  . Shellfish Allergy     Objective: Physical Exam  General: Well developed, nourished, no acute distress, awake, alert and oriented x 3  Vascular: Dorsalis pedis artery 2/4 bilateral, Posterior tibial artery 2/4 bilateral, skin temperature warm to warm proximal to distal bilateral lower extremities, no varicosities, pedal hair present bilateral.  Neurological: Gross sensation present via light touch bilateral.   Dermatological: Skin is warm, dry, and supple bilateral, Nails at right 1-5th toes are normal, no thickness and no discoloration, no subungal debris, no webspace macerations present bilateral, no open lesions present bilateral, no callus/corns/hyperkeratotic tissue present bilateral. No signs of infection bilateral.  Musculoskeletal: + Bunion and hammertoe boney deformities noted bilateral.  Muscular strength within normal limits without painon range of motion. No pain with calf compression bilateral.   Assessment and Plan:  Problem List Items Addressed This Visit   None   Visit Diagnoses    Dermatophytosis of nail    -  Primary   Toe pain, right         -Examined patient -Discussed continue care for nails -Advised good hygiene habits and monitoring -Advised Lamisil spray for preventative measures periodically  -Recommend good supportive shoes that do not crowd toes -Patient to return as needed or sooner if symptoms worsen.  Landis Martins, DPM

## 2020-11-05 ENCOUNTER — Other Ambulatory Visit: Payer: Self-pay

## 2020-11-05 ENCOUNTER — Ambulatory Visit: Payer: 59 | Admitting: Family Medicine

## 2020-11-05 ENCOUNTER — Encounter: Payer: Self-pay | Admitting: Family Medicine

## 2020-11-05 VITALS — BP 120/66 | HR 77 | Temp 98.3°F | Resp 16 | Ht 64.0 in | Wt 162.4 lb

## 2020-11-05 DIAGNOSIS — K625 Hemorrhage of anus and rectum: Secondary | ICD-10-CM | POA: Diagnosis not present

## 2020-11-05 DIAGNOSIS — K648 Other hemorrhoids: Secondary | ICD-10-CM | POA: Diagnosis not present

## 2020-11-05 NOTE — Patient Instructions (Signed)
I will refer you back to Dr. Carlean Purl. I suspect internal hemorrhoids may be the cause. Return to the clinic or go to the nearest emergency room if any of your symptoms worsen or new symptoms occur.    Hemorrhoids Hemorrhoids are swollen veins in and around the rectum or anus. There are two types of hemorrhoids:  Internal hemorrhoids. These occur in the veins that are just inside the rectum. They may poke through to the outside and become irritated and painful.  External hemorrhoids. These occur in the veins that are outside the anus and can be felt as a painful swelling or hard lump near the anus. Most hemorrhoids do not cause serious problems, and they can be managed with home treatments such as diet and lifestyle changes. If home treatments do not help the symptoms, procedures can be done to shrink or remove the hemorrhoids. What are the causes? This condition is caused by increased pressure in the anal area. This pressure may result from various things, including:  Constipation.  Straining to have a bowel movement.  Diarrhea.  Pregnancy.  Obesity.  Sitting for long periods of time.  Heavy lifting or other activity that causes you to strain.  Anal sex.  Riding a bike for a long period of time. What are the signs or symptoms? Symptoms of this condition include:  Pain.  Anal itching or irritation.  Rectal bleeding.  Leakage of stool (feces).  Anal swelling.  One or more lumps around the anus. How is this diagnosed? This condition can often be diagnosed through a visual exam. Other exams or tests may also be done, such as:  An exam that involves feeling the rectal area with a gloved hand (digital rectal exam).  An exam of the anal canal that is done using a small tube (anoscope).  A blood test, if you have lost a significant amount of blood.  A test to look inside the colon using a flexible tube with a camera on the end (sigmoidoscopy or colonoscopy). How is this  treated? This condition can usually be treated at home. However, various procedures may be done if dietary changes, lifestyle changes, and other home treatments do not help your symptoms. These procedures can help make the hemorrhoids smaller or remove them completely. Some of these procedures involve surgery, and others do not. Common procedures include:  Rubber band ligation. Rubber bands are placed at the base of the hemorrhoids to cut off their blood supply.  Sclerotherapy. Medicine is injected into the hemorrhoids to shrink them.  Infrared coagulation. A type of light energy is used to get rid of the hemorrhoids.  Hemorrhoidectomy surgery. The hemorrhoids are surgically removed, and the veins that supply them are tied off.  Stapled hemorrhoidopexy surgery. The surgeon staples the base of the hemorrhoid to the rectal wall. Follow these instructions at home: Eating and drinking  Eat foods that have a lot of fiber in them, such as whole grains, beans, nuts, fruits, and vegetables.  Ask your health care provider about taking products that have added fiber (fiber supplements).  Reduce the amount of fat in your diet. You can do this by eating low-fat dairy products, eating less red meat, and avoiding processed foods.  Drink enough fluid to keep your urine pale yellow.   Managing pain and swelling  Take warm sitz baths for 20 minutes, 3-4 times a day to ease pain and discomfort. You may do this in a bathtub or using a portable sitz bath that fits  over the toilet.  If directed, apply ice to the affected area. Using ice packs between sitz baths may be helpful. ? Put ice in a plastic bag. ? Place a towel between your skin and the bag. ? Leave the ice on for 20 minutes, 2-3 times a day.   General instructions  Take over-the-counter and prescription medicines only as told by your health care provider.  Use medicated creams or suppositories as told.  Get regular exercise. Ask your health  care provider how much and what kind of exercise is best for you. In general, you should do moderate exercise for at least 30 minutes on most days of the week (150 minutes each week). This can include activities such as walking, biking, or yoga.  Go to the bathroom when you have the urge to have a bowel movement. Do not wait.  Avoid straining to have bowel movements.  Keep the anal area dry and clean. Use wet toilet paper or moist towelettes after a bowel movement.  Do not sit on the toilet for long periods of time. This increases blood pooling and pain.  Keep all follow-up visits as told by your health care provider. This is important. Contact a health care provider if you have:  Increasing pain and swelling that are not controlled by treatment or medicine.  Difficulty having a bowel movement, or you are unable to have a bowel movement.  Pain or inflammation outside the area of the hemorrhoids. Get help right away if you have:  Uncontrolled bleeding from your rectum. Summary  Hemorrhoids are swollen veins in and around the rectum or anus.  Most hemorrhoids can be managed with home treatments such as diet and lifestyle changes.  Taking warm sitz baths can help ease pain and discomfort.  In severe cases, procedures or surgery can be done to shrink or remove the hemorrhoids. This information is not intended to replace advice given to you by your health care provider. Make sure you discuss any questions you have with your health care provider. Document Revised: 10/15/2018 Document Reviewed: 10/08/2017 Elsevier Patient Education  [place].

## 2020-11-05 NOTE — Progress Notes (Signed)
Subjective:  Patient ID: Justin Farley, male    DOB: June 23, 1960  Age: 60 y.o. MRN: [id number]  CC:  Chief Complaint  Patient presents with  . Blood In Stools    Pt had blood when wiping after BM yesterday, pt does note he has a hxt of hemorrhoids unsure if this is the cause    HPI Justin Farley presents for    Bright red blood per rectum: Noticed bright red blood per rectum yesterday after wiping in the morning, and again with wiping last night. No blood today. No abdominal pain, weight loss, fever, or night sweats.  Regular workouts, some heavy lifting. No recent hard stool or straining.  No anal/rectal pain.   One episode since our visit in February.   History of internal hemorrhoids, treated with banding by Dr. Carlean Purl in 2016.  Bright red blood per rectum also discussed in February, thought to be possible hemorrhoidal at that time.      Colonoscopy in December 2014 with Dr. Ardis Hughs, normal colon, no polyps at that time.  10-year follow-up planned  History Patient Active Problem List   Diagnosis Date Noted  . Internal hemorrhoids with grade 2 prolapse 11/28/2014  . HTN (hypertension) 04/19/2013  . GERD (gastroesophageal reflux disease) 04/19/2013   Past Medical History:  Diagnosis Date  . Allergy   . Esophagitis   . Gastritis   . GERD (gastroesophageal reflux disease)   . Hypertension   . Internal hemorrhoids with grade 2 prolapse 11/28/2014   Past Surgical History:  Procedure Laterality Date  . COLONOSCOPY    . ESOPHAGOGASTRODUODENOSCOPY    . HEMORRHOID BANDING    . WISDOM TOOTH EXTRACTION  2009   Allergies  Allergen Reactions  . Lamisil [Terbinafine]     Sharp stomach pain  . Shellfish Allergy    Prior to Admission medications   Medication Sig Start Date End Date Taking? Authorizing Provider  albuterol (VENTOLIN HFA) 108 (90 Base) MCG/ACT inhaler Inhale 2 puffs into the lungs every 4 (four) hours as needed for wheezing. 10/03/19  Yes Wendie Agreste, MD  betamethasone dipropionate 0.05 % cream Apply topically daily. 02/29/20  Yes Maximiano Coss, NP  hydrochlorothiazide (MICROZIDE) 12.5 MG capsule TAKE 1 CAPSULE EVERY MORNING. 07/19/20  Yes Wendie Agreste, MD  omeprazole (PRILOSEC) 40 MG capsule TAKE 1 CAPSULE (40 MG TOTAL) BY MOUTH DAILY.as needed 07/19/20  Yes Wendie Agreste, MD   Social History   Socioeconomic History  . Marital status: Single    Spouse name: Not on file  . Number of children: Not on file  . Years of education: Not on file  . Highest education level: Not on file  Occupational History  . Occupation: Freight forwarder  Tobacco Use  . Smoking status: Never Smoker  . Smokeless tobacco: Never Used  Substance and Sexual Activity  . Alcohol use: No  . Drug use: No  . Sexual activity: Yes    Birth control/protection: Condom  Other Topics Concern  . Not on file  Social History Narrative   Single. Education: [employer]. Exercise: 3 days a week for 2 hours.   Social Determinants of Health   Financial Resource Strain: Not on file  Food Insecurity: Not on file  Transportation Needs: Not on file  Physical Activity: Not on file  Stress: Not on file  Social Connections: Not on file  Intimate Partner Violence: Not on file    Review of Systems Per HPI.   Objective:  Vitals:   11/05/20 1303  BP: 120/66  Pulse: 77  Resp: 16  Temp: 98.3 F (36.8 C)  TempSrc: Temporal  SpO2: 97%  Weight: 162 lb 6.4 oz (73.7 kg)  Height: 5\' 4"  (1.626 m)     Physical Exam Constitutional:      General: He is not in acute distress.    Appearance: He is well-developed.  HENT:     Head: Normocephalic and atraumatic.  Cardiovascular:     Rate and Rhythm: Normal rate.  Pulmonary:     Effort: Pulmonary effort is normal.  Genitourinary:    Rectum: External hemorrhoid (11:00, nonthrombosed, no ttp, no fissure or active bleeding. ) present. No mass, tenderness or anal fissure.  Neurological:     Mental  Status: He is alert and oriented to person, place, and time.      Assessment & Plan:  Justin Farley is a 60 y.o. male . BRBPR (bright red blood per rectum) - Plan: Ambulatory referral to Gastroenterology  Internal hemorrhoids - Plan: Ambulatory referral to Gastroenterology Suspected recurrence of internal hemorrhoids as painless bright red blood per rectum, now improved today.  One external hemorrhoid seen but nonthrombosed, no bleeding, no fissures.  Will refer back to gastroenterology for repeat exam and to determine if repeat banding indicated.  RTC/urgent care precautions given.  No orders of the defined types were placed in this encounter.  Patient Instructions   I will refer you back to Dr. Carlean Purl. I suspect internal hemorrhoids may be the cause. Return to the clinic or go to the nearest emergency room if any of your symptoms worsen or new symptoms occur.    Hemorrhoids Hemorrhoids are swollen veins in and around the rectum or anus. There are two types of hemorrhoids:  Internal hemorrhoids. These occur in the veins that are just inside the rectum. They may poke through to the outside and become irritated and painful.  External hemorrhoids. These occur in the veins that are outside the anus and can be felt as a painful swelling or hard lump near the anus. Most hemorrhoids do not cause serious problems, and they can be managed with home treatments such as diet and lifestyle changes. If home treatments do not help the symptoms, procedures can be done to shrink or remove the hemorrhoids. What are the causes? This condition is caused by increased pressure in the anal area. This pressure may result from various things, including:  Constipation.  Straining to have a bowel movement.  Diarrhea.  Pregnancy.  Obesity.  Sitting for long periods of time.  Heavy lifting or other activity that causes you to strain.  Anal sex.  Riding a bike for a long period of time. What  are the signs or symptoms? Symptoms of this condition include:  Pain.  Anal itching or irritation.  Rectal bleeding.  Leakage of stool (feces).  Anal swelling.  One or more lumps around the anus. How is this diagnosed? This condition can often be diagnosed through a visual exam. Other exams or tests may also be done, such as:  An exam that involves feeling the rectal area with a gloved hand (digital rectal exam).  An exam of the anal canal that is done using a small tube (anoscope).  A blood test, if you have lost a significant amount of blood.  A test to look inside the colon using a flexible tube with a camera on the end (sigmoidoscopy or colonoscopy). How is this treated? This condition can usually be  treated at home. However, various procedures may be done if dietary changes, lifestyle changes, and other home treatments do not help your symptoms. These procedures can help make the hemorrhoids smaller or remove them completely. Some of these procedures involve surgery, and others do not. Common procedures include:  Rubber band ligation. Rubber bands are placed at the base of the hemorrhoids to cut off their blood supply.  Sclerotherapy. Medicine is injected into the hemorrhoids to shrink them.  Infrared coagulation. A type of light energy is used to get rid of the hemorrhoids.  Hemorrhoidectomy surgery. The hemorrhoids are surgically removed, and the veins that supply them are tied off.  Stapled hemorrhoidopexy surgery. The surgeon staples the base of the hemorrhoid to the rectal wall. Follow these instructions at home: Eating and drinking  Eat foods that have a lot of fiber in them, such as whole grains, beans, nuts, fruits, and vegetables.  Ask your health care provider about taking products that have added fiber (fiber supplements).  Reduce the amount of fat in your diet. You can do this by eating low-fat dairy products, eating less red meat, and avoiding processed  foods.  Drink enough fluid to keep your urine pale yellow.   Managing pain and swelling  Take warm sitz baths for 20 minutes, 3-4 times a day to ease pain and discomfort. You may do this in a bathtub or using a portable sitz bath that fits over the toilet.  If directed, apply ice to the affected area. Using ice packs between sitz baths may be helpful. ? Put ice in a plastic bag. ? Place a towel between your skin and the bag. ? Leave the ice on for 20 minutes, 2-3 times a day.   General instructions  Take over-the-counter and prescription medicines only as told by your health care provider.  Use medicated creams or suppositories as told.  Get regular exercise. Ask your health care provider how much and what kind of exercise is best for you. In general, you should do moderate exercise for at least 30 minutes on most days of the week (150 minutes each week). This can include activities such as walking, biking, or yoga.  Go to the bathroom when you have the urge to have a bowel movement. Do not wait.  Avoid straining to have bowel movements.  Keep the anal area dry and clean. Use wet toilet paper or moist towelettes after a bowel movement.  Do not sit on the toilet for long periods of time. This increases blood pooling and pain.  Keep all follow-up visits as told by your health care provider. This is important. Contact a health care provider if you have:  Increasing pain and swelling that are not controlled by treatment or medicine.  Difficulty having a bowel movement, or you are unable to have a bowel movement.  Pain or inflammation outside the area of the hemorrhoids. Get help right away if you have:  Uncontrolled bleeding from your rectum. Summary  Hemorrhoids are swollen veins in and around the rectum or anus.  Most hemorrhoids can be managed with home treatments such as diet and lifestyle changes.  Taking warm sitz baths can help ease pain and discomfort.  In severe  cases, procedures or surgery can be done to shrink or remove the hemorrhoids. This information is not intended to replace advice given to you by your health care provider. Make sure you discuss any questions you have with your health care provider. Document Revised: 10/15/2018 Document Reviewed:  10/08/2017 Elsevier Patient Education  2021 Mylo, [name], MD [facility]

## 2020-11-09 ENCOUNTER — Telehealth: Payer: 59 | Admitting: Family Medicine

## 2020-11-09 DIAGNOSIS — U071 COVID-19: Secondary | ICD-10-CM | POA: Diagnosis not present

## 2020-11-09 MED ORDER — NIRMATRELVIR/RITONAVIR (PAXLOVID)TABLET: 3.0000 | ORAL_TABLET | Freq: Two times a day (BID) | ORAL | 0 refills | Status: AC

## 2020-11-09 NOTE — Progress Notes (Signed)
Virtual Visit via Video Note  I connected with Justin Farley on 11/09/20 at 8:12 AM by a video enabled telemedicine application and verified that I am speaking with the correct person using two identifiers.  Initial visit via video, did switch to audio by phone for improved connection. Patient location: home My location: office - [place].    I discussed the limitations, risks, security and privacy concerns of performing an evaluation and management service by telephone and the availability of in person appointments. I also discussed with the patient that there may be a patient responsible charge related to this service. The patient expressed understanding and agreed to proceed, consent obtained  Chief complaint:  Chief Complaint  Patient presents with   Covid Positive    Pt reports positive COVID yesterday -pharmacy test, cough, congestion, fever of 101.0, pt has had all 4 COVID vaccine doses. Started about 4 days ago.      History of Present Illness: Justin Farley is a 60 y.o. male  Covid 49 infection: Initial symptoms: 3 nights ago. Negative home covid test. Slight cough, temp 99. Temp 100.3 next day. Home test positive on 6/8, repeat test at [facility] positive as well. Temp 101.  Has continued cough, slight congestion, body aches.  Eating and drinking ok. No dyspnea. No confusion, staying hydrated.   Treatments: tylenol. Nyquil - helped with sleep.   COVID risk of complications score 2.  He has received COVID-19 immunization with initial series March and April 2021, booster in October 2021, second booster April 9.  eGFR 90 on 07/19/20  Patient Active Problem List   Diagnosis Date Noted   Internal hemorrhoids with grade 2 prolapse 11/28/2014   HTN (hypertension) 04/19/2013   GERD (gastroesophageal reflux disease) 04/19/2013   Past Medical History:  Diagnosis Date   Allergy    Esophagitis    Gastritis    GERD (gastroesophageal reflux disease)     Hypertension    Internal hemorrhoids with grade 2 prolapse 11/28/2014   Past Surgical History:  Procedure Laterality Date   COLONOSCOPY     ESOPHAGOGASTRODUODENOSCOPY     HEMORRHOID BANDING     WISDOM TOOTH EXTRACTION  2009   Allergies  Allergen Reactions   Lamisil [Terbinafine]     Sharp stomach pain   Shellfish Allergy    Prior to Admission medications   Medication Sig Start Date End Date Taking? Authorizing Provider  albuterol (VENTOLIN HFA) 108 (90 Base) MCG/ACT inhaler Inhale 2 puffs into the lungs every 4 (four) hours as needed for wheezing. 10/03/19  Yes Wendie Agreste, MD  betamethasone dipropionate 0.05 % cream Apply topically daily. 02/29/20  Yes Maximiano Coss, NP  hydrochlorothiazide (MICROZIDE) 12.5 MG capsule TAKE 1 CAPSULE EVERY MORNING. 07/19/20  Yes Wendie Agreste, MD  omeprazole (PRILOSEC) 40 MG capsule TAKE 1 CAPSULE (40 MG TOTAL) BY MOUTH DAILY.as needed 07/19/20  Yes Wendie Agreste, MD   Social History   Socioeconomic History   Marital status: Single    Spouse name: Not on file   Number of children: Not on file   Years of education: Not on file   Highest education level: Not on file  Occupational History   Occupation: Freight forwarder  Tobacco Use   Smoking status: Never   Smokeless tobacco: Never  Substance and Sexual Activity   Alcohol use: No   Drug use: No   Sexual activity: Yes    Birth control/protection: Condom  Other Topics Concern   Not on  file  Social History Narrative   Single. Education: [employer]. Exercise: 3 days a week for 2 hours.   Social Determinants of Health   Financial Resource Strain: Not on file  Food Insecurity: Not on file  Transportation Needs: Not on file  Physical Activity: Not on file  Stress: Not on file  Social Connections: Not on file  Intimate Partner Violence: Not on file    Observations/Objective: Nontoxic appearance on video, speaking in full sentences, no respiratory distress.  Euthymic mood,  appropriate responses.  No cough during exam.  All questions were answered with understanding of plan expressed. There were no vitals filed for this visit.' Assessment and Plan: COVID-19 virus infection - Plan: nirmatrelvir/ritonavir EUA (PAXLOVID) TABS COVID-19 infection with overall mild symptoms at this time.  No respiratory distress, tolerating fluids.  Reassuring that he has been vaccinated and had 2 boosters.  Discussed antivirals, including possible side effects. Will start Paxlovid.  Symptomatic care with Mucinex or NyQuil if needed for cough, Tylenol for fever if needed, fluids, rest.  Isolation then masking precautions with return to work timing discussed.  ER precautions given.  Follow Up Instructions: ER/urgent care precautions given.   I discussed the assessment and treatment plan with the patient. The patient was provided an opportunity to ask questions and all were answered. The patient agreed with the plan and demonstrated an understanding of the instructions.   The patient was advised to call back or seek an in-person evaluation if the symptoms worsen or if the condition fails to improve as anticipated.  I provided 12 minutes of non-face-to-face time during this encounter.   Wendie Agreste, MD

## 2020-11-12 ENCOUNTER — Encounter: Payer: Self-pay | Admitting: Physician Assistant

## 2020-12-12 ENCOUNTER — Ambulatory Visit: Payer: 59 | Admitting: Physician Assistant

## 2020-12-12 ENCOUNTER — Encounter: Payer: Self-pay | Admitting: Physician Assistant

## 2020-12-12 ENCOUNTER — Other Ambulatory Visit: Payer: Self-pay

## 2020-12-12 VITALS — BP 124/90 | HR 72 | Ht 64.0 in | Wt 162.0 lb

## 2020-12-12 DIAGNOSIS — K219 Gastro-esophageal reflux disease without esophagitis: Secondary | ICD-10-CM

## 2020-12-12 DIAGNOSIS — K625 Hemorrhage of anus and rectum: Secondary | ICD-10-CM | POA: Diagnosis not present

## 2020-12-12 MED ORDER — NA SULFATE-K SULFATE-MG SULF 17.5-3.13-1.6 GM/177ML PO SOLN: 1.0000 | Freq: Once | ORAL | 0 refills | Status: AC

## 2020-12-12 NOTE — Progress Notes (Signed)
Chief Complaint: Rectal bleeding and epigastric discomfort  HPI:    Mr. Justin Farley is a 60 year old African-American male, known to Dr. Ardis Hughs, with a past medical history as listed below including GERD, gastritis and others, who was referred to me by Wendie Agreste, MD for a complaint of rectal bleeding and epigastric discomfort.      05/10/2013 colonoscopy with Dr. Ardis Hughs completely normal, repeat recommended in 10 years.    11/28/2014 and 12/25/2014 patient underwent internal hemorrhoid banding with Dr. Carlean Purl.    Today, the patient tells me that he used to have a problem with hemorrhoids and has been fine until about a year ago and ever since then once every 3 to 4 months he will see some bright red blood on the toilet paper when wiping which may last for a day or day and a half at the time and then go away.  He does do a lot of weightlifting but tells me his bowel movements are regular.  He discussed this with his PCP and tells me that it did worry him.    Also discusses some epigastric discomfort which he feels occasionally throughout the week, sometimes even if he just takes a deep breath.  He is on Omeprazole 40 mg daily for reflux which seems to control those symptoms    Works as a Freight forwarder at [employer].    Denies fever, chills, weight loss, change in bowel habits or family history of colon cancer.  Past Medical History:  Diagnosis Date   Allergy    Colon polyps    Esophagitis    Gastritis    GERD (gastroesophageal reflux disease)    Hypertension    Internal hemorrhoids with grade 2 prolapse 11/28/2014    Past Surgical History:  Procedure Laterality Date   COLONOSCOPY     ESOPHAGOGASTRODUODENOSCOPY     HEMORRHOID BANDING     WISDOM TOOTH EXTRACTION  2009    Current Outpatient Medications  Medication Sig Dispense Refill   betamethasone dipropionate 0.05 % cream Apply topically daily. 30 g 0   hydrochlorothiazide (MICROZIDE) 12.5 MG capsule TAKE 1 CAPSULE EVERY MORNING. 90  capsule 3   omeprazole (PRILOSEC) 40 MG capsule TAKE 1 CAPSULE (40 MG TOTAL) BY MOUTH DAILY.as needed 90 capsule 1   albuterol (VENTOLIN HFA) 108 (90 Base) MCG/ACT inhaler Inhale 2 puffs into the lungs every 4 (four) hours as needed for wheezing. (Patient not taking: Reported on 12/12/2020) 18 g 1   No current facility-administered medications for this visit.    Allergies as of 12/12/2020 - Review Complete 12/12/2020  Allergen Reaction Noted   Lamisil [terbinafine]  12/20/2019   Shellfish allergy  03/16/2017    Family History  Problem Relation Age of Onset   Hypertension Mother    Hypertension Sister    Diabetes Brother    Prostate cancer Maternal Uncle    Colon cancer Neg Hx     Social History   Socioeconomic History   Marital status: Single    Spouse name: Not on file   Number of children: 4   Years of education: Not on file   Highest education level: Not on file  Occupational History   Occupation: Freight forwarder   Occupation: Librarian, academic    Comment: [employer]  Tobacco Use   Smoking status: Never   Smokeless tobacco: Never  Vaping Use   Vaping Use: Never used  Substance and Sexual Activity   Alcohol use: Yes    Comment: special occasions  Drug use: No   Sexual activity: Yes    Birth control/protection: Condom  Other Topics Concern   Not on file  Social History Narrative   Single. Education: [employer]. Exercise: 3 days a week for 2 hours.   Social Determinants of Health   Financial Resource Strain: Not on file  Food Insecurity: Not on file  Transportation Needs: Not on file  Physical Activity: Not on file  Stress: Not on file  Social Connections: Not on file  Intimate Partner Violence: Not on file    Review of Systems:    Constitutional: No weight loss, fever or chills Skin: No rash  Cardiovascular: No chest pain  Respiratory: No SOB  Gastrointestinal: See HPI and otherwise negative Genitourinary: No dysuria  Neurological: No headache,  dizziness or syncope Musculoskeletal: No new muscle or joint pain Hematologic: No bruising Psychiatric: No history of depression or anxiety    Physical Exam:  Vital signs: BP 124/90 (BP Location: Left Arm, Patient Position: Sitting, Cuff Size: Normal)   Pulse 72   Ht 5\' 4"  (1.626 m) Comment: height measured without shoes  Wt 162 lb (73.5 kg)   BMI 27.81 kg/m   Constitutional:   Pleasant fit appearing AA male appears to be in NAD, Well developed, Well nourished, alert and cooperative Head:  Normocephalic and atraumatic. Eyes:   PEERL, EOMI. No icterus. Conjunctiva pink. Ears:  Normal auditory acuity. Neck:  Supple Throat: Oral cavity and pharynx without inflammation, swelling or lesion.  Respiratory: Respirations even and unlabored. Lungs clear to auscultation bilaterally.   No wheezes, crackles, or rhonchi.  Cardiovascular: Normal S1, S2. No MRG. Regular rate and rhythm. No peripheral edema, cyanosis or pallor.  Gastrointestinal:  Soft, nondistended, nontender. No rebound or guarding. Normal bowel sounds. No appreciable masses or hepatomegaly. Rectal:  Declined Msk:  Symmetrical without gross deformities. Without edema, no deformity or joint abnormality.  Neurologic:  Alert and  oriented x4;  grossly normal neurologically.  Skin:   Dry and intact without significant lesions or rashes. Psychiatric: Demonstrates good judgement and reason without abnormal affect or behaviors.  RELEVANT LABS AND IMAGING: CBC    Component Value Date/Time   WBC 4.1 (A) 12/08/2019 1301   WBC 4.0 01/30/2015 0854   RBC 4.93 12/08/2019 1301   RBC 4.96 01/30/2015 0854   HGB 14.4 12/08/2019 1301   HGB 15.1 04/04/2019 1020   HCT 44.1 (A) 12/08/2019 1301   HCT 45.7 04/04/2019 1020   PLT 185 04/04/2019 1020   MCV 89.6 12/08/2019 1301   MCV 88 04/04/2019 1020   MCH 29.3 12/08/2019 1301   MCH 29.4 01/30/2015 0854   MCHC 32.7 12/08/2019 1301   MCHC 33.5 01/30/2015 0854   RDW 13.1 04/04/2019 1020     CMP     Component Value Date/Time   NA 143 07/19/2020 1453   K 4.9 07/19/2020 1453   CL 105 07/19/2020 1453   CO2 22 07/19/2020 1453   GLUCOSE 92 07/19/2020 1453   GLUCOSE 85 01/31/2016 1546   BUN 11 07/19/2020 1453   CREATININE 1.06 07/19/2020 1453   CREATININE 0.96 01/31/2016 1546   CALCIUM 9.4 07/19/2020 1453   PROT 7.5 07/19/2020 1453   ALBUMIN 4.9 07/19/2020 1453   AST 30 07/19/2020 1453   ALT 30 07/19/2020 1453   ALKPHOS 34 (L) 07/19/2020 1453   BILITOT 1.0 07/19/2020 1453   GFRNONAA 78 07/19/2020 1453   GFRNONAA >89 01/31/2016 1546   GFRAA 90 07/19/2020 1453   GFRAA >89  01/31/2016 1546    Assessment: 1.  Rectal bleeding: Occurs once every 3 to 4 months, small amount of bright red blood on the toilet paper, history of hemorrhoids which were banded last in 2016, last colonoscopy in 2014 normal; most likely hemorrhoids 2.  Epigastric discomfort: Occasionally throughout the day, history of GERD; consider gastritis versus other  Plan: 1.  Due to rectal bleeding and the fact that it has been 8 years since his last colonoscopy, we will proceed with one now.  Scheduled this diagnostic colonoscopy with Dr. Ardis Hughs in the [facility].  Did provide the patient with a detailed list of risks for the procedure and he agrees to proceed. 2.  After time of procedure if Dr. Ardis Hughs believes he would benefit from repeat banding this Farley be set up with Dr. Carlean Purl. 3.  Encouraged the patient to increase his Omeprazole to 40 mg twice daily, 30-60 minutes before breakfast and then again before dinner for the next 1 to 2 weeks to see if this helps with his epigastric discomfort.  If not he should call and let us know. 4.  Patient to follow in clinic per recommendations from Dr. Ardis Hughs after time of procedure.  Ellouise Newer, PA-C [facility] 12/12/2020, 8:46 AM  Cc: Wendie Agreste, MD

## 2020-12-12 NOTE — Patient Instructions (Signed)
Increase Omeprazole 40 mg to twice daily 30-60 minutes before breakfast and dinner, for 1-2 weeks to see if it helps with your stomach pain.   You have been scheduled for a colonoscopy. Please follow written instructions given to you at your visit today.  Please pick up your prep supplies at the pharmacy within the next 1-3 days. If you use inhalers (even only as needed), please bring them with you on the day of your procedure.  If you are age 58 or older, your body mass index should be between 23-30. Your Body mass index is 27.81 kg/m. If this is out of the aforementioned range listed, please consider follow up with your Primary Care Provider.  If you are age 28 or younger, your body mass index should be between 19-25. Your Body mass index is 27.81 kg/m. If this is out of the aformentioned range listed, please consider follow up with your Primary Care Provider.   __________________________________________________________  The [facility] providers would like to encourage you to use [facility] to communicate with providers for non-urgent requests or questions.  Due to long hold times on the telephone, sending your provider a message by [facility] may be a faster and more efficient way to get a response.  Please allow 48 business hours for a response.  Please remember that this is for non-urgent requests.

## 2020-12-12 NOTE — Progress Notes (Signed)
I agree with the above note, plan 

## 2020-12-31 ENCOUNTER — Encounter: Payer: Self-pay | Admitting: Gastroenterology

## 2021-01-07 ENCOUNTER — Other Ambulatory Visit: Payer: Self-pay

## 2021-01-07 ENCOUNTER — Encounter: Payer: Self-pay | Admitting: Gastroenterology

## 2021-01-07 ENCOUNTER — Ambulatory Visit (AMBULATORY_SURGERY_CENTER): Payer: 59 | Admitting: Gastroenterology

## 2021-01-07 VITALS — BP 101/66 | HR 71 | Temp 98.0°F | Resp 14 | Ht 64.0 in | Wt 162.0 lb

## 2021-01-07 DIAGNOSIS — K625 Hemorrhage of anus and rectum: Secondary | ICD-10-CM

## 2021-01-07 DIAGNOSIS — K648 Other hemorrhoids: Secondary | ICD-10-CM

## 2021-01-07 MED ORDER — SODIUM CHLORIDE 0.9 % IV SOLN: 500.0000 mL | Freq: Once | INTRAVENOUS | Status: DC

## 2021-01-07 NOTE — Op Note (Signed)
[place] Patient Name: Russ Gaster Procedure Date: 01/07/2021 1:38 PM MRN: [id number] Endoscopist: Milus Banister , MD Age: 60 Referring MD:  Date of Birth: 05-15-1961 Gender: Male Account #: [id number] Procedure:                Colonoscopy Indications:              Rectal bleeding, minor Medicines:                Monitored Anesthesia Care Procedure:                Pre-Anesthesia Assessment:                           - Prior to the procedure, a History and Physical                            was performed, and patient medications and                            allergies were reviewed. The patient's tolerance of                            previous anesthesia was also reviewed. The risks                            and benefits of the procedure and the sedation                            options and risks were discussed with the patient.                            All questions were answered, and informed consent                            was obtained. Prior Anticoagulants: The patient has                            taken no previous anticoagulant or antiplatelet                            agents. ASA Grade Assessment: II - A patient with                            mild systemic disease. After reviewing the risks                            and benefits, the patient was deemed in                            satisfactory condition to undergo the procedure.                           After obtaining informed consent, the colonoscope  was passed under direct vision. Throughout the                            procedure, the patient's blood pressure, pulse, and                            oxygen saturations were monitored continuously. The                            Olympus CF-HQ190L Colonoscope was introduced                            through the anus and advanced to the the cecum,                            identified by appendiceal orifice and  ileocecal                            valve. The colonoscopy was performed without                            difficulty. The patient tolerated the procedure                            well. The quality of the bowel preparation was                            excellent. Scope In: 1:47:40 PM Scope Out: 1:57:13 PM Scope Withdrawal Time: 0 hours 7 minutes 15 seconds  Total Procedure Duration: 0 hours 9 minutes 33 seconds  Findings:                 External and internal hemorrhoids were found. The                            hemorrhoids were small.                           The exam was otherwise without abnormality on                            direct and retroflexion views. Complications:            No immediate complications. Estimated blood loss:                            None. Estimated Blood Loss:     Estimated blood loss: none. Impression:               - Small external and internal hemorrhoids.                           - The examination was otherwise normal on direct                            and retroflexion views.                           -  No polyps or cancers. Recommendation:           - Patient has a contact number available for                            emergencies. The signs and symptoms of potential                            delayed complications were discussed with the                            patient. Return to normal activities tomorrow.                            Written discharge instructions were provided to the                            patient.                           - Resume previous diet.                           - Continue present medications.                           - Repeat colonoscopy in 10 years for screening. Milus Banister, MD 01/07/2021 2:00:48 PM This report has been signed electronically.

## 2021-01-07 NOTE — Progress Notes (Signed)
No changes since OV less than 30 days ago.

## 2021-01-07 NOTE — Patient Instructions (Signed)
Thank you for letting us take care of your healthcare needs today. Please see handouts given to you on Hemorrhoids.   YOU HAD AN ENDOSCOPIC PROCEDURE TODAY AT [facility]:   Refer to the procedure report that was given to you for any specific questions about what was found during the examination.  If the procedure report does not answer your questions, please call your gastroenterologist to clarify.  If you requested that your care partner not be given the details of your procedure findings, then the procedure report has been included in a sealed envelope for you to review at your convenience later.  YOU SHOULD EXPECT: Some feelings of bloating in the abdomen. Passage of more gas than usual.  Walking can help get rid of the air that was put into your GI tract during the procedure and reduce the bloating. If you had a lower endoscopy (such as a colonoscopy or flexible sigmoidoscopy) you may notice spotting of blood in your stool or on the toilet paper. If you underwent a bowel prep for your procedure, you may not have a normal bowel movement for a few days.  Please Note:  You might notice some irritation and congestion in your nose or some drainage.  This is from the oxygen used during your procedure.  There is no need for concern and it should clear up in a day or so.  SYMPTOMS TO REPORT IMMEDIATELY:  Following lower endoscopy (colonoscopy or flexible sigmoidoscopy):  Excessive amounts of blood in the stool  Significant tenderness or worsening of abdominal pains  Swelling of the abdomen that is new, acute  Fever of 100F or higher   For urgent or emergent issues, a gastroenterologist can be reached at any hour by calling [phone]. Do not use MyChart messaging for urgent concerns.    DIET:  We do recommend a small meal at first, but then you may proceed to your regular diet.  Drink plenty of fluids but you should avoid alcoholic beverages for 24 hours.  ACTIVITY:  You  should plan to take it easy for the rest of today and you should NOT DRIVE or use heavy machinery until tomorrow (because of the sedation medicines used during the test).    FOLLOW UP: Our staff will call the number listed on your records 48-72 hours following your procedure to check on you and address any questions or concerns that you may have regarding the information given to you following your procedure. If we do not reach you, we will leave a message.  We will attempt to reach you two times.  During this call, we will ask if you have developed any symptoms of COVID 19. If you develop any symptoms (ie: fever, flu-like symptoms, shortness of breath, cough etc.) before then, please call [phone].  If you test positive for Covid 19 in the 2 weeks post procedure, please call and report this information to [place].    If any biopsies were taken you will be contacted by phone or by letter within the next 1-3 weeks.  Please call us at [phone] if you have not heard about the biopsies in 3 weeks.    SIGNATURES/CONFIDENTIALITY: You and/or your care partner have signed paperwork which will be entered into your electronic medical record.  These signatures attest to the fact that that the information above on your After Visit Summary has been reviewed and is understood.  Full responsibility of the confidentiality of this discharge information lies with you  and/or your care-partner.

## 2021-01-07 NOTE — Progress Notes (Signed)
Report to PACU, RN, vss, BBS= Clear.  

## 2021-01-07 NOTE — Progress Notes (Signed)
VS-AS  

## 2021-01-09 ENCOUNTER — Telehealth: Payer: Self-pay

## 2021-01-09 NOTE — Telephone Encounter (Signed)
  Follow up Call-  Call back number 01/07/2021  Post procedure Call Back phone  # [phone]  Permission to leave phone message Yes  Some recent data might be hidden     Patient questions:  Do you have a fever, pain , or abdominal swelling? No. Pain Score  0 *  Have you tolerated food without any problems? Yes.    Have you been able to return to your normal activities? Yes.    Do you have any questions about your discharge instructions: Diet   No. Medications  No. Follow up visit  No.  Do you have questions or concerns about your Care? No.  Actions: * If pain score is 4 or above: No action needed, pain <4.  Have you developed a fever since your procedure? no  2.   Have you had an respiratory symptoms (SOB or cough) since your procedure? no  3.   Have you tested positive for COVID 19 since your procedure no  4.   Have you had any family members/close contacts diagnosed with the COVID 19 since your procedure?  no   If yes to any of these questions please route to Joylene John, RN and Joella Prince, RN

## 2021-01-16 ENCOUNTER — Ambulatory Visit: Payer: 59 | Admitting: Family Medicine

## 2021-01-16 ENCOUNTER — Other Ambulatory Visit: Payer: Self-pay

## 2021-01-16 VITALS — BP 116/74 | HR 67 | Temp 98.3°F | Resp 16 | Ht 64.0 in | Wt 166.0 lb

## 2021-01-16 DIAGNOSIS — Z8719 Personal history of other diseases of the digestive system: Secondary | ICD-10-CM

## 2021-01-16 DIAGNOSIS — I10 Essential (primary) hypertension: Secondary | ICD-10-CM | POA: Diagnosis not present

## 2021-01-16 DIAGNOSIS — K2100 Gastro-esophageal reflux disease with esophagitis, without bleeding: Secondary | ICD-10-CM | POA: Diagnosis not present

## 2021-01-16 DIAGNOSIS — R7303 Prediabetes: Secondary | ICD-10-CM | POA: Diagnosis not present

## 2021-01-16 LAB — BASIC METABOLIC PANEL
BUN: 20 mg/dL (ref 6–23)
CO2: 25 mEq/L (ref 19–32)
Calcium: 9.6 mg/dL (ref 8.4–10.5)
Chloride: 103 mEq/L (ref 96–112)
Creatinine, Ser: 1.21 mg/dL (ref 0.40–1.50)
GFR: 66.3 mL/min (ref >60.00)
Glucose, Bld: 83 mg/dL (ref 70–99)
Potassium: 4.1 mEq/L (ref 3.5–5.1)
Sodium: 138 mEq/L (ref 135–145)

## 2021-01-16 LAB — HEMOGLOBIN A1C: Hgb A1c MFr Bld: 5.8 % (ref 4.6–6.5)

## 2021-01-16 MED ORDER — OMEPRAZOLE 40 MG PO CPDR: DELAYED_RELEASE_CAPSULE | ORAL | 1 refills | Status: DC

## 2021-01-16 MED ORDER — HYDROCHLOROTHIAZIDE 12.5 MG PO CAPS: ORAL_CAPSULE | ORAL | 3 refills | Status: DC

## 2021-01-16 NOTE — Progress Notes (Signed)
Subjective:  Patient ID: Justin Farley, male    DOB: 1960/07/23  Age: 60 y.o. MRN: [id number]  CC:  Chief Complaint  Patient presents with   Hemorrhoids    Pt here to follow up on red blood in stools has had colonoscopy and is doing well, no concerns, pt reports procedure [place] [facility]    Hypertension    Pt notes no physical sxs, feels well. Bp in range today    Gastroesophageal Reflux    Pt doing well no concerns pt is in need of a refill     HPI LO ROSTON presents for   Hypertension: Hctz 12.'5mg'$  qd.  Home readings: 116/70?  No new side effects, lightheadedness or dizziness.   BP Readings from Last 3 Encounters:  01/16/21 116/74  01/07/21 101/66  12/12/20 124/90   Lab Results  Component Value Date   CREATININE 1.06 07/19/2020   Lab Results  Component Value Date   CHOL 160 07/19/2020   HDL 58 07/19/2020   LDLCALC 89 07/19/2020   TRIG 66 07/19/2020   CHOLHDL 2.8 07/19/2020     GERD:  Prilosec '40mg'$  QD- increased to BID by GI in July - doing better. Treated by gastroenterology for BRBPR, hemorrhoids. Colonoscopy by Dr. Ardis Hughs. Small hemorrhoids, no treatment for now.   Prediabetes: Exercise 4 d per week. Has cut back on fast food and sodas.  No polyuria, polydipsia or vision changes.  Lab Results  Component Value Date   HGBA1C 5.8 (H) 07/19/2020   Wt Readings from Last 3 Encounters:  01/16/21 166 lb (75.3 kg)  01/07/21 162 lb (73.5 kg)  12/12/20 162 lb (73.5 kg)     History Patient Active Problem List   Diagnosis Date Noted   Internal hemorrhoids with grade 2 prolapse 11/28/2014   HTN (hypertension) 04/19/2013   GERD (gastroesophageal reflux disease) 04/19/2013   Past Medical History:  Diagnosis Date   Allergy    Colon polyps    Esophagitis    Gastritis    GERD (gastroesophageal reflux disease)    Hypertension    Internal hemorrhoids with grade 2 prolapse 11/28/2014   Past Surgical History:  Procedure Laterality Date   COLONOSCOPY      ESOPHAGOGASTRODUODENOSCOPY     HEMORRHOID BANDING     WISDOM TOOTH EXTRACTION  2009   Allergies  Allergen Reactions   Lamisil [Terbinafine]     Sharp stomach pain   Shellfish Allergy    Prior to Admission medications   Medication Sig Start Date End Date Taking? Authorizing Provider  albuterol (VENTOLIN HFA) 108 (90 Base) MCG/ACT inhaler Inhale 2 puffs into the lungs every 4 (four) hours as needed for wheezing. 10/03/19  Yes Wendie Agreste, MD  betamethasone dipropionate 0.05 % cream Apply topically daily. 02/29/20  Yes Maximiano Coss, NP  hydrochlorothiazide (MICROZIDE) 12.5 MG capsule TAKE 1 CAPSULE EVERY MORNING. 07/19/20  Yes Wendie Agreste, MD  omeprazole (PRILOSEC) 40 MG capsule TAKE 1 CAPSULE (40 MG TOTAL) BY MOUTH DAILY.as needed 07/19/20  Yes Wendie Agreste, MD   Social History   Socioeconomic History   Marital status: Single    Spouse name: Not on file   Number of children: 4   Years of education: Not on file   Highest education level: Not on file  Occupational History   Occupation: Freight forwarder   Occupation: supervisor    Comment: [employer]  Tobacco Use   Smoking status: Never   Smokeless tobacco: Never  Vaping Use  Vaping Use: Never used  Substance and Sexual Activity   Alcohol use: Yes    Comment: special occasions   Drug use: No   Sexual activity: Yes    Birth control/protection: Condom  Other Topics Concern   Not on file  Social History Narrative   Single. Education: [employer]. Exercise: 3 days a week for 2 hours.   Social Determinants of Health   Financial Resource Strain: Not on file  Food Insecurity: Not on file  Transportation Needs: Not on file  Physical Activity: Not on file  Stress: Not on file  Social Connections: Not on file  Intimate Partner Violence: Not on file    Review of Systems  Constitutional:  Negative for fatigue and unexpected weight change.  Eyes:  Negative for visual disturbance.  Respiratory:  Negative  for cough, chest tightness and shortness of breath.   Cardiovascular:  Negative for chest pain, palpitations and leg swelling.  Gastrointestinal:  Negative for abdominal pain and blood in stool.  Neurological:  Negative for dizziness, light-headedness and headaches.    Objective:   Vitals:   01/16/21 1112  BP: 116/74  Pulse: 67  Resp: 16  Temp: 98.3 F (36.8 C)  TempSrc: Temporal  SpO2: 97%  Weight: 166 lb (75.3 kg)  Height: '5\' 4"'$  (1.626 m)     Physical Exam Vitals reviewed.  Constitutional:      Appearance: He is well-developed.  HENT:     Head: Normocephalic and atraumatic.  Neck:     Vascular: No carotid bruit or JVD.  Cardiovascular:     Rate and Rhythm: Normal rate and regular rhythm.     Heart sounds: Normal heart sounds. No murmur heard. Pulmonary:     Effort: Pulmonary effort is normal.     Breath sounds: Normal breath sounds. No rales.  Musculoskeletal:     Right lower leg: No edema.     Left lower leg: No edema.  Skin:    General: Skin is warm and dry.  Neurological:     Mental Status: He is alert and oriented to person, place, and time.  Psychiatric:        Mood and Affect: Mood normal.       Assessment & Plan:  Justin Farley is a 60 y.o. male . History of hemorrhoids Gastroesophageal reflux disease with esophagitis, unspecified whether hemorrhage - Plan: omeprazole (PRILOSEC) 40 MG capsule  -Status post colonoscopy as above, continued on higher dose omeprazole for now with gastroenterology follow-up as planned.  Essential hypertension - Plan: hydrochlorothiazide (MICROZIDE) 12.5 MG capsule, Basic metabolic panel  -Stable, tolerating current regimen, continue same, labs as above  Prediabetes - Plan: Hemoglobin 123456, Basic metabolic panel  -Commended on diet changes, check A1c, electrolytes.  Meds ordered this encounter  Medications   hydrochlorothiazide (MICROZIDE) 12.5 MG capsule    Sig: TAKE 1 CAPSULE EVERY MORNING.    Dispense:  90  capsule    Refill:  3   omeprazole (PRILOSEC) 40 MG capsule    Sig: TAKE 1 CAPSULE (40 MG TOTAL) BY MOUTH DAILY.as needed    Dispense:  90 capsule    Refill:  1   There are no Patient Instructions on file for this visit.    Signed,   [name], MD [place] 01/16/21 11:53 AM

## 2021-03-25 ENCOUNTER — Ambulatory Visit: Payer: 59 | Attending: Internal Medicine

## 2021-03-25 DIAGNOSIS — Z23 Encounter for immunization: Secondary | ICD-10-CM

## 2021-03-25 NOTE — Progress Notes (Signed)
   Covid-19 Vaccination Clinic  Name:  Justin Farley    MRN: [id number] DOB: [birth year]  03/25/2021  Mr. Justin Farley was observed post Covid-19 immunization for 15 minutes without incident. He was provided with Vaccine Information Sheet and instruction to access the V-Safe system.   Mr. Justin Farley was instructed to call 911 with any severe reactions post vaccine: Difficulty breathing  Swelling of face and throat  A fast heartbeat  A bad rash all over body  Dizziness and weakness   Immunizations Administered     Name Date Dose VIS Date Route   Pfizer Covid-19 Vaccine Bivalent Booster 03/25/2021  9:39 AM 0.3 mL 01/30/2021 Intramuscular   Manufacturer: [place]   Lot: [id number]   [place]: [phone]

## 2021-04-19 ENCOUNTER — Other Ambulatory Visit (HOSPITAL_BASED_OUTPATIENT_CLINIC_OR_DEPARTMENT_OTHER): Payer: Self-pay

## 2021-04-19 MED ORDER — PFIZER COVID-19 VAC BIVALENT 30 MCG/0.3ML IM SUSP
INTRAMUSCULAR | 0 refills | Status: DC
  Filled 2021-04-19: qty 0.3, 1d supply, fill #0

## 2021-05-10 LAB — HM DIABETES EYE EXAM

## 2021-06-16 ENCOUNTER — Encounter (HOSPITAL_COMMUNITY): Payer: Self-pay | Admitting: Emergency Medicine

## 2021-06-16 ENCOUNTER — Emergency Department (HOSPITAL_COMMUNITY): Payer: 59

## 2021-06-16 ENCOUNTER — Other Ambulatory Visit: Payer: Self-pay

## 2021-06-16 ENCOUNTER — Emergency Department (HOSPITAL_COMMUNITY)
Admission: EM | Admit: 2021-06-16 | Discharge: 2021-06-16 | Disposition: A | Discharge status: Home / Self Care | Payer: 59 | Attending: Emergency Medicine | Admitting: Emergency Medicine

## 2021-06-16 DIAGNOSIS — I10 Essential (primary) hypertension: Secondary | ICD-10-CM | POA: Insufficient documentation

## 2021-06-16 DIAGNOSIS — Z79899 Other long term (current) drug therapy: Secondary | ICD-10-CM | POA: Diagnosis not present

## 2021-06-16 DIAGNOSIS — R0789 Other chest pain: Secondary | ICD-10-CM | POA: Insufficient documentation

## 2021-06-16 LAB — BASIC METABOLIC PANEL
Anion gap: 9 (ref 5–15)
BUN: 17 mg/dL (ref 6–20)
CO2: 25 mmol/L (ref 22–32)
Calcium: 8.9 mg/dL (ref 8.9–10.3)
Chloride: 106 mmol/L (ref 98–111)
Creatinine, Ser: 1.23 mg/dL (ref 0.61–1.24)
GFR, Estimated: >60 mL/min (ref >60)
Glucose, Bld: 100 mg/dL — ABNORMAL HIGH (ref 70–99)
Potassium: 4.1 mmol/L (ref 3.5–5.1)
Sodium: 140 mmol/L (ref 135–145)

## 2021-06-16 LAB — CBC
HCT: 41.5 % (ref 39.0–52.0)
Hemoglobin: 14 g/dL (ref 13.0–17.0)
MCH: 29.7 pg (ref 26.0–34.0)
MCHC: 33.7 g/dL (ref 30.0–36.0)
MCV: 88.1 fL (ref 80.0–100.0)
Platelets: 186 10*3/uL (ref 150–400)
RBC: 4.71 MIL/uL (ref 4.22–5.81)
RDW: 13.2 % (ref 11.5–15.5)
WBC: 4.1 10*3/uL (ref 4.0–10.5)
nRBC: 0 % (ref 0.0–0.2)

## 2021-06-16 LAB — TROPONIN I (HIGH SENSITIVITY)
Troponin I (High Sensitivity): 4 ng/L (ref <18)
Troponin I (High Sensitivity): 3 ng/L (ref <18)

## 2021-06-16 MED ORDER — ACETAMINOPHEN 500 MG PO TABS
1000.0000 mg | ORAL_TABLET | Freq: Once | ORAL | Status: AC
  Administered 2021-06-16: 1000 mg via ORAL
  Filled 2021-06-16: qty 2

## 2021-06-16 NOTE — Discharge Instructions (Addendum)
All the blood test and x-ray look good today.  You can take Tylenol as needed for the pain and avoid chest workouts until the pain goes away.  However if the pain suddenly becomes worse starts moving into other areas, you are passing out or feeling short of breath you should return to the emergency room.

## 2021-06-16 NOTE — ED Triage Notes (Signed)
Pt woke up with L sided chest pain and dizziness at 6:30am today.  Denies sob, nausea, and vomiting.  Pain constant and worse with deep inspiration.

## 2021-06-16 NOTE — ED Provider Notes (Signed)
[facility] EMERGENCY DEPARTMENT Provider Note   CSN: [id number] Arrival date & time: 06/16/21  1829     History  Chief Complaint  Patient presents with   Chest Pain    Justin Farley is a 61 y.o. male.  Patient is a 61 year old male with a history of hypertension on hydrochlorothiazide and GI issues on omeprazole who is presenting today with complaint of chest pain.  Patient reports that yesterday was a normal day and this morning around 630 he woke up and had a sharp uncomfortable sensation in his left chest.  He got up and tried to take a shower and the pain seemed to get worse and it made him feel a little bit nauseated and lightheaded but much worse when taking a deep breath and when he moves his left arm.  He is not having pain radiating down into his arm, his abdomen or into his back.  He denies feeling short of breath.  He does report that he is very active and works out and did do a workout yesterday with chest exercises.  He has no family history significant for MI or CAD.  He does not use tobacco products.  He does not have family history of blood clots except for 1 uncle and he has not had any recent immobilization, unilateral leg pain or swelling.  He does not drink excessive alcohol or use drugs.  He has not taken his hydrochlorothiazide today.  He did take 2 baby aspirin today when he had the pain but no other medications.  Denies recent cough, congestion or fever.  The history is provided by the patient and medical records.  Chest Pain     Home Medications Prior to Admission medications   Medication Sig Start Date End Date Taking? Authorizing Provider  albuterol (VENTOLIN HFA) 108 (90 Base) MCG/ACT inhaler Inhale 2 puffs into the lungs every 4 (four) hours as needed for wheezing. 10/03/19   Wendie Agreste, MD  betamethasone dipropionate 0.05 % cream Apply topically daily. 02/29/20   Maximiano Coss, NP  COVID-19 mRNA bivalent vaccine, Pfizer, (PFIZER  COVID-19 VAC BIVALENT) injection Inject into the muscle. 03/25/21   Carlyle Basques, MD  hydrochlorothiazide (MICROZIDE) 12.5 MG capsule TAKE 1 CAPSULE EVERY MORNING. 01/16/21   Wendie Agreste, MD  omeprazole (PRILOSEC) 40 MG capsule TAKE 1 CAPSULE (40 MG TOTAL) BY MOUTH DAILY.as needed 01/16/21   Wendie Agreste, MD      Allergies    Lamisil [terbinafine] and Shellfish allergy    Review of Systems   Review of Systems  Cardiovascular:  Positive for chest pain.   Physical Exam Updated Vital Signs BP (!) 147/99    Pulse 63    Temp 98.3 F (36.8 C)    Resp 15    SpO2 99%  Physical Exam Vitals and nursing note reviewed.  Constitutional:      General: He is not in acute distress.    Appearance: He is well-developed.  HENT:     Head: Normocephalic and atraumatic.  Eyes:     Conjunctiva/sclera: Conjunctivae normal.     Pupils: Pupils are equal, round, and reactive to light.  Cardiovascular:     Rate and Rhythm: Normal rate and regular rhythm.     Pulses: Normal pulses.     Heart sounds: No murmur heard. Pulmonary:     Effort: Pulmonary effort is normal. No respiratory distress.     Breath sounds: Normal breath sounds. No wheezing or rales.  Chest:     Chest wall: No tenderness.  Abdominal:     General: There is no distension.     Palpations: Abdomen is soft.     Tenderness: There is no abdominal tenderness. There is no guarding or rebound.  Musculoskeletal:        General: No tenderness. Normal range of motion.     Cervical back: Normal range of motion and neck supple.  Skin:    General: Skin is warm and dry.     Findings: No erythema or rash.  Neurological:     Mental Status: He is alert and oriented to person, place, and time. Mental status is at baseline.  Psychiatric:        Mood and Affect: Mood normal.        Behavior: Behavior normal.    ED Results / Procedures / Treatments   Labs (all labs ordered are listed, but only abnormal results are displayed) Labs  Reviewed  BASIC METABOLIC PANEL - Abnormal; Notable for the following components:      Result Value   Glucose, Bld 100 (*)    All other components within normal limits  CBC  TROPONIN I (HIGH SENSITIVITY)  TROPONIN I (HIGH SENSITIVITY)    EKG EKG Interpretation  Date/Time:  Sunday June 16 2021 07:35:56 EST Ventricular Rate:  65 PR Interval:  208 QRS Duration: 98 QT Interval:  394 QTC Calculation: 409 R Axis:   49 Text Interpretation: Normal sinus rhythm Normal ECG No previous ECGs available Confirmed by Blanchie Dessert [phone]) on 06/16/2021 10:27:36 AM  Radiology DG Chest 2 View  Result Date: 06/16/2021 CLINICAL DATA:  Chest pain EXAM: CHEST - 2 VIEW COMPARISON:  12/08/2019 FINDINGS: The heart size and mediastinal contours are within normal limits. Both lungs are clear. The visualized skeletal structures are unremarkable. IMPRESSION: No active cardiopulmonary disease. Electronically Signed   By: Jorje Guild M.D.   On: 06/16/2021 08:04    Procedures Procedures    Medications Ordered in ED Medications  acetaminophen (TYLENOL) tablet 1,000 mg (has no administration in time range)    ED Course/ Medical Decision Making/ A&P                           Medical Decision Making  Patient is a 62 year old male who is in good health and shape who is presenting today with complaints of nonspecific left-sided chest pain.  It is worse with moving his arm and taking a deep breath.  This is in the setting of him doing chest exercises yesterday.  He denies any respiratory symptoms.  Low suspicion for pneumothorax today, pneumonia, COVID.  His EKG and it is within normal limits.  I independently evaluated patient's chest x-ray and there are no including normal appearance of the aortic notch without any widening of the mediastinum.  Independently interpreted patient's labs are all wnl.  Pt is htn here today but has not taken his hctz.  Low suspicion for dissection, PE or ACS today. Will  get delta trop as pt's sx started shortly after arrival.  [facility] neg and low risk with Heart score of 2.  Low suspicion for GI today and pt given tylenol for msk pain.  12:01 PM Delta troponin is normal.  On repeat evaluation patient is still well-appearing.  Blood pressure is now normal at 136/80.  Findings discussed with the patient and his wife who are in the room.  He was given return precautions.  Questions were answered.  Patient appears stable for discharge and has no social barriers at this time.        Final Clinical Impression(s) / ED Diagnoses Final diagnoses:  Chest wall pain    Rx / DC Orders ED Discharge Orders     None         Blanchie Dessert, MD 06/16/21 1201

## 2021-07-17 ENCOUNTER — Ambulatory Visit (INDEPENDENT_AMBULATORY_CARE_PROVIDER_SITE_OTHER): Payer: 59 | Admitting: Family Medicine

## 2021-07-17 ENCOUNTER — Encounter: Payer: Self-pay | Admitting: Family Medicine

## 2021-07-17 VITALS — BP 128/74 | HR 65 | Temp 98.2°F | Resp 16 | Ht 64.0 in | Wt 167.2 lb

## 2021-07-17 DIAGNOSIS — Z0000 Encounter for general adult medical examination without abnormal findings: Secondary | ICD-10-CM

## 2021-07-17 DIAGNOSIS — Z130 Encounter for screening for diseases of the blood and blood-forming organs and certain disorders involving the immune mechanism: Secondary | ICD-10-CM | POA: Diagnosis not present

## 2021-07-17 DIAGNOSIS — Z125 Encounter for screening for malignant neoplasm of prostate: Secondary | ICD-10-CM

## 2021-07-17 DIAGNOSIS — N529 Male erectile dysfunction, unspecified: Secondary | ICD-10-CM

## 2021-07-17 DIAGNOSIS — K2100 Gastro-esophageal reflux disease with esophagitis, without bleeding: Secondary | ICD-10-CM

## 2021-07-17 DIAGNOSIS — L739 Follicular disorder, unspecified: Secondary | ICD-10-CM

## 2021-07-17 DIAGNOSIS — R7303 Prediabetes: Secondary | ICD-10-CM | POA: Diagnosis not present

## 2021-07-17 DIAGNOSIS — I10 Essential (primary) hypertension: Secondary | ICD-10-CM | POA: Diagnosis not present

## 2021-07-17 DIAGNOSIS — Z13220 Encounter for screening for lipoid disorders: Secondary | ICD-10-CM

## 2021-07-17 LAB — CBC WITH DIFFERENTIAL/PLATELET
Basophils Absolute: 0 10*3/uL (ref 0.0–0.1)
Basophils Relative: 0.7 % (ref 0.0–3.0)
Eosinophils Absolute: 0 10*3/uL (ref 0.0–0.7)
Eosinophils Relative: 0.9 % (ref 0.0–5.0)
HCT: 43.6 % (ref 39.0–52.0)
Hemoglobin: 14.3 g/dL (ref 13.0–17.0)
Lymphocytes Relative: 47.1 % — ABNORMAL HIGH (ref 12.0–46.0)
Lymphs Abs: 1.6 10*3/uL (ref 0.7–4.0)
MCHC: 32.8 g/dL (ref 30.0–36.0)
MCV: 88.5 fl (ref 78.0–100.0)
Monocytes Absolute: 0.3 10*3/uL (ref 0.1–1.0)
Monocytes Relative: 8 % (ref 3.0–12.0)
Neutro Abs: 1.5 10*3/uL (ref 1.4–7.7)
Neutrophils Relative %: 43.3 % (ref 43.0–77.0)
Platelets: 168 10*3/uL (ref 150.0–400.0)
RBC: 4.93 Mil/uL (ref 4.22–5.81)
RDW: 14 % (ref 11.5–15.5)
WBC: 3.5 10*3/uL — ABNORMAL LOW (ref 4.0–10.5)

## 2021-07-17 LAB — COMPREHENSIVE METABOLIC PANEL
ALT: 23 U/L (ref 0–53)
AST: 24 U/L (ref 0–37)
Albumin: 4.8 g/dL (ref 3.5–5.2)
Alkaline Phosphatase: 30 U/L — ABNORMAL LOW (ref 39–117)
BUN: 16 mg/dL (ref 6–23)
CO2: 25 mEq/L (ref 19–32)
Calcium: 9.4 mg/dL (ref 8.4–10.5)
Chloride: 105 mEq/L (ref 96–112)
Creatinine, Ser: 1.15 mg/dL (ref 0.40–1.50)
GFR: 70.23 mL/min (ref >60.00)
Glucose, Bld: 99 mg/dL (ref 70–99)
Potassium: 4 mEq/L (ref 3.5–5.1)
Sodium: 140 mEq/L (ref 135–145)
Total Bilirubin: 0.7 mg/dL (ref 0.2–1.2)
Total Protein: 8.2 g/dL (ref 6.0–8.3)

## 2021-07-17 LAB — HEMOGLOBIN A1C: Hgb A1c MFr Bld: 5.9 % (ref 4.6–6.5)

## 2021-07-17 LAB — TESTOSTERONE: Testosterone: 432.3 ng/dL (ref 300.00–890.00)

## 2021-07-17 LAB — LIPID PANEL
Cholesterol: 192 mg/dL (ref 0–200)
HDL: 54.2 mg/dL (ref >39.00)
LDL Cholesterol: 109 mg/dL — ABNORMAL HIGH (ref 0–99)
NonHDL: 137.68
Total CHOL/HDL Ratio: 4
Triglycerides: 142 mg/dL (ref 0.0–149.0)
VLDL: 28.4 mg/dL (ref 0.0–40.0)

## 2021-07-17 LAB — PSA: PSA: 3.97 ng/mL (ref 0.10–4.00)

## 2021-07-17 MED ORDER — BETAMETHASONE DIPROPIONATE 0.05 % EX CREA: TOPICAL_CREAM | Freq: Every day | CUTANEOUS | 0 refills | Status: DC

## 2021-07-17 MED ORDER — SILDENAFIL CITRATE 100 MG PO TABS
50.0000 mg | ORAL_TABLET | Freq: Every day | ORAL | 11 refills | Status: DC | PRN
Start: 2021-07-17 — End: 2022-08-06

## 2021-07-17 MED ORDER — OMEPRAZOLE 40 MG PO CPDR: DELAYED_RELEASE_CAPSULE | ORAL | 1 refills | Status: DC

## 2021-07-17 NOTE — Progress Notes (Signed)
Subjective:  Patient ID: Justin Farley, male    DOB: 19-Aug-1960  Age: 61 y.o. MRN: [id number]  CC:  Chief Complaint  Patient presents with   Annual Exam    Pt here for annual exam , would like his testosterone checked if possible     HPI Justin Farley presents for  Annual physical exam. Also requests testosterone level. Few times during intercourse loss of tumescence. Not every time. Same partner. Relationship is going well, no new stressors. Exercising, no change in strength or muscle mass.   Hypertension: HCTZ 12.5 mg daily Home readings: none, no new side effects.  BP Readings from Last 3 Encounters:  07/17/21 128/74  06/16/21 (!) 134/92  01/16/21 116/74   Lab Results  Component Value Date   CREATININE 1.23 06/16/2021   Prediabetes: Weight overall stable from August visit. Exercising, watching diet.  Lab Results  Component Value Date   HGBA1C 5.8 01/16/2021   Wt Readings from Last 3 Encounters:  07/17/21 167 lb 3.2 oz (75.8 kg)  06/16/21 160 lb (72.6 kg)  01/16/21 166 lb (75.3 kg)   GERD Omeprazole 40 mg QD - controlling symptoms, had breakthrough with intermittent dosing.   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 Needs refill   Depression screen [facility] 2/9 07/17/2021 01/16/2021 11/09/2020 11/05/2020 07/19/2020  Decreased Interest 0 0 0 0 0  Down, Depressed, Hopeless 0 0 0 0 0  PHQ - 2 Score 0 0 0 0 0  Altered sleeping 0 - 0 0 -  Tired, decreased energy 0 - 0 0 -  Change in appetite 0 - 0 0 -  Feeling bad or failure about yourself  0 - 0 0 -  Trouble concentrating 0 - 0 0 -  Moving slowly or fidgety/restless 0 - 0 0 -  Suicidal thoughts 0 - 0 0 -  PHQ-9 Score 0 - 0 0 -  Difficult doing work/chores - - - - -  Some recent data might be hidden    Cancer screening Colonoscopy 01/07/2021, Dr. Ardis Hughs, small external and internal hemorrhoids, repeat 10 years. Lab Results  Component Value Date   PSA1 3.5 04/08/2019   PSA1 3.9  03/30/2018   PSA1 3.9 09/30/2017   PSA 4.0 01/31/2016   PSA 2.66 01/30/2015   PSA 2.25 02/21/2013  Uncle with prostate. The natural history of prostate cancer and ongoing controversy regarding screening and potential treatment outcomes of prostate cancer has been discussed with the patient. The meaning of a false positive PSA and a false negative PSA has been discussed. He indicates understanding of the limitations of this screening test and wishes to proceed with screening PSA testing.  Immunization History  Administered Date(s) Administered   Influenza,inj,Quad PF,6+ Mos 03/14/2013, 03/27/2017, 03/30/2018   Influenza-Unspecified 03/02/2014, 03/03/2015   PFIZER(Purple Top)SARS-COV-2 Vaccination 08/27/2019, 09/17/2019, 03/29/2020, 09/08/2020   Pfizer Covid-19 Vaccine Bivalent Booster 83yrs & up 03/25/2021   Tdap 03/27/2017   Zoster Recombinat (Shingrix) 04/04/2019, 06/04/2019  Influenza vaccine few months ago at pharmacy.   No results found. Optho visit few months ago - [facility] Justin Farley  Dental: appt soon. Q70mo  Alcohol: 1 drink or less per month.   Tobacco: none  Exercise: at least 4-5 days/week, 1-1.5hrs.    Patient Active Problem List   Diagnosis Date Noted   Internal hemorrhoids with grade 2 prolapse 11/28/2014   HTN (hypertension) 04/19/2013   GERD (gastroesophageal reflux disease) 04/19/2013   Past Medical History:  Diagnosis Date  Allergy    Colon polyps    Esophagitis    Gastritis    GERD (gastroesophageal reflux disease)    Hypertension    Internal hemorrhoids with grade 2 prolapse 11/28/2014   Past Surgical History:  Procedure Laterality Date   COLONOSCOPY     ESOPHAGOGASTRODUODENOSCOPY     HEMORRHOID BANDING     WISDOM TOOTH EXTRACTION  2009   Allergies  Allergen Reactions   Lamisil [Terbinafine]     Sharp stomach pain   Shellfish Allergy    Prior to Admission medications   Medication Sig Start Date End Date Taking? Authorizing Provider   albuterol (VENTOLIN HFA) 108 (90 Base) MCG/ACT inhaler Inhale 2 puffs into the lungs every 4 (four) hours as needed for wheezing. 10/03/19  Yes Wendie Agreste, MD  hydrochlorothiazide (MICROZIDE) 12.5 MG capsule TAKE 1 CAPSULE EVERY MORNING. 01/16/21  Yes Wendie Agreste, MD  omeprazole (PRILOSEC) 40 MG capsule TAKE 1 CAPSULE (40 MG TOTAL) BY MOUTH DAILY.as needed 01/16/21  Yes Wendie Agreste, MD  betamethasone dipropionate 0.05 % cream Apply topically daily. Patient not taking: Reported on 07/17/2021 02/29/20   Maximiano Coss, NP   Social History   Socioeconomic History   Marital status: Married    Spouse name: Not on file   Number of children: 4   Years of education: Not on file   Highest education level: Not on file  Occupational History   Occupation: Freight forwarder   Occupation: supervisor    Comment: [employer]  Tobacco Use   Smoking status: Never   Smokeless tobacco: Never  Vaping Use   Vaping Use: Never used  Substance and Sexual Activity   Alcohol use: Yes    Comment: special occasions   Drug use: No   Sexual activity: Yes    Birth control/protection: Condom  Other Topics Concern   Not on file  Social History Narrative   Single. Education: [employer]. Exercise: 3 days a week for 2 hours.   Social Determinants of Health   Financial Resource Strain: Not on file  Food Insecurity: Not on file  Transportation Needs: Not on file  Physical Activity: Not on file  Stress: Not on file  Social Connections: Not on file  Intimate Partner Violence: Not on file    Review of Systems  13 point review of systems per patient health survey noted.  Negative other than as indicated above or in HPI.   Objective:   Vitals:   07/17/21 0757  BP: 128/74  Pulse: 65  Resp: 16  Temp: 98.2 F (36.8 C)  TempSrc: Temporal  SpO2: 98%  Weight: 167 lb 3.2 oz (75.8 kg)  Height: 5\' 4"  (1.626 m)     Physical Exam Vitals reviewed.  Constitutional:      Appearance: He is  well-developed.  HENT:     Head: Normocephalic and atraumatic.     Right Ear: External ear normal.     Left Ear: External ear normal.  Eyes:     Conjunctiva/sclera: Conjunctivae normal.     Pupils: Pupils are equal, round, and reactive to light.  Neck:     Thyroid: No thyromegaly.  Cardiovascular:     Rate and Rhythm: Normal rate and regular rhythm.     Heart sounds: Normal heart sounds.  Pulmonary:     Effort: Pulmonary effort is normal. No respiratory distress.     Breath sounds: Normal breath sounds. No wheezing.  Abdominal:     General: There is no  distension.     Palpations: Abdomen is soft.     Tenderness: There is no abdominal tenderness.  Musculoskeletal:        General: No tenderness. Normal range of motion.     Cervical back: Normal range of motion and neck supple.  Lymphadenopathy:     Cervical: No cervical adenopathy.  Skin:    General: Skin is warm and dry.  Neurological:     Mental Status: He is alert and oriented to person, place, and time.     Deep Tendon Reflexes: Reflexes are normal and symmetric.  Psychiatric:        Behavior: Behavior normal.     Assessment & Plan:  TAVARI LOADHOLT is a 61 y.o. male . Annual physical exam  - -anticipatory guidance as -  below in AVS, screening labs above. Health maintenance items as above in HPI discussed/recommended as applicable.   Folliculitis - Plan: betamethasone dipropionate 0.05 % cream  - pseudofolliculitis, stable with topical betamethasone as needed.  Gastroesophageal reflux disease with esophagitis, unspecified whether hemorrhage - Plan: omeprazole (PRILOSEC) 40 MG capsule  -  Stable, tolerating current regimen. Medications refilled.   Essential hypertension - Plan: Comprehensive metabolic panel  -  Stable, tolerating current regimen.  Labs pending as above.   Prediabetes - Plan: Comprehensive metabolic panel, Hemoglobin A1c  - continue exercise, watching diet.   Screening for hyperlipidemia - Plan:  Comprehensive metabolic panel, Lipid panel  Screening for deficiency anemia - Plan: CBC with Differential/Platelet  Screening for prostate cancer - Plan: PSA  Erectile dysfunction, unspecified erectile dysfunction type - Plan: Testosterone, sildenafil (VIAGRA) 100 MG tablet -Check testosterone but less likely cause.    -viagra Rx given - use lowest effective dose. Side effects discussed (including but not limited to headache/flushing, blue discoloration of vision, possible vascular steal and risk of cardiac effects if underlying unknown coronary artery disease, and permanent sensorineural hearing loss). Understanding expressed.   No orders of the defined types were placed in this encounter.  There are no Patient Instructions on file for this visit.    Signed,   [name], MD [place] 07/17/21 8:10 AM

## 2021-07-17 NOTE — Patient Instructions (Signed)
No med changes at this time. I will let you know if any concerns on labs. Take care.   Preventive Care 2-59 Years Old, Male Preventive care refers to lifestyle choices and visits with your health care provider that can promote health and wellness. Preventive care visits are also called wellness exams. What can I expect for my preventive care visit? Counseling During your preventive care visit, your health care provider may ask about your: Medical history, including: Past medical problems. Family medical history. Current health, including: Emotional well-being. Home life and relationship well-being. Sexual activity. Lifestyle, including: Alcohol, nicotine or tobacco, and drug use. Access to firearms. Diet, exercise, and sleep habits. Safety issues such as seatbelt and bike helmet use. Sunscreen use. Work and work Statistician. Physical exam Your health care provider will check your: Height and weight. These may be used to calculate your BMI (body mass index). BMI is a measurement that tells if you are at a healthy weight. Waist circumference. This measures the distance around your waistline. This measurement also tells if you are at a healthy weight and may help predict your risk of certain diseases, such as type 2 diabetes and high blood pressure. Heart rate and blood pressure. Body temperature. Skin for abnormal spots. What immunizations do I need? Vaccines are usually given at various ages, according to a schedule. Your health care provider will recommend vaccines for you based on your age, medical history, and lifestyle or other factors, such as travel or where you work. What tests do I need? Screening Your health care provider may recommend screening tests for certain conditions. This may include: Lipid and cholesterol levels. Diabetes screening. This is done by checking your blood sugar (glucose) after you have not eaten for a while (fasting). Hepatitis B test. Hepatitis C  test. HIV (human immunodeficiency virus) test. STI (sexually transmitted infection) testing, if you are at risk. Lung cancer screening. Prostate cancer screening. Colorectal cancer screening. Talk with your health care provider about your test results, treatment options, and if necessary, the need for more tests. Follow these instructions at home: Eating and drinking  Eat a diet that includes fresh fruits and vegetables, whole grains, lean protein, and low-fat dairy products. Take vitamin and mineral supplements as recommended by your health care provider. Do not drink alcohol if your health care provider tells you not to drink. If you drink alcohol: Limit how much you have to 0-2 drinks a day. Know how much alcohol is in your drink. In the [place] one drink equals one 12 oz bottle of beer (355 mL), one 5 oz glass of wine (148 mL), or one 1 oz glass of hard liquor (44 mL). Lifestyle Brush your teeth every morning and night with fluoride toothpaste. Floss one time each day. Exercise for at least 30 minutes 5 or more days each week. Do not use any products that contain nicotine or tobacco. These products include cigarettes, chewing tobacco, and vaping devices, such as e-cigarettes. If you need help quitting, ask your health care provider. Do not use drugs. If you are sexually active, practice safe sex. Use a condom or other form of protection to prevent STIs. Take aspirin only as told by your health care provider. Make sure that you understand how much to take and what form to take. Work with your health care provider to find out whether it is safe and beneficial for you to take aspirin daily. Find healthy ways to manage stress, such as: Meditation, yoga, or listening to music.  Journaling. Talking to a trusted person. Spending time with friends and family. Minimize exposure to UV radiation to reduce your risk of skin cancer. Safety Always wear your seat belt while driving or riding in a  vehicle. Do not drive: If you have been drinking alcohol. Do not ride with someone who has been drinking. When you are tired or distracted. While texting. If you have been using any mind-altering substances or drugs. Wear a helmet and other protective equipment during sports activities. If you have firearms in your house, make sure you follow all gun safety procedures. What's next? Go to your health care provider once a year for an annual wellness visit. Ask your health care provider how often you should have your eyes and teeth checked. Stay up to date on all vaccines. This information is not intended to replace advice given to you by your health care provider. Make sure you discuss any questions you have with your health care provider. Document Revised: 11/14/2020 Document Reviewed: 11/14/2020 Elsevier Patient Education  [place].

## 2021-08-07 ENCOUNTER — Encounter: Payer: Self-pay | Admitting: Family Medicine

## 2021-10-22 ENCOUNTER — Other Ambulatory Visit (HOSPITAL_COMMUNITY): Payer: Self-pay

## 2022-01-13 ENCOUNTER — Ambulatory Visit: Payer: 59 | Admitting: Family Medicine

## 2022-01-27 ENCOUNTER — Ambulatory Visit: Payer: 59 | Admitting: Family Medicine

## 2022-01-27 ENCOUNTER — Encounter: Payer: Self-pay | Admitting: Family Medicine

## 2022-01-27 VITALS — BP 138/88 | HR 77 | Temp 98.2°F | Ht 64.0 in | Wt 162.4 lb

## 2022-01-27 DIAGNOSIS — R7303 Prediabetes: Secondary | ICD-10-CM

## 2022-01-27 DIAGNOSIS — I10 Essential (primary) hypertension: Secondary | ICD-10-CM | POA: Diagnosis not present

## 2022-01-27 DIAGNOSIS — K2100 Gastro-esophageal reflux disease with esophagitis, without bleeding: Secondary | ICD-10-CM | POA: Diagnosis not present

## 2022-01-27 DIAGNOSIS — N529 Male erectile dysfunction, unspecified: Secondary | ICD-10-CM | POA: Diagnosis not present

## 2022-01-27 DIAGNOSIS — Z23 Encounter for immunization: Secondary | ICD-10-CM

## 2022-01-27 LAB — LIPID PANEL
Cholesterol: 170 mg/dL (ref 0–200)
HDL: 62.8 mg/dL (ref >39.00)
LDL Cholesterol: 86 mg/dL (ref 0–99)
NonHDL: 106.81
Total CHOL/HDL Ratio: 3
Triglycerides: 103 mg/dL (ref 0.0–149.0)
VLDL: 20.6 mg/dL (ref 0.0–40.0)

## 2022-01-27 LAB — COMPREHENSIVE METABOLIC PANEL
ALT: 36 U/L (ref 0–53)
AST: 28 U/L (ref 0–37)
Albumin: 4.7 g/dL (ref 3.5–5.2)
Alkaline Phosphatase: 27 U/L — ABNORMAL LOW (ref 39–117)
BUN: 12 mg/dL (ref 6–23)
CO2: 29 mEq/L (ref 19–32)
Calcium: 9.7 mg/dL (ref 8.4–10.5)
Chloride: 103 mEq/L (ref 96–112)
Creatinine, Ser: 1.04 mg/dL (ref 0.40–1.50)
GFR: 78.94 mL/min (ref >60.00)
Glucose, Bld: 90 mg/dL (ref 70–99)
Potassium: 4 mEq/L (ref 3.5–5.1)
Sodium: 139 mEq/L (ref 135–145)
Total Bilirubin: 1.2 mg/dL (ref 0.2–1.2)
Total Protein: 7.9 g/dL (ref 6.0–8.3)

## 2022-01-27 LAB — HEMOGLOBIN A1C: Hgb A1c MFr Bld: 5.9 % (ref 4.6–6.5)

## 2022-01-27 MED ORDER — OMEPRAZOLE 40 MG PO CPDR: DELAYED_RELEASE_CAPSULE | ORAL | 1 refills | Status: DC

## 2022-01-27 MED ORDER — HYDROCHLOROTHIAZIDE 12.5 MG PO CAPS: ORAL_CAPSULE | ORAL | 3 refills | Status: DC

## 2022-01-27 NOTE — Progress Notes (Unsigned)
Subjective:  Patient ID: Justin Farley, male    DOB: [birth year]  Age: 61 y.o. MRN: [id number]  CC:  Chief Complaint  Patient presents with   Hypertension    Pt states he didn't take his bp meds this morning    Gastroesophageal Reflux    Pt states all is going well    HPI HRITHIK Farley presents for   Hypertension: Hydrochlorothiazide 12.5 mg daily, controlled at home - dtr who is a CNA has been measuring and normal range, unknown exact readings.has not taken meds yet today.  BP Readings from Last 3 Encounters:  01/27/22 138/88  07/17/21 128/74  06/16/21 (!) 134/92   Lab Results  Component Value Date   CREATININE 1.15 07/17/2021   Erectile dysfunction Sildenafil 100 mg 1/2-1 as needed. 1/2 pill working well, no HA/flushing. No vision/hearing changes. No CP with exertion.   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 Working well. Only uses 1x/month.   Prediabetes: Weight down 5 pounds from last visit.  Has been exercising, watching diet. Doing well. No urinary frequency or blurry vision.  Lab Results  Component Value Date   HGBA1C 5.9 07/17/2021   Wt Readings from Last 3 Encounters:  01/27/22 162 lb 6.4 oz (73.7 kg)  07/17/21 167 lb 3.2 oz (75.8 kg)  06/16/21 160 lb (72.6 kg)    GERD: Omeprazole 40 mg daily.  Intermittent dosing was ineffective as breakthrough symptoms. Still working well on current dosing. No abd pain, no blood in stool    HM: Flu vaccine today.   History Patient Active Problem List   Diagnosis Date Noted   Internal hemorrhoids with grade 2 prolapse 11/28/2014   HTN (hypertension) 04/19/2013   GERD (gastroesophageal reflux disease) 04/19/2013   Past Medical History:  Diagnosis Date   Allergy    Colon polyps    Esophagitis    Gastritis    GERD (gastroesophageal reflux disease)    Hypertension    Internal hemorrhoids with grade 2 prolapse 11/28/2014   Past Surgical History:  Procedure Laterality Date    COLONOSCOPY     ESOPHAGOGASTRODUODENOSCOPY     HEMORRHOID BANDING     WISDOM TOOTH EXTRACTION  2009   Allergies  Allergen Reactions   Lamisil [Terbinafine]     Sharp stomach pain   Shellfish Allergy    Prior to Admission medications   Medication Sig Start Date End Date Taking? Authorizing Provider  albuterol (VENTOLIN HFA) 108 (90 Base) MCG/ACT inhaler Inhale 2 puffs into the lungs every 4 (four) hours as needed for wheezing. 10/03/19  Yes Wendie Agreste, MD  betamethasone dipropionate 0.05 % cream Apply topically daily. 07/17/21  Yes Wendie Agreste, MD  hydrochlorothiazide (MICROZIDE) 12.5 MG capsule TAKE 1 CAPSULE EVERY MORNING. 01/16/21  Yes Wendie Agreste, MD  omeprazole (PRILOSEC) 40 MG capsule TAKE 1 CAPSULE (40 MG TOTAL) BY MOUTH DAILY.as needed 07/17/21  Yes Wendie Agreste, MD  sildenafil (VIAGRA) 100 MG tablet Take 0.5-1 tablets (50-100 mg total) by mouth daily as needed for erectile dysfunction. 07/17/21  Yes Wendie Agreste, MD   Social History   Socioeconomic History   Marital status: Married    Spouse name: Not on file   Number of children: 4   Years of education: Not on file   Highest education level: Not on file  Occupational History   Occupation: Freight forwarder   Occupation: supervisor    Comment: [employer]  Tobacco Use   Smoking status: Never  Smokeless tobacco: Never  Vaping Use   Vaping Use: Never used  Substance and Sexual Activity   Alcohol use: Yes    Comment: special occasions   Drug use: No   Sexual activity: Yes    Birth control/protection: Condom  Other Topics Concern   Not on file  Social History Narrative   Single. Education: [employer]. Exercise: 3 days a week for 2 hours.   Social Determinants of Health   Financial Resource Strain: Not on file  Food Insecurity: Not on file  Transportation Needs: Not on file  Physical Activity: Not on file  Stress: Not on file  Social Connections: Not on file  Intimate Partner  Violence: Not on file    Review of Systems  Constitutional:  Negative for fatigue and unexpected weight change.  Eyes:  Negative for visual disturbance.  Respiratory:  Negative for cough, chest tightness and shortness of breath.   Cardiovascular:  Negative for chest pain, palpitations and leg swelling.  Gastrointestinal:  Negative for abdominal pain and blood in stool.  Neurological:  Negative for dizziness, light-headedness and headaches.     Objective:   Vitals:   01/27/22 0946 01/27/22 0952  BP: 138/88 138/88  Pulse: 77   Temp: 98.2 F (36.8 C)   SpO2: 97%   Weight: 162 lb 6.4 oz (73.7 kg)   Height: '5\' 4"'$  (1.626 m)      Physical Exam Vitals reviewed.  Constitutional:      Appearance: He is well-developed.  HENT:     Head: Normocephalic and atraumatic.  Neck:     Vascular: No carotid bruit or JVD.  Cardiovascular:     Rate and Rhythm: Normal rate and regular rhythm.     Heart sounds: Normal heart sounds. No murmur heard. Pulmonary:     Effort: Pulmonary effort is normal.     Breath sounds: Normal breath sounds. No rales.  Musculoskeletal:     Right lower leg: No edema.     Left lower leg: No edema.  Skin:    General: Skin is warm and dry.  Neurological:     Mental Status: He is alert and oriented to person, place, and time.  Psychiatric:        Mood and Affect: Mood normal.        Assessment & Plan:  Justin Farley is a 61 y.o. male . Essential hypertension - Plan: hydrochlorothiazide (MICROZIDE) 12.5 MG capsule, Comprehensive metabolic panel, Lipid panel  -Borderline control, has not taken meds today, reports stable home readings.  Handout given on blood pressure management with RTC precautions, continue same dose HCTZ for now.  Gastroesophageal reflux disease with esophagitis, unspecified whether hemorrhage - Plan: omeprazole (PRILOSEC) 40 MG capsule  -Stable with daily dosing of omeprazole, unable to tolerate intermittent dosing previously.  No  changes for now  Needs flu shot - Plan: Flu Vaccine QUAD 47moIM (Fluarix, Fluzone & Alfiuria Quad PF)  Prediabetes - Plan: Hemoglobin A1c  -Commended on weight loss, continue to monitor.  Erectile dysfunction  -Continue Viagra- use lowest effective dose. Side effects discussed (including but not limited to headache/flushing, blue discoloration of vision, possible vascular steal and risk of cardiac effects if underlying unknown coronary artery disease, and permanent sensorineural hearing loss). Understanding expressed.   Meds ordered this encounter  Medications   hydrochlorothiazide (MICROZIDE) 12.5 MG capsule    Sig: TAKE 1 CAPSULE EVERY MORNING.    Dispense:  90 capsule    Refill:  3   omeprazole (  PRILOSEC) 40 MG capsule    Sig: TAKE 1 CAPSULE (40 MG TOTAL) BY MOUTH DAILY.as needed    Dispense:  90 capsule    Refill:  1   Patient Instructions  Keep up the good work with weight loss. No med changes for now.  Take care! Managing Your Hypertension Hypertension, also called high blood pressure, is when the force of the blood pressing against the walls of the arteries is too strong. Arteries are blood vessels that carry blood from your heart throughout your body. Hypertension forces the heart to work harder to pump blood and may cause the arteries to become narrow or stiff. Understanding blood pressure readings A blood pressure reading includes a higher number over a lower number: The first, or top, number is called the systolic pressure. It is a measure of the pressure in your arteries as your heart beats. The second, or bottom number, is called the diastolic pressure. It is a measure of the pressure in your arteries as the heart relaxes. For most people, a normal blood pressure is below 120/80. Your personal target blood pressure may vary depending on your medical conditions, your age, and other factors. Blood pressure is classified into four stages. Based on your blood pressure  reading, your health care provider may use the following stages to determine what type of treatment you need, if any. Systolic pressure and diastolic pressure are measured in a unit called millimeters of mercury (mmHg). Normal Systolic pressure: below 267. Diastolic pressure: below 80. Elevated Systolic pressure: 124-580. Diastolic pressure: below 80. Hypertension stage 1 Systolic pressure: 998-338. Diastolic pressure: 25-05. Hypertension stage 2 Systolic pressure: 397 or above. Diastolic pressure: 90 or above. How can this condition affect me? Managing your hypertension is very important. Over time, hypertension can damage the arteries and decrease blood flow to parts of the body, including the brain, heart, and kidneys. Having untreated or uncontrolled hypertension can lead to: A heart attack. A stroke. A weakened blood vessel (aneurysm). Heart failure. Kidney damage. Eye damage. Memory and concentration problems. Vascular dementia. What actions can I take to manage this condition? Hypertension can be managed by making lifestyle changes and possibly by taking medicines. Your health care provider will help you make a plan to bring your blood pressure within a normal range. You may be referred for counseling on a healthy diet and physical activity. Nutrition  Eat a diet that is high in fiber and potassium, and low in salt (sodium), added sugar, and fat. An example eating plan is called the DASH diet. DASH stands for Dietary Approaches to Stop Hypertension. To eat this way: Eat plenty of fresh fruits and vegetables. Try to fill one-half of your plate at each meal with fruits and vegetables. Eat whole grains, such as whole-wheat pasta, brown rice, or whole-grain bread. Fill about one-fourth of your plate with whole grains. Eat low-fat dairy products. Avoid fatty cuts of meat, processed or cured meats, and poultry with skin. Fill about one-fourth of your plate with lean proteins such as  fish, chicken without skin, beans, eggs, and tofu. Avoid pre-made and processed foods. These tend to be higher in sodium, added sugar, and fat. Reduce your daily sodium intake. Many people with hypertension should eat less than 1,500 mg of sodium a day. Lifestyle  Work with your health care provider to maintain a healthy body weight or to lose weight. Ask what an ideal weight is for you. Get at least 30 minutes of exercise that causes your heart  to beat faster (aerobic exercise) most days of the week. Activities may include walking, swimming, or biking. Include exercise to strengthen your muscles (resistance exercise), such as weight lifting, as part of your weekly exercise routine. Try to do these types of exercises for 30 minutes at least 3 days a week. Do not use any products that contain nicotine or tobacco. These products include cigarettes, chewing tobacco, and vaping devices, such as e-cigarettes. If you need help quitting, ask your health care provider. Control any long-term (chronic) conditions you have, such as high cholesterol or diabetes. Identify your sources of stress and find ways to manage stress. This may include meditation, deep breathing, or making time for fun activities. Alcohol use Do not drink alcohol if: Your health care provider tells you not to drink. You are pregnant, may be pregnant, or are planning to become pregnant. If you drink alcohol: Limit how much you have to: 0-1 drink a day for women. 0-2 drinks a day for men. Know how much alcohol is in your drink. In the [place] one drink equals one 12 oz bottle of beer (355 mL), one 5 oz glass of wine (148 mL), or one 1 oz glass of hard liquor (44 mL). Medicines Your health care provider may prescribe medicine if lifestyle changes are not enough to get your blood pressure under control and if: Your systolic blood pressure is 130 or higher. Your diastolic blood pressure is 80 or higher. Take medicines only as told by  your health care provider. Follow the directions carefully. Blood pressure medicines must be taken as told by your health care provider. The medicine does not work as well when you skip doses. Skipping doses also puts you at risk for problems. Monitoring Before you monitor your blood pressure: Do not smoke, drink caffeinated beverages, or exercise within 30 minutes before taking a measurement. Use the bathroom and empty your bladder (urinate). Sit quietly for at least 5 minutes before taking measurements. Monitor your blood pressure at home as told by your health care provider. To do this: Sit with your back straight and supported. Place your feet flat on the floor. Do not cross your legs. Support your arm on a flat surface, such as a table. Make sure your upper arm is at heart level. Each time you measure, take two or three readings one minute apart and record the results. You may also need to have your blood pressure checked regularly by your health care provider. General information Talk with your health care provider about your diet, exercise habits, and other lifestyle factors that may be contributing to hypertension. Review all the medicines you take with your health care provider because there may be side effects or interactions. Keep all follow-up visits. Your health care provider can help you create and adjust your plan for managing your high blood pressure. Where to find more information National Heart, Lung, and Blood Institute: https://wilson-eaton.com/ American Heart Association: www.heart.org Contact a health care provider if: You think you are having a reaction to medicines you have taken. You have repeated (recurrent) headaches. You feel dizzy. You have swelling in your ankles. You have trouble with your vision. Get help right away if: You develop a severe headache or confusion. You have unusual weakness or numbness, or you feel faint. You have severe pain in your chest or  abdomen. You vomit repeatedly. You have trouble breathing. These symptoms may be an emergency. Get help right away. Call 911. Do not wait to see if the  symptoms will go away. Do not drive yourself to the hospital. Summary Hypertension is when the force of blood pumping through your arteries is too strong. If this condition is not controlled, it may put you at risk for serious complications. Your personal target blood pressure may vary depending on your medical conditions, your age, and other factors. For most people, a normal blood pressure is less than 120/80. Hypertension is managed by lifestyle changes, medicines, or both. Lifestyle changes to help manage hypertension include losing weight, eating a healthy, low-sodium diet, exercising more, stopping smoking, and limiting alcohol. This information is not intended to replace advice given to you by your health care provider. Make sure you discuss any questions you have with your health care provider. Document Revised: 01/31/2021 Document Reviewed: 01/31/2021 Elsevier Patient Education  [place],   [name], MD [facility] 01/27/22 10:30 AM

## 2022-01-27 NOTE — Patient Instructions (Addendum)
Keep up the good work with weight loss. No med changes for now.  Take care! Managing Your Hypertension Hypertension, also called high blood pressure, is when the force of the blood pressing against the walls of the arteries is too strong. Arteries are blood vessels that carry blood from your heart throughout your body. Hypertension forces the heart to work harder to pump blood and may cause the arteries to become narrow or stiff. Understanding blood pressure readings A blood pressure reading includes a higher number over a lower number: The first, or top, number is called the systolic pressure. It is a measure of the pressure in your arteries as your heart beats. The second, or bottom number, is called the diastolic pressure. It is a measure of the pressure in your arteries as the heart relaxes. For most people, a normal blood pressure is below 120/80. Your personal target blood pressure may vary depending on your medical conditions, your age, and other factors. Blood pressure is classified into four stages. Based on your blood pressure reading, your health care provider may use the following stages to determine what type of treatment you need, if any. Systolic pressure and diastolic pressure are measured in a unit called millimeters of mercury (mmHg). Normal Systolic pressure: below 354. Diastolic pressure: below 80. Elevated Systolic pressure: 562-563. Diastolic pressure: below 80. Hypertension stage 1 Systolic pressure: 893-734. Diastolic pressure: 28-76. Hypertension stage 2 Systolic pressure: 811 or above. Diastolic pressure: 90 or above. How can this condition affect me? Managing your hypertension is very important. Over time, hypertension can damage the arteries and decrease blood flow to parts of the body, including the brain, heart, and kidneys. Having untreated or uncontrolled hypertension can lead to: A heart attack. A stroke. A weakened blood vessel (aneurysm). Heart  failure. Kidney damage. Eye damage. Memory and concentration problems. Vascular dementia. What actions can I take to manage this condition? Hypertension can be managed by making lifestyle changes and possibly by taking medicines. Your health care provider will help you make a plan to bring your blood pressure within a normal range. You may be referred for counseling on a healthy diet and physical activity. Nutrition  Eat a diet that is high in fiber and potassium, and low in salt (sodium), added sugar, and fat. An example eating plan is called the DASH diet. DASH stands for Dietary Approaches to Stop Hypertension. To eat this way: Eat plenty of fresh fruits and vegetables. Try to fill one-half of your plate at each meal with fruits and vegetables. Eat whole grains, such as whole-wheat pasta, brown rice, or whole-grain bread. Fill about one-fourth of your plate with whole grains. Eat low-fat dairy products. Avoid fatty cuts of meat, processed or cured meats, and poultry with skin. Fill about one-fourth of your plate with lean proteins such as fish, chicken without skin, beans, eggs, and tofu. Avoid pre-made and processed foods. These tend to be higher in sodium, added sugar, and fat. Reduce your daily sodium intake. Many people with hypertension should eat less than 1,500 mg of sodium a day. Lifestyle  Work with your health care provider to maintain a healthy body weight or to lose weight. Ask what an ideal weight is for you. Get at least 30 minutes of exercise that causes your heart to beat faster (aerobic exercise) most days of the week. Activities may include walking, swimming, or biking. Include exercise to strengthen your muscles (resistance exercise), such as weight lifting, as part of your weekly exercise routine. Try  to do these types of exercises for 30 minutes at least 3 days a week. Do not use any products that contain nicotine or tobacco. These products include cigarettes, chewing  tobacco, and vaping devices, such as e-cigarettes. If you need help quitting, ask your health care provider. Control any long-term (chronic) conditions you have, such as high cholesterol or diabetes. Identify your sources of stress and find ways to manage stress. This may include meditation, deep breathing, or making time for fun activities. Alcohol use Do not drink alcohol if: Your health care provider tells you not to drink. You are pregnant, may be pregnant, or are planning to become pregnant. If you drink alcohol: Limit how much you have to: 0-1 drink a day for women. 0-2 drinks a day for men. Know how much alcohol is in your drink. In the [place] one drink equals one 12 oz bottle of beer (355 mL), one 5 oz glass of wine (148 mL), or one 1 oz glass of hard liquor (44 mL). Medicines Your health care provider may prescribe medicine if lifestyle changes are not enough to get your blood pressure under control and if: Your systolic blood pressure is 130 or higher. Your diastolic blood pressure is 80 or higher. Take medicines only as told by your health care provider. Follow the directions carefully. Blood pressure medicines must be taken as told by your health care provider. The medicine does not work as well when you skip doses. Skipping doses also puts you at risk for problems. Monitoring Before you monitor your blood pressure: Do not smoke, drink caffeinated beverages, or exercise within 30 minutes before taking a measurement. Use the bathroom and empty your bladder (urinate). Sit quietly for at least 5 minutes before taking measurements. Monitor your blood pressure at home as told by your health care provider. To do this: Sit with your back straight and supported. Place your feet flat on the floor. Do not cross your legs. Support your arm on a flat surface, such as a table. Make sure your upper arm is at heart level. Each time you measure, take two or three readings one minute apart and  record the results. You may also need to have your blood pressure checked regularly by your health care provider. General information Talk with your health care provider about your diet, exercise habits, and other lifestyle factors that may be contributing to hypertension. Review all the medicines you take with your health care provider because there may be side effects or interactions. Keep all follow-up visits. Your health care provider can help you create and adjust your plan for managing your high blood pressure. Where to find more information National Heart, Lung, and Blood Institute: https://wilson-eaton.com/ American Heart Association: www.heart.org Contact a health care provider if: You think you are having a reaction to medicines you have taken. You have repeated (recurrent) headaches. You feel dizzy. You have swelling in your ankles. You have trouble with your vision. Get help right away if: You develop a severe headache or confusion. You have unusual weakness or numbness, or you feel faint. You have severe pain in your chest or abdomen. You vomit repeatedly. You have trouble breathing. These symptoms may be an emergency. Get help right away. Call 911. Do not wait to see if the symptoms will go away. Do not drive yourself to the hospital. Summary Hypertension is when the force of blood pumping through your arteries is too strong. If this condition is not controlled, it may put you at  risk for serious complications. Your personal target blood pressure may vary depending on your medical conditions, your age, and other factors. For most people, a normal blood pressure is less than 120/80. Hypertension is managed by lifestyle changes, medicines, or both. Lifestyle changes to help manage hypertension include losing weight, eating a healthy, low-sodium diet, exercising more, stopping smoking, and limiting alcohol. This information is not intended to replace advice given to you by your health  care provider. Make sure you discuss any questions you have with your health care provider. Document Revised: 01/31/2021 Document Reviewed: 01/31/2021 Elsevier Patient Education  [place].

## 2022-05-16 ENCOUNTER — Ambulatory Visit: Payer: 59 | Admitting: Family Medicine

## 2022-05-16 ENCOUNTER — Encounter: Payer: Self-pay | Admitting: Family Medicine

## 2022-05-16 VITALS — BP 132/80 | HR 73 | Temp 98.7°F | Ht 64.0 in | Wt 165.6 lb

## 2022-05-16 DIAGNOSIS — Z3009 Encounter for other general counseling and advice on contraception: Secondary | ICD-10-CM | POA: Diagnosis not present

## 2022-05-16 DIAGNOSIS — D2230 Melanocytic nevi of unspecified part of face: Secondary | ICD-10-CM

## 2022-05-16 NOTE — Progress Notes (Signed)
Subjective:  Patient ID: Justin Farley, male    DOB: [birth year]  Age: 61 y.o. MRN: [id number]  CC:  Chief Complaint  Patient presents with   Nevus    Pt has a mole on his face on the left side, pt states it has gotten larger in size    Sterilization    Pt wants to talk about getting a vasectomy    HPI DEJION GRILLO presents for   L face lesion: Mole on left face- lateral to nose. Been present for years, enlarging past month.  No pain/itching, no bleeding. No personal or FH of skin CA, no treatments.   Contraceptive counseling, family-planning Would like to be referred for vasectomy. Not planning on more children.   History Patient Active Problem List   Diagnosis Date Noted   Internal hemorrhoids with grade 2 prolapse 11/28/2014   HTN (hypertension) 04/19/2013   GERD (gastroesophageal reflux disease) 04/19/2013   Past Medical History:  Diagnosis Date   Allergy    Colon polyps    Esophagitis    Gastritis    GERD (gastroesophageal reflux disease)    Hypertension    Internal hemorrhoids with grade 2 prolapse 11/28/2014   Past Surgical History:  Procedure Laterality Date   COLONOSCOPY     ESOPHAGOGASTRODUODENOSCOPY     HEMORRHOID BANDING     WISDOM TOOTH EXTRACTION  2009   Allergies  Allergen Reactions   Lamisil [Terbinafine]     Sharp stomach pain   Shellfish Allergy    Prior to Admission medications   Medication Sig Start Date End Date Taking? Authorizing Provider  albuterol (VENTOLIN HFA) 108 (90 Base) MCG/ACT inhaler Inhale 2 puffs into the lungs every 4 (four) hours as needed for wheezing. 10/03/19  Yes Wendie Agreste, MD  betamethasone dipropionate 0.05 % cream Apply topically daily. 07/17/21  Yes Wendie Agreste, MD  hydrochlorothiazide (MICROZIDE) 12.5 MG capsule TAKE 1 CAPSULE EVERY MORNING. 01/27/22  Yes Wendie Agreste, MD  omeprazole (PRILOSEC) 40 MG capsule TAKE 1 CAPSULE (40 MG TOTAL) BY MOUTH DAILY.as needed 01/27/22  Yes Wendie Agreste, MD  sildenafil (VIAGRA) 100 MG tablet Take 0.5-1 tablets (50-100 mg total) by mouth daily as needed for erectile dysfunction. 07/17/21  Yes Wendie Agreste, MD   Social History   Socioeconomic History   Marital status: Married    Spouse name: Not on file   Number of children: 4   Years of education: Not on file   Highest education level: Not on file  Occupational History   Occupation: Freight forwarder   Occupation: supervisor    Comment: [employer]  Tobacco Use   Smoking status: Never   Smokeless tobacco: Never  Vaping Use   Vaping Use: Never used  Substance and Sexual Activity   Alcohol use: Yes    Comment: special occasions   Drug use: No   Sexual activity: Yes    Birth control/protection: Condom  Other Topics Concern   Not on file  Social History Narrative   Single. Education: [employer]. Exercise: 3 days a week for 2 hours.   Social Determinants of Health   Financial Resource Strain: Not on file  Food Insecurity: Not on file  Transportation Needs: Not on file  Physical Activity: Not on file  Stress: Not on file  Social Connections: Not on file  Intimate Partner Violence: Not on file    Review of Systems  Per HPI Objective:   Vitals:   05/16/22  0921  BP: 132/80  Pulse: 73  Temp: 98.7 F (37.1 C)  SpO2: 98%  Weight: 165 lb 9.6 oz (75.1 kg)  Height: '5\' 4"'$  (1.626 m)     Physical Exam Constitutional:      General: He is not in acute distress.    Appearance: Normal appearance. He is well-developed.  HENT:     Head: Normocephalic and atraumatic.  Cardiovascular:     Rate and Rhythm: Normal rate.  Pulmonary:     Effort: Pulmonary effort is normal.  Skin:    Comments: 6 mm dark nevus left face, see photo.  No surrounding erythema, no discharge.  Neurological:     Mental Status: He is alert and oriented to person, place, and time.  Psychiatric:        Mood and Affect: Mood normal.         Assessment & Plan:  [name] is a 61  y.o. male . Nevus of face - Plan: Ambulatory referral to Dermatology  -Enlarging nevus, left face.  Referred to dermatology, has seen Dr. Ronnald Ramp previously, will try to request same.  Vasectomy evaluation - Plan: Ambulatory referral to Urology  -Referral placed to urology to discuss vasectomy.  No orders of the defined types were placed in this encounter.  Patient Instructions  Lesion on face does not look concerning but I would like dermatology to evaluate it if it is enlarging.  I placed that referral.  I have also referred you to urology to discuss vasectomy.  Let me know if there are questions and take care.    Signed,   [name], MD [place] 05/16/22 10:00 AM

## 2022-05-16 NOTE — Patient Instructions (Signed)
Lesion on face does not look concerning but I would like dermatology to evaluate it if it is enlarging.  I placed that referral.  I have also referred you to urology to discuss vasectomy.  Let me know if there are questions and take care.

## 2022-05-30 ENCOUNTER — Ambulatory Visit: Payer: 59 | Admitting: Urology

## 2022-05-30 ENCOUNTER — Encounter: Payer: Self-pay | Admitting: Urology

## 2022-05-30 VITALS — BP 142/89 | HR 90 | Ht 64.0 in | Wt 162.0 lb

## 2022-05-30 DIAGNOSIS — Z3009 Encounter for other general counseling and advice on contraception: Secondary | ICD-10-CM

## 2022-05-30 NOTE — Progress Notes (Signed)
Assessment: 1. Encounter for vasectomy assessment      Plan: He will call the office if he is interested in proceeding with vasectomy. Recommend that his wife discuss her fertility status with her OB/GYN  Chief Complaint:  Chief Complaint  Patient presents with   VAS Consult    History of Present Illness:  Justin Farley is a 61 y.o. male who is seen for vasectomy evaluation.   He is married with children.  His wife is 79 and may be peri or post menopausal.  No history of scrotal trauma or infection.     Past Medical History:  Past Medical History:  Diagnosis Date   Allergy    Colon polyps    Esophagitis    Gastritis    GERD (gastroesophageal reflux disease)    Hypertension    Internal hemorrhoids with grade 2 prolapse 11/28/2014    Past Surgical History:  Past Surgical History:  Procedure Laterality Date   COLONOSCOPY     ESOPHAGOGASTRODUODENOSCOPY     HEMORRHOID BANDING     WISDOM TOOTH EXTRACTION  2009    Allergies:  Allergies  Allergen Reactions   Lamisil [Terbinafine]     Sharp stomach pain   Shellfish Allergy     Family History:  Family History  Problem Relation Age of Onset   Hypertension Mother    Hypertension Sister    Diabetes Brother    Prostate cancer Maternal Uncle    Colon cancer Neg Hx    Esophageal cancer Neg Hx    Rectal cancer Neg Hx    Stomach cancer Neg Hx     Social History:  Social History   Tobacco Use   Smoking status: Never   Smokeless tobacco: Never  Vaping Use   Vaping Use: Never used  Substance Use Topics   Alcohol use: Yes    Comment: special occasions   Drug use: No    Review of symptoms:  Constitutional:  Negative for unexplained weight loss, night sweats, fever, chills ENT:  Negative for nose bleeds, sinus pain, painful swallowing CV:  Negative for chest pain, shortness of breath, exercise intolerance, palpitations, loss of consciousness Resp:  Negative for cough, wheezing, shortness of breath GI:   Negative for nausea, vomiting, diarrhea, bloody stools GU:  Positives noted in HPI; otherwise negative for gross hematuria, dysuria, urinary incontinence Neuro:  Negative for seizures, poor balance, limb weakness, slurred speech Psych:  Negative for lack of energy, depression, anxiety Endocrine:  Negative for polydipsia, polyuria, symptoms of hypoglycemia (dizziness, hunger, sweating) Hematologic:  Negative for anemia, purpura, petechia, prolonged or excessive bleeding, use of anticoagulants  Allergic:  Negative for difficulty breathing or choking as a result of exposure to anything; no shellfish allergy; no allergic response (rash/itch) to materials, foods  Physical exam: BP (!) 142/89   Pulse 90   Ht '5\' 4"'$  (1.626 m)   Wt 162 lb (73.5 kg)   BMI 27.81 kg/m  GENERAL APPEARANCE:  Well appearing, well developed, well nourished, NAD HEENT:  Atraumatic, normocephalic, oropharynx clear NECK:  Supple without lymphadenopathy or thyromegaly ABDOMEN:  Soft, non-tender, no masses EXTREMITIES:  Moves all extremities well, without clubbing, cyanosis, or edema NEUROLOGIC:  Alert and oriented x 3, normal gait, CN II-XII grossly intact MENTAL STATUS:  appropriate BACK:  Non-tender to palpation, No CVAT SKIN:  Warm, dry, and intact GU: Penis:  circumcised Meatus: Normal Scrotum: vas palpated bilaterally Testis: normal without masses bilateral Epididymis: normal  Results: None  VASECTOMY CONSULTATION  [name]  N Kempton presents for vasectomy consultation today.  He is a 61 y.o. male, Married with children.  He and his wife have discussed the issues regarding long-term fertility and are comfortable with this decision.  He presents for consideration for vasectomy.  I discussed the issues in detail with him today and he expressed no reservations.  As to the procedure, no scalpel technique vasectomy is explained and reviewed in detail.  Generalized risks including but not limited to bleeding,  infection, orchalgia, testicular atrophy, epididymitis, scrotal hematoma, and chronic pain are discussed.   Additionally, he understands that the possibility of vas recanalization following vasectomy is possible although rare.  Most importantly, the patient understands that he is not sterile initially and will need a semen analysis check to confirm sterility such that no sperm are seen.  He is advised to avoid ejaculation for 10 days following the procedure.  The initial semen analysis will be checked in approximately 12 weeks and in some patients, several months may be required for clearance of all sperm.  He reports a clear understanding of the need for continued birth control until sterility is confirmed.  Otherwise, general issues regarding local anesthesia, prep, alprazolam are discussed and he reports a clear understanding.

## 2022-06-03 ENCOUNTER — Ambulatory Visit: Payer: 59 | Admitting: Urology

## 2022-06-11 IMAGING — DX DG CHEST 2V
2 series · 2 of 2 positions shown · non-contrast
Comparison: Chest radiograph 03/25/2010

CLINICAL DATA: recent fleeting chest pain.

EXAM:
CHEST - 2 VIEW

[chest pa]
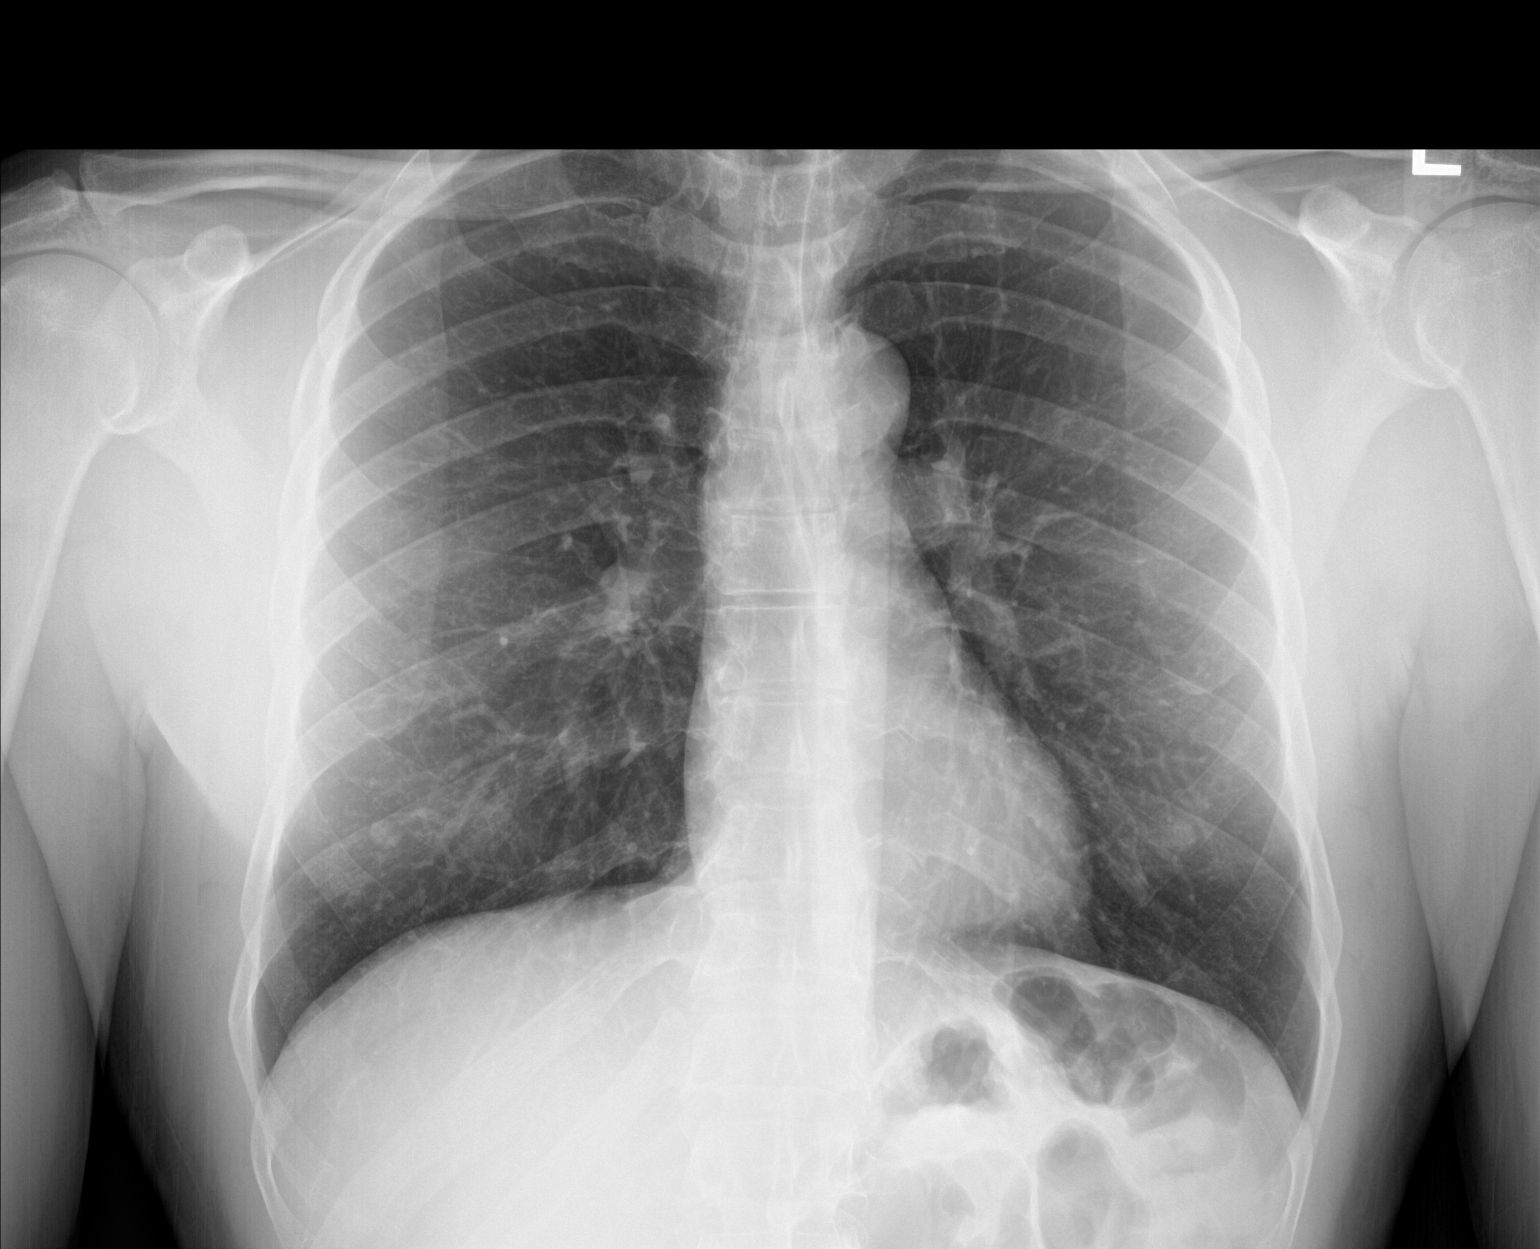

[chest lat]
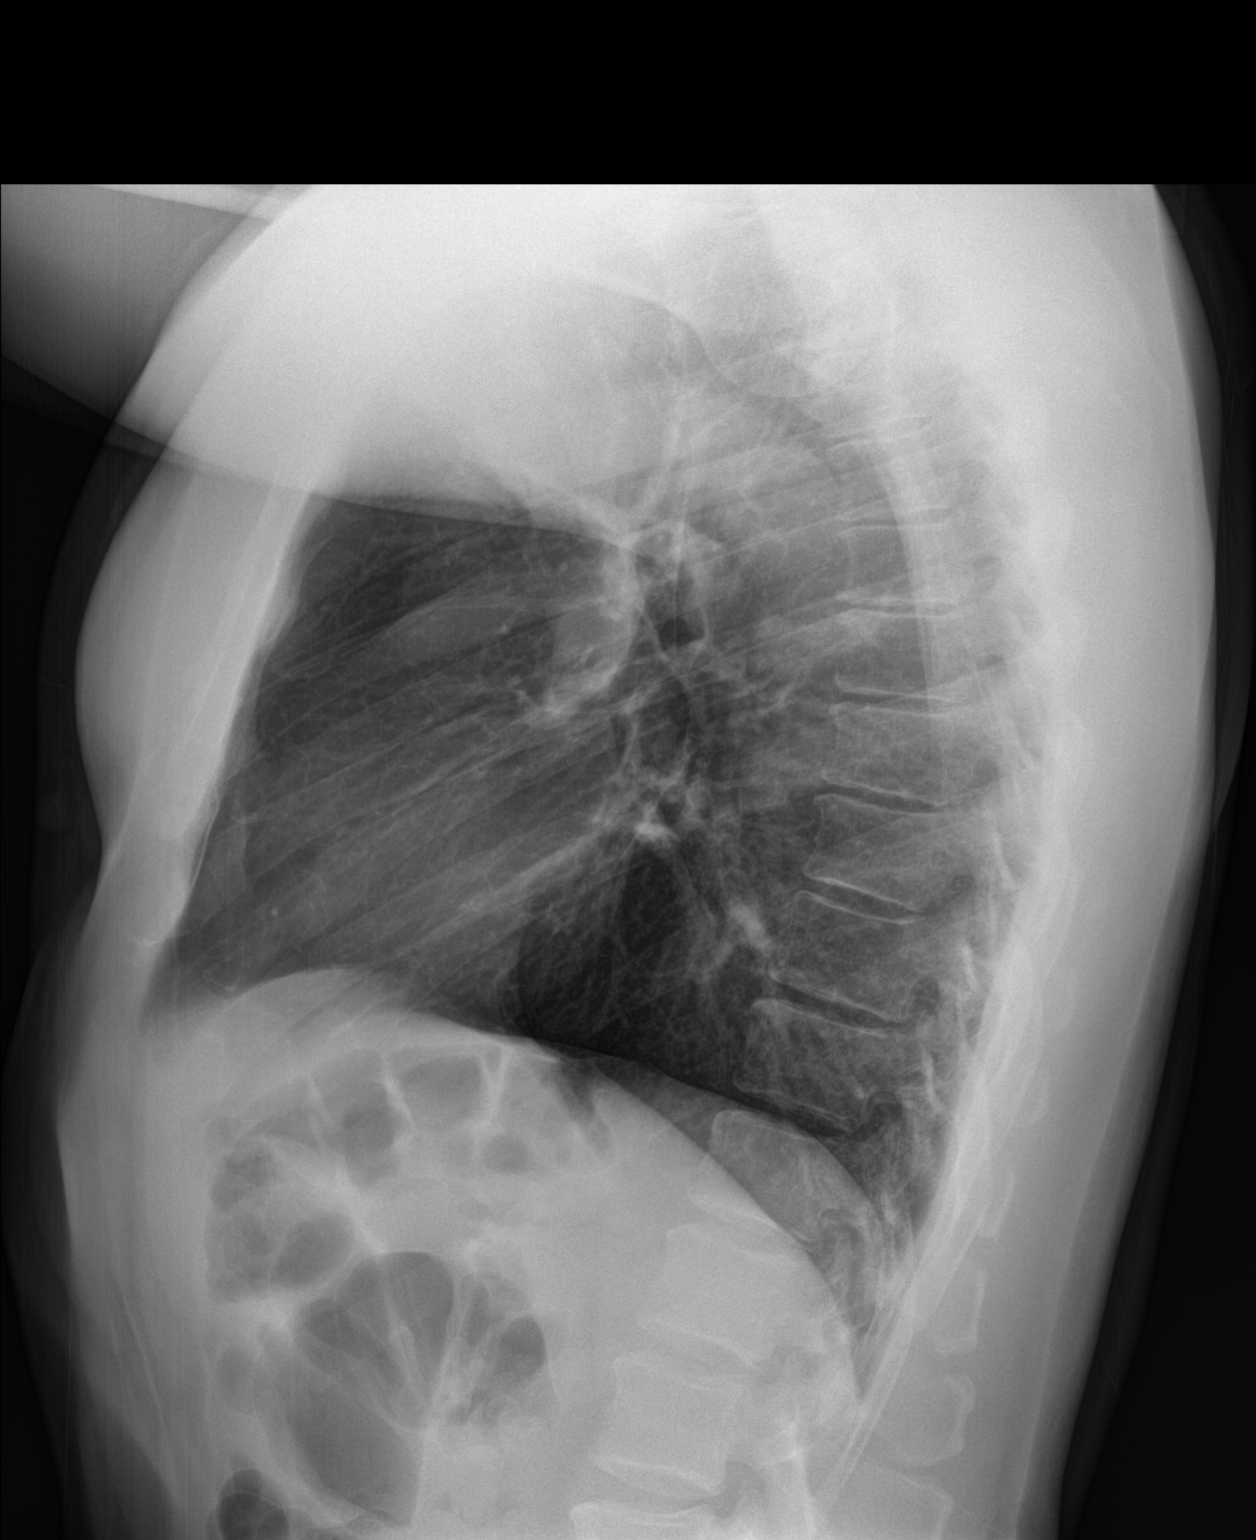

[2 of 2 positions shown; findings below may reference images not displayed]

FINDINGS: The heart size and mediastinal contours are within normal limits.
The lungs are clear. No pneumothorax or pleural effusion. The
visualized skeletal structures are unremarkable.
IMPRESSION: No active cardiopulmonary disease.

## 2022-06-24 ENCOUNTER — Telehealth: Payer: Self-pay | Admitting: Urology

## 2022-08-06 ENCOUNTER — Encounter: Payer: Self-pay | Admitting: Family Medicine

## 2022-08-06 ENCOUNTER — Ambulatory Visit (INDEPENDENT_AMBULATORY_CARE_PROVIDER_SITE_OTHER): Payer: 59 | Admitting: Family Medicine

## 2022-08-06 VITALS — BP 130/78 | HR 69 | Temp 98.5°F | Ht 64.0 in | Wt 161.4 lb

## 2022-08-06 DIAGNOSIS — Z125 Encounter for screening for malignant neoplasm of prostate: Secondary | ICD-10-CM | POA: Diagnosis not present

## 2022-08-06 DIAGNOSIS — Z130 Encounter for screening for diseases of the blood and blood-forming organs and certain disorders involving the immune mechanism: Secondary | ICD-10-CM | POA: Diagnosis not present

## 2022-08-06 DIAGNOSIS — N529 Male erectile dysfunction, unspecified: Secondary | ICD-10-CM | POA: Diagnosis not present

## 2022-08-06 DIAGNOSIS — Z13220 Encounter for screening for lipoid disorders: Secondary | ICD-10-CM

## 2022-08-06 DIAGNOSIS — I10 Essential (primary) hypertension: Secondary | ICD-10-CM

## 2022-08-06 DIAGNOSIS — Z0000 Encounter for general adult medical examination without abnormal findings: Secondary | ICD-10-CM | POA: Diagnosis not present

## 2022-08-06 DIAGNOSIS — Z1329 Encounter for screening for other suspected endocrine disorder: Secondary | ICD-10-CM

## 2022-08-06 DIAGNOSIS — R7303 Prediabetes: Secondary | ICD-10-CM

## 2022-08-06 DIAGNOSIS — K2100 Gastro-esophageal reflux disease with esophagitis, without bleeding: Secondary | ICD-10-CM | POA: Diagnosis not present

## 2022-08-06 LAB — CBC WITH DIFFERENTIAL/PLATELET
Basophils Absolute: 0 10*3/uL (ref 0.0–0.1)
Basophils Relative: 0.7 % (ref 0.0–3.0)
Eosinophils Absolute: 0 10*3/uL (ref 0.0–0.7)
Eosinophils Relative: 0.6 % (ref 0.0–5.0)
HCT: 46.5 % (ref 39.0–52.0)
Hemoglobin: 15.3 g/dL (ref 13.0–17.0)
Lymphocytes Relative: 48.7 % — ABNORMAL HIGH (ref 12.0–46.0)
Lymphs Abs: 1.9 10*3/uL (ref 0.7–4.0)
MCHC: 32.8 g/dL (ref 30.0–36.0)
MCV: 89.6 fl (ref 78.0–100.0)
Monocytes Absolute: 0.3 10*3/uL (ref 0.1–1.0)
Monocytes Relative: 6.5 % (ref 3.0–12.0)
Neutro Abs: 1.7 10*3/uL (ref 1.4–7.7)
Neutrophils Relative %: 43.5 % (ref 43.0–77.0)
Platelets: 195 10*3/uL (ref 150.0–400.0)
RBC: 5.2 Mil/uL (ref 4.22–5.81)
RDW: 14.4 % (ref 11.5–15.5)
WBC: 3.9 10*3/uL — ABNORMAL LOW (ref 4.0–10.5)

## 2022-08-06 LAB — COMPREHENSIVE METABOLIC PANEL
ALT: 26 U/L (ref 0–53)
AST: 23 U/L (ref 0–37)
Albumin: 4.5 g/dL (ref 3.5–5.2)
Alkaline Phosphatase: 30 U/L — ABNORMAL LOW (ref 39–117)
BUN: 10 mg/dL (ref 6–23)
CO2: 29 mEq/L (ref 19–32)
Calcium: 9.7 mg/dL (ref 8.4–10.5)
Chloride: 101 mEq/L (ref 96–112)
Creatinine, Ser: 1.01 mg/dL (ref 0.40–1.50)
GFR: 81.46 mL/min (ref >60.00)
Glucose, Bld: 85 mg/dL (ref 70–99)
Potassium: 4.4 mEq/L (ref 3.5–5.1)
Sodium: 139 mEq/L (ref 135–145)
Total Bilirubin: 1.2 mg/dL (ref 0.2–1.2)
Total Protein: 7.6 g/dL (ref 6.0–8.3)

## 2022-08-06 LAB — TSH: TSH: 1.59 u[IU]/mL (ref 0.35–5.50)

## 2022-08-06 LAB — LIPID PANEL
Cholesterol: 193 mg/dL (ref 0–200)
HDL: 60.5 mg/dL (ref >39.00)
LDL Cholesterol: 106 mg/dL — ABNORMAL HIGH (ref 0–99)
NonHDL: 132.8
Total CHOL/HDL Ratio: 3
Triglycerides: 135 mg/dL (ref 0.0–149.0)
VLDL: 27 mg/dL (ref 0.0–40.0)

## 2022-08-06 LAB — PSA: PSA: 4.38 ng/mL — ABNORMAL HIGH (ref 0.10–4.00)

## 2022-08-06 LAB — HEMOGLOBIN A1C: Hgb A1c MFr Bld: 5.9 % (ref 4.6–6.5)

## 2022-08-06 MED ORDER — OMEPRAZOLE 40 MG PO CPDR: DELAYED_RELEASE_CAPSULE | ORAL | 3 refills | Status: DC

## 2022-08-06 MED ORDER — SILDENAFIL CITRATE 100 MG PO TABS: 50.0000 mg | ORAL_TABLET | Freq: Every day | ORAL | 11 refills | Status: DC | PRN

## 2022-08-06 MED ORDER — HYDROCHLOROTHIAZIDE 12.5 MG PO CAPS: ORAL_CAPSULE | ORAL | 3 refills | Status: DC

## 2022-08-06 NOTE — Progress Notes (Signed)
Subjective:  Patient ID: Justin Farley, male    DOB: Sep 27, 1960  Age: 62 y.o. MRN: [id number]  CC:  Chief Complaint  Patient presents with   Annual Exam    Pt is fasting, doing well    HPI Justin Farley presents for Annual Exam No health, life changes.   Hypertension: Hydrochlorothiazide 12.5 mg daily.  Borderline control in August, December last year.  Home readings normal at that time. Recent home readings: 111/75 on home meter this morning. 115/74. No new med side effects.  BP Readings from Last 3 Encounters:  08/06/22 130/78  05/30/22 (!) 142/89  05/16/22 132/80   Lab Results  Component Value Date   CREATININE 1.04 01/27/2022   GERD Treated with omeprazole 40 mg daily, breakthrough symptoms with trial of intermittent dosing previously.  Erectile dysfunction Treated with sildenafil 50 to 100 mg as needed.  50 mg has been effective without headache/flushing, vision or hearing changes and denies any chest pain or dyspnea with exertion.  Prediabetes: Exercise, watching, no sugar beverages. No meds.  Lab Results  Component Value Date   HGBA1C 5.9 01/27/2022   Wt Readings from Last 3 Encounters:  08/06/22 161 lb 6.4 oz (73.2 kg)  05/30/22 162 lb (73.5 kg)  05/16/22 165 lb 9.6 oz (75.1 kg)  Body mass index is 27.7 kg/m.         08/06/2022    8:04 AM 05/16/2022    9:20 AM 01/27/2022    9:45 AM 07/17/2021    8:01 AM 01/16/2021   11:15 AM  Depression screen PHQ 2/9  Decreased Interest 0 0 0 0 0  Down, Depressed, Hopeless 0 0 0 0 0  PHQ - 2 Score 0 0 0 0 0  Altered sleeping 0 0 0 0   Tired, decreased energy 0 0 0 0   Change in appetite 0 0 0 0   Feeling bad or failure about yourself  0 0 0 0   Trouble concentrating 0 0 0 0   Moving slowly or fidgety/restless 0 0 0 0   Suicidal thoughts 0 0 0 0   PHQ-9 Score 0 0 0 0     Health Maintenance  Topic Date Due   COVID-19 Vaccine (6 - 2023-24 season) 01/31/2022   DTaP/Tdap/Td (2 - Td or Tdap) 03/28/2027    COLONOSCOPY (Pts 45-51yr Insurance coverage will need to be confirmed)  01/08/2031   INFLUENZA VACCINE  Completed   Hepatitis C Screening  Completed   HIV Screening  Completed   Zoster Vaccines- Shingrix  Completed   HPV VACCINES  Aged Out  Colonoscopy 01/07/2021, Few external, internal hemorrhoids, otherwise normal.  Repeat 10 years Derm: history of pseudofolliculitis, managed with betamethasone cream as needed, infrequent use. No recent use.  [facility]  Prostate: does  have family history of prostate cancer - uncle.  The natural history of prostate cancer and ongoing controversy regarding screening and potential treatment outcomes of prostate cancer has been discussed with the patient. The meaning of a false positive PSA and a false negative PSA has been discussed. He indicates understanding of the limitations of this screening test and wishes  to proceed with screening PSA testing. Lab Results  Component Value Date   PSA1 3.5 04/08/2019   PSA1 3.9 03/30/2018   PSA1 3.9 09/30/2017   PSA 3.97 07/17/2021   PSA 4.0 01/31/2016   PSA 2.66 01/30/2015    Immunization History  Administered Date(s) Administered   Influenza Inj Mdck Quad Pf 03/06/2020   Influenza, Quadrivalent, Recombinant, Inj, Pf 05/29/2021   Influenza,inj,Quad PF,6+ Mos 03/14/2013, 03/27/2017, 03/30/2018, 03/14/2019, 01/27/2022   Influenza-Unspecified 03/02/2014, 03/03/2015   PFIZER(Purple Top)SARS-COV-2 Vaccination 08/27/2019, 09/17/2019, 03/25/2020, 09/08/2020   Pfizer Covid-19 Vaccine Bivalent Booster 50yr & up 03/25/2021   Tdap 03/27/2017   Zoster Recombinat (Shingrix) 04/04/2019, 06/04/2019  [facility]   No results found. [facility] within the past year.  Dental:Within Last 6 months  Alcohol: Less than 1 drink per month  Tobacco: None  Exercise: 4 to 5 days/week for 1 to 1.5 hours  still. No new arthralgias or injuries.    History Patient Active Problem List   Diagnosis Date Noted   Internal hemorrhoids with grade 2 prolapse 11/28/2014   HTN (hypertension) 04/19/2013   GERD (gastroesophageal reflux disease) 04/19/2013   Past Medical History:  Diagnosis Date   Allergy    Colon polyps    Esophagitis    Gastritis    GERD (gastroesophageal reflux disease)    Hypertension    Internal hemorrhoids with grade 2 prolapse 11/28/2014   Past Surgical History:  Procedure Laterality Date   COLONOSCOPY     ESOPHAGOGASTRODUODENOSCOPY     HEMORRHOID BANDING     WISDOM TOOTH EXTRACTION  2009   Allergies  Allergen Reactions   Lamisil [Terbinafine]     Sharp stomach pain   Shellfish Allergy    Prior to Admission medications   Medication Sig Start Date End Date Taking? Authorizing Provider  albuterol (VENTOLIN HFA) 108 (90 Base) MCG/ACT inhaler Inhale 2 puffs into the lungs every 4 (four) hours as needed for wheezing. 10/03/19  Yes GWendie Agreste MD  hydrochlorothiazide (MICROZIDE) 12.5 MG capsule TAKE 1 CAPSULE EVERY MORNING. 01/27/22  Yes GWendie Agreste MD  omeprazole (PRILOSEC) 40 MG capsule TAKE 1 CAPSULE (40 MG TOTAL) BY MOUTH DAILY.as needed 01/27/22  Yes GWendie Agreste MD  sildenafil (VIAGRA) 100 MG tablet Take 0.5-1 tablets (50-100 mg total) by mouth daily as needed for erectile dysfunction. 07/17/21  Yes GWendie Agreste MD  betamethasone dipropionate 0.05 % cream Apply topically daily. Patient not taking: Reported on 08/06/2022 07/17/21   GWendie Agreste MD   Social History   Socioeconomic History   Marital status: Married    Spouse name: Not on file   Number of children: 4   Years of education: Not on file   Highest education level: Not on file  Occupational History   Occupation: FFreight forwarder  Occupation: supervisor    Comment: [employer]  Tobacco Use   Smoking status: Never   Smokeless tobacco: Never  Vaping Use   Vaping Use:  Never used  Substance and Sexual Activity   Alcohol use: Yes    Comment: special occasions   Drug use: No   Sexual activity: Yes    Birth control/protection: Condom  Other Topics Concern   Not on file  Social History Narrative   Single. Education: [employer] Exercise: 3 days a week for 2 hours.   Social Determinants of Health   Financial Resource Strain: Not on file  Food Insecurity: Not on file  Transportation Needs: Not on file  Physical Activity: Not on file  Stress: Not on file  Social Connections: Not on file  Intimate Partner Violence: Not on file    Review of Systems  13 point review of systems per  patient health survey noted.  Negative other than as indicated above or in HPI.   Objective:   Vitals:   08/06/22 0807  BP: 130/78  Pulse: 69  Temp: 98.5 F (36.9 C)  TempSrc: Temporal  SpO2: 99%  Weight: 161 lb 6.4 oz (73.2 kg)  Height: '5\' 4"'$  (1.626 m)     Physical Exam Vitals reviewed.  Constitutional:      Appearance: He is well-developed.  HENT:     Head: Normocephalic and atraumatic.     Right Ear: External ear normal.     Left Ear: External ear normal.  Eyes:     Conjunctiva/sclera: Conjunctivae normal.     Pupils: Pupils are equal, round, and reactive to light.  Neck:     Thyroid: No thyromegaly.  Cardiovascular:     Rate and Rhythm: Normal rate and regular rhythm.     Heart sounds: Normal heart sounds.  Pulmonary:     Effort: Pulmonary effort is normal. No respiratory distress.     Breath sounds: Normal breath sounds. No wheezing.  Abdominal:     General: There is no distension.     Palpations: Abdomen is soft.     Tenderness: There is no abdominal tenderness.  Musculoskeletal:        General: No tenderness. Normal range of motion.     Cervical back: Normal range of motion and neck supple.  Lymphadenopathy:     Cervical: No cervical adenopathy.  Skin:    General: Skin is warm and dry.  Neurological:     Mental Status: He is alert and  oriented to person, place, and time.     Deep Tendon Reflexes: Reflexes are normal and symmetric.  Psychiatric:        Behavior: Behavior normal.        Assessment & Plan:  REKO TRAFICANTE is a 62 y.o. male . Annual physical exam  - -anticipatory guidance as below in AVS, screening labs above. Health maintenance items as above in HPI discussed/recommended as applicable.   Erectile dysfunction, unspecified erectile dysfunction type - Plan: sildenafil (VIAGRA) 100 MG tablet  - viagra Rx given - use lowest effective dose. Side effects discussed (including but not limited to headache/flushing, blue discoloration of vision, possible vascular steal and risk of cardiac effects if underlying unknown coronary artery disease, and permanent sensorineural hearing loss). Understanding expressed.  Essential hypertension - Plan: CBC with Differential/Platelet, Comprehensive metabolic panel, hydrochlorothiazide (MICROZIDE) 12.5 MG capsule, TSH  -Stable on repeat testing and home readings.  Continue same dose meds.  Gastroesophageal reflux disease with esophagitis, unspecified whether hemorrhage - Plan: omeprazole (PRILOSEC) 40 MG capsule  -Current dose omeprazole.  Continue same.  Breakthrough symptoms with intermittent dosing.  Screening for thyroid disorder - Plan: TSH  Screening for prostate cancer - Plan: PSA  Screening for hyperlipidemia - Plan: Comprehensive metabolic panel, Lipid panel  Screening, anemia, deficiency, iron - Plan: CBC with Differential/Platelet  Prediabetes - Plan: Comprehensive metabolic panel, Lipid panel, Hemoglobin A1c  -Commended on exercise, continue healthy eating habits, check A1c, 70-monthfollow-up.  Meds ordered this encounter  Medications   sildenafil (VIAGRA) 100 MG tablet    Sig: Take 0.5-1 tablets (50-100 mg total) by mouth daily as needed for erectile dysfunction.    Dispense:  5 tablet    Refill:  11   hydrochlorothiazide (MICROZIDE) 12.5 MG capsule     Sig: TAKE 1 CAPSULE EVERY MORNING.    Dispense:  90 capsule    Refill:  3   omeprazole (PRILOSEC) 40 MG capsule    Sig: TAKE 1 CAPSULE (40 MG TOTAL) BY MOUTH DAILY.as needed    Dispense:  90 capsule    Refill:  3   Patient Instructions  Repeat blood pressure looks okay and based on home readings I do not think we need to make any blood pressure med changes at this time.  I will let you know if there are any concerns on the labs.  Follow-up in 6 months but if any questions prior please let me know.  Take care!  Preventive Care 72-64 Years Old, Male Preventive care refers to lifestyle choices and visits with your health care provider that can promote health and wellness. Preventive care visits are also called wellness exams. What can I expect for my preventive care visit? Counseling During your preventive care visit, your health care provider may ask about your: Medical history, including: Past medical problems. Family medical history. Current health, including: Emotional well-being. Home life and relationship well-being. Sexual activity. Lifestyle, including: Alcohol, nicotine or tobacco, and drug use. Access to firearms. Diet, exercise, and sleep habits. Safety issues such as seatbelt and bike helmet use. Sunscreen use. Work and work Statistician. Physical exam Your health care provider will check your: Height and weight. These may be used to calculate your BMI (body mass index). BMI is a measurement that tells if you are at a healthy weight. Waist circumference. This measures the distance around your waistline. This measurement also tells if you are at a healthy weight and may help predict your risk of certain diseases, such as type 2 diabetes and high blood pressure. Heart rate and blood pressure. Body temperature. Skin for abnormal spots. What immunizations do I need?  Vaccines are usually given at various ages, according to a schedule. Your health care provider will  recommend vaccines for you based on your age, medical history, and lifestyle or other factors, such as travel or where you work. What tests do I need? Screening Your health care provider may recommend screening tests for certain conditions. This may include: Lipid and cholesterol levels. Diabetes screening. This is done by checking your blood sugar (glucose) after you have not eaten for a while (fasting). Hepatitis B test. Hepatitis C test. HIV (human immunodeficiency virus) test. STI (sexually transmitted infection) testing, if you are at risk. Lung cancer screening. Prostate cancer screening. Colorectal cancer screening. Talk with your health care provider about your test results, treatment options, and if necessary, the need for more tests. Follow these instructions at home: Eating and drinking  Eat a diet that includes fresh fruits and vegetables, whole grains, lean protein, and low-fat dairy products. Take vitamin and mineral supplements as recommended by your health care provider. Do not drink alcohol if your health care provider tells you not to drink. If you drink alcohol: Limit how much you have to 0-2 drinks a day. Know how much alcohol is in your drink. In the [place] one drink equals one 12 oz bottle of beer (355 mL), one 5 oz glass of wine (148 mL), or one 1 oz glass of hard liquor (44 mL). Lifestyle Brush your teeth every morning and night with fluoride toothpaste. Floss one time each day. Exercise for at least 30 minutes 5 or more days each week. Do not use any products that contain nicotine or tobacco. These products include cigarettes, chewing tobacco, and vaping devices, such as e-cigarettes. If you need help quitting, ask your health care provider. Do not use  drugs. If you are sexually active, practice safe sex. Use a condom or other form of protection to prevent STIs. Take aspirin only as told by your health care provider. Make sure that you understand how much to take  and what form to take. Work with your health care provider to find out whether it is safe and beneficial for you to take aspirin daily. Find healthy ways to manage stress, such as: Meditation, yoga, or listening to music. Journaling. Talking to a trusted person. Spending time with friends and family. Minimize exposure to UV radiation to reduce your risk of skin cancer. Safety Always wear your seat belt while driving or riding in a vehicle. Do not drive: If you have been drinking alcohol. Do not ride with someone who has been drinking. When you are tired or distracted. While texting. If you have been using any mind-altering substances or drugs. Wear a helmet and other protective equipment during sports activities. If you have firearms in your house, make sure you follow all gun safety procedures. What's next? Go to your health care provider once a year for an annual wellness visit. Ask your health care provider how often you should have your eyes and teeth checked. Stay up to date on all vaccines. This information is not intended to replace advice given to you by your health care provider. Make sure you discuss any questions you have with your health care provider. Document Revised: 11/14/2020 Document Reviewed: 11/14/2020 Elsevier Patient Education  [place],   [name], MD [facility] 08/06/22 8:31 AM

## 2022-08-06 NOTE — Patient Instructions (Signed)
Repeat blood pressure looks okay and based on home readings I do not think we need to make any blood pressure med changes at this time.  I will let you know if there are any concerns on the labs.  Follow-up in 6 months but if any questions prior please let me know.  Take care!  Preventive Care 67-60 Years Old, Male Preventive care refers to lifestyle choices and visits with your health care provider that can promote health and wellness. Preventive care visits are also called wellness exams. What can I expect for my preventive care visit? Counseling During your preventive care visit, your health care provider may ask about your: Medical history, including: Past medical problems. Family medical history. Current health, including: Emotional well-being. Home life and relationship well-being. Sexual activity. Lifestyle, including: Alcohol, nicotine or tobacco, and drug use. Access to firearms. Diet, exercise, and sleep habits. Safety issues such as seatbelt and bike helmet use. Sunscreen use. Work and work Statistician. Physical exam Your health care provider will check your: Height and weight. These may be used to calculate your BMI (body mass index). BMI is a measurement that tells if you are at a healthy weight. Waist circumference. This measures the distance around your waistline. This measurement also tells if you are at a healthy weight and may help predict your risk of certain diseases, such as type 2 diabetes and high blood pressure. Heart rate and blood pressure. Body temperature. Skin for abnormal spots. What immunizations do I need?  Vaccines are usually given at various ages, according to a schedule. Your health care provider will recommend vaccines for you based on your age, medical history, and lifestyle or other factors, such as travel or where you work. What tests do I need? Screening Your health care provider may recommend screening tests for certain conditions. This may  include: Lipid and cholesterol levels. Diabetes screening. This is done by checking your blood sugar (glucose) after you have not eaten for a while (fasting). Hepatitis B test. Hepatitis C test. HIV (human immunodeficiency virus) test. STI (sexually transmitted infection) testing, if you are at risk. Lung cancer screening. Prostate cancer screening. Colorectal cancer screening. Talk with your health care provider about your test results, treatment options, and if necessary, the need for more tests. Follow these instructions at home: Eating and drinking  Eat a diet that includes fresh fruits and vegetables, whole grains, lean protein, and low-fat dairy products. Take vitamin and mineral supplements as recommended by your health care provider. Do not drink alcohol if your health care provider tells you not to drink. If you drink alcohol: Limit how much you have to 0-2 drinks a day. Know how much alcohol is in your drink. In the [place] one drink equals one 12 oz bottle of beer (355 mL), one 5 oz glass of wine (148 mL), or one 1 oz glass of hard liquor (44 mL). Lifestyle Brush your teeth every morning and night with fluoride toothpaste. Floss one time each day. Exercise for at least 30 minutes 5 or more days each week. Do not use any products that contain nicotine or tobacco. These products include cigarettes, chewing tobacco, and vaping devices, such as e-cigarettes. If you need help quitting, ask your health care provider. Do not use drugs. If you are sexually active, practice safe sex. Use a condom or other form of protection to prevent STIs. Take aspirin only as told by your health care provider. Make sure that you understand how much to take and  what form to take. Work with your health care provider to find out whether it is safe and beneficial for you to take aspirin daily. Find healthy ways to manage stress, such as: Meditation, yoga, or listening to music. Journaling. Talking to a  trusted person. Spending time with friends and family. Minimize exposure to UV radiation to reduce your risk of skin cancer. Safety Always wear your seat belt while driving or riding in a vehicle. Do not drive: If you have been drinking alcohol. Do not ride with someone who has been drinking. When you are tired or distracted. While texting. If you have been using any mind-altering substances or drugs. Wear a helmet and other protective equipment during sports activities. If you have firearms in your house, make sure you follow all gun safety procedures. What's next? Go to your health care provider once a year for an annual wellness visit. Ask your health care provider how often you should have your eyes and teeth checked. Stay up to date on all vaccines. This information is not intended to replace advice given to you by your health care provider. Make sure you discuss any questions you have with your health care provider. Document Revised: 11/14/2020 Document Reviewed: 11/14/2020 Elsevier Patient Education  [place].

## 2022-08-14 ENCOUNTER — Other Ambulatory Visit: Payer: Self-pay

## 2022-08-14 ENCOUNTER — Telehealth: Payer: Self-pay | Admitting: Family Medicine

## 2022-08-14 DIAGNOSIS — R972 Elevated prostate specific antigen [PSA]: Secondary | ICD-10-CM

## 2022-08-14 NOTE — Telephone Encounter (Signed)
Caller name: JORAWAR STUTE  On DPR?: Yes  Call back number: [phone] (mobile)  Provider they see: Wendie Agreste, MD  Reason for call: Patient called to give Dr.Greene the okay to sent referral for Urology.

## 2022-08-14 NOTE — Telephone Encounter (Signed)
Order has been placed. Will call pt and make him aware of this.

## 2022-08-14 NOTE — Telephone Encounter (Signed)
Lvm to let pt know about the referral.

## 2022-08-25 ENCOUNTER — Ambulatory Visit: Payer: 59 | Admitting: Urology

## 2022-08-25 ENCOUNTER — Encounter: Payer: Self-pay | Admitting: Urology

## 2022-08-25 VITALS — BP 144/84 | HR 75 | Ht 64.0 in | Wt 160.0 lb

## 2022-08-25 DIAGNOSIS — R972 Elevated prostate specific antigen [PSA]: Secondary | ICD-10-CM | POA: Diagnosis not present

## 2022-08-25 LAB — URINALYSIS, ROUTINE W REFLEX MICROSCOPIC
Bilirubin, UA: NEGATIVE
Glucose, UA: NEGATIVE
Ketones, UA: NEGATIVE
Leukocytes,UA: NEGATIVE
Nitrite, UA: NEGATIVE
Protein,UA: NEGATIVE
RBC, UA: NEGATIVE
Specific Gravity, UA: 1.02 (ref 1.005–1.030)
Urobilinogen, Ur: 0.2 mg/dL (ref 0.2–1.0)
pH, UA: 6 (ref 5.0–7.5)

## 2022-08-25 NOTE — Progress Notes (Signed)
Assessment: 1. Elevated PSA     Plan: I personally reviewed the patient's chart including provider notes, and lab results. Today I had a long discussion with the patient regarding PSA and the rationale and controversies of prostate cancer early detection.  I discussed the pros and cons of further evaluation including TRUS and prostate Bx.  Potential adverse events and complications as well as standard instructions were given.  Patient expressed his understanding of these issues. Schedule for TRUS/BX  Chief Complaint:  Chief Complaint  Patient presents with   Elevated PSA    History of Present Illness:  Justin Farley is a 62 y.o. male who is seen for elevated PSA.  PSA results: 9/14 2.55 8/16 2.66 8/17 4.0 10/16 3.8 5/19 3.9 10/19 3.9 11/20 3.5 2/23 3.97 3/24 4.38   No history of prostate biopsy.  No history of UTIs or prostatitis.  He has a family history of prostate cancer in a maternal uncle.  No dysuria or gross hematuria. IPSS = 2 today.  He was previously seen in 12/23 for a vasectomy evaluation.   He is married with children.  His wife is 4 and may be peri or post menopausal.  No history of scrotal trauma or infection.     Past Medical History:  Past Medical History:  Diagnosis Date   Allergy    Colon polyps    Esophagitis    Gastritis    GERD (gastroesophageal reflux disease)    Hypertension    Internal hemorrhoids with grade 2 prolapse 11/28/2014    Past Surgical History:  Past Surgical History:  Procedure Laterality Date   COLONOSCOPY     ESOPHAGOGASTRODUODENOSCOPY     HEMORRHOID BANDING     WISDOM TOOTH EXTRACTION  2009    Allergies:  Allergies  Allergen Reactions   Lamisil [Terbinafine]     Sharp stomach pain   Shellfish Allergy     Family History:  Family History  Problem Relation Age of Onset   Hypertension Mother    Hypertension Sister    Diabetes Brother    Prostate cancer Maternal Uncle    Colon cancer Neg Hx    Esophageal  cancer Neg Hx    Rectal cancer Neg Hx    Stomach cancer Neg Hx     Social History:  Social History   Tobacco Use   Smoking status: Never   Smokeless tobacco: Never  Vaping Use   Vaping Use: Never used  Substance Use Topics   Alcohol use: Yes    Comment: special occasions   Drug use: No    ROS: Constitutional:  Negative for fever, chills, weight loss CV: Negative for chest pain, previous MI, hypertension Respiratory:  Negative for shortness of breath, wheezing, sleep apnea, frequent cough GI:  Negative for nausea, vomiting, bloody stool, GERD  Physical exam: BP (!) 144/84   Pulse 75   Ht 5\' 4"  (1.626 m)   Wt 160 lb (72.6 kg)   BMI 27.46 kg/m  GENERAL APPEARANCE:  Well appearing, well developed, well nourished, NAD HEENT:  Atraumatic, normocephalic, oropharynx clear NECK:  Supple without lymphadenopathy or thyromegaly ABDOMEN:  Soft, non-tender, no masses EXTREMITIES:  Moves all extremities well, without clubbing, cyanosis, or edema NEUROLOGIC:  Alert and oriented x 3, normal gait, CN II-XII grossly intact MENTAL STATUS:  appropriate BACK:  Non-tender to palpation, No CVAT SKIN:  Warm, dry, and intact GU: Prostate: 30 g, NT, no nodules Rectum: Normal tone,  no masses or tenderness  Results:  U/A dipstick: negative

## 2022-08-25 NOTE — Patient Instructions (Signed)
Prostate Biopsy Instructions  Stop all aspirin or blood thinners (aspirin, plavix, coumadin, warfarin, motrin, ibuprofen, advil, aleve, naproxen, naprosyn) for 7 days prior to the procedure.  If you have any questions about stopping these medications, please contact your primary care physician or cardiologist.  Having a light meal prior to the procedure is recommended.  If you are diabetic or have low blood sugar please bring a small snack or glucose tablet.  A Fleets enema is needed and can be purchased over the counter at a local pharmacy. This will need to be administered 2 hours prior to your procedure. Antibiotics will be administered in the clinic at the time of the procedure unless otherwise specified.    If you have any questions or concerns, please feel free to call the office at [phone] or send a Mychart message.   Thank you,  Staff at [facility]  

## 2022-09-09 ENCOUNTER — Ambulatory Visit (HOSPITAL_BASED_OUTPATIENT_CLINIC_OR_DEPARTMENT_OTHER)
Admission: RE | Admit: 2022-09-09 | Discharge: 2022-09-09 | Disposition: A | Discharge status: Home / Self Care | Payer: 59 | Source: Ambulatory Visit | Attending: Urology | Admitting: Urology

## 2022-09-09 ENCOUNTER — Other Ambulatory Visit (HOSPITAL_BASED_OUTPATIENT_CLINIC_OR_DEPARTMENT_OTHER): Payer: Self-pay | Admitting: Urology

## 2022-09-09 ENCOUNTER — Ambulatory Visit (INDEPENDENT_AMBULATORY_CARE_PROVIDER_SITE_OTHER): Payer: 59 | Admitting: Urology

## 2022-09-09 ENCOUNTER — Encounter: Payer: Self-pay | Admitting: Urology

## 2022-09-09 VITALS — BP 144/87 | HR 69 | Ht 64.0 in | Wt 160.0 lb

## 2022-09-09 DIAGNOSIS — R972 Elevated prostate specific antigen [PSA]: Secondary | ICD-10-CM

## 2022-09-09 DIAGNOSIS — N4232 Atypical small acinar proliferation of prostate: Secondary | ICD-10-CM | POA: Diagnosis not present

## 2022-09-09 DIAGNOSIS — Z2989 Encounter for other specified prophylactic measures: Secondary | ICD-10-CM | POA: Diagnosis not present

## 2022-09-09 LAB — URINALYSIS, ROUTINE W REFLEX MICROSCOPIC
Bilirubin, UA: NEGATIVE
Glucose, UA: NEGATIVE
Ketones, UA: NEGATIVE
Leukocytes,UA: NEGATIVE
Nitrite, UA: NEGATIVE
Protein,UA: NEGATIVE
RBC, UA: NEGATIVE
Specific Gravity, UA: 1.025 (ref 1.005–1.030)
Urobilinogen, Ur: 1 mg/dL (ref 0.2–1.0)
pH, UA: 7 (ref 5.0–7.5)

## 2022-09-09 MED ORDER — CEFTRIAXONE SODIUM 1 G IJ SOLR
1.0000 g | Freq: Once | INTRAMUSCULAR | Status: AC
Start: 2022-09-09 — End: 2022-09-09
  Administered 2022-09-09: 1 g via INTRAMUSCULAR

## 2022-09-09 NOTE — Progress Notes (Signed)
IM Injection  Patient is present today for an IM Injection for treatment of infection prevention post prostate biopsy. Drug: Ceftriaxone Dose:1g Location:Right upper outer buttocks Lot: [id number] Exp:03/2024 Patient tolerated well, no complications were noted  Performed by: Latausha Flamm CMA   

## 2022-09-09 NOTE — Progress Notes (Signed)
Assessment: 1. Elevated PSA     Plan: Post biopsy instructions given Return to office in 7-10 days for biopsy results  Chief Complaint:  Chief Complaint  Patient presents with   Prostate Biopsy   History of Present Illness:  Justin Farley is a 62 y.o. male who is seen for further evaluation of elevated PSA.  PSA results: 9/14 2.55 8/16 2.66 8/17 4.0 10/16 3.8 5/19 3.9 10/19 3.9 11/20 3.5 2/23 3.97 3/24 4.38   No history of prostate biopsy.  No history of UTIs or prostatitis.  He has a family history of prostate cancer in a maternal uncle.  No dysuria or gross hematuria. IPSS = 2.   He was previously seen in 12/23 for a vasectomy evaluation.   He is married with children.  His wife is 80 and may be peri or post menopausal.  No history of scrotal trauma or infection.   He presents today for prostate biopsy.  Portions of the above documentation were copied from a prior visit for review purposes only.     Past Medical History:  Past Medical History:  Diagnosis Date   Allergy    Colon polyps    Esophagitis    Gastritis    GERD (gastroesophageal reflux disease)    Hypertension    Internal hemorrhoids with grade 2 prolapse 11/28/2014    Past Surgical History:  Past Surgical History:  Procedure Laterality Date   COLONOSCOPY     ESOPHAGOGASTRODUODENOSCOPY     HEMORRHOID BANDING     WISDOM TOOTH EXTRACTION  2009    Allergies:  Allergies  Allergen Reactions   Lamisil [Terbinafine]     Sharp stomach pain   Shellfish Allergy     Family History:  Family History  Problem Relation Age of Onset   Hypertension Mother    Hypertension Sister    Diabetes Brother    Prostate cancer Maternal Uncle    Colon cancer Neg Hx    Esophageal cancer Neg Hx    Rectal cancer Neg Hx    Stomach cancer Neg Hx     Social History:  Social History   Tobacco Use   Smoking status: Never   Smokeless tobacco: Never  Vaping Use   Vaping Use: Never used  Substance Use  Topics   Alcohol use: Yes    Comment: special occasions   Drug use: No    ROS: Constitutional:  Negative for fever, chills, weight loss CV: Negative for chest pain, previous MI, hypertension Respiratory:  Negative for shortness of breath, wheezing, sleep apnea, frequent cough GI:  Negative for nausea, vomiting, bloody stool, GERD  Physical exam: BP (!) 144/87   Pulse 69   Ht 5\' 4"  (1.626 m)   Wt 160 lb (72.6 kg)   BMI 27.46 kg/m  GENERAL APPEARANCE:  Well appearing, well developed, well nourished, NAD HEENT:  Atraumatic, normocephalic, oropharynx clear NECK:  Supple without lymphadenopathy or thyromegaly ABDOMEN:  Soft, non-tender, no masses EXTREMITIES:  Moves all extremities well, without clubbing, cyanosis, or edema NEUROLOGIC:  Alert and oriented x 3, normal gait, CN II-XII grossly intact MENTAL STATUS:  appropriate BACK:  Non-tender to palpation, No CVAT SKIN:  Warm, dry, and intact  Results: U/A: negative  TRANSRECTAL ULTRASOUND AND PROSTATE BIOPSY  Indication:  Elevated PSA  Prophylactic antibiotic administration: Rocephin  All medications that could result in increased bleeding were discontinued within an appropriate period of the time of biopsy.  Risk including bleeding and infection were discussed.  Informed  consent was obtained.  The patient was placed in the left lateral decubitus position.  PROCEDURE 1.  TRANSRECTAL ULTRASOUND OF THE PROSTATE  The 7 MHz transrectal probe was used to image the prostate.  Anal stenosis was noted.  TRUS volume: 60.6 ml  Hypoechoic areas: None  Hyperechoic areas: None  Central calcifications: not present  Margins:  normal  Seminal Vesicles: normal   PROCEDURE 2:  PROSTATE BIOPSY  A periprostatic block was performed using 1% lidocaine and transrectal ultrasound guidance. Under transrectal ultrasound guidance, and using the Biopty gun, prostate biopsies were obtained systematically from the apex, mid gland, and  base bilaterally.  A total of 12 cores were obtained.  Hemostasis was obtained with gentle pressure on the prostate.  The procedures were well-tolerated.  No significant bleeding was noted at the end of the procedure.  The patient was stable for discharge from the office.

## 2022-09-12 ENCOUNTER — Encounter: Payer: Self-pay | Admitting: Urology

## 2022-09-15 ENCOUNTER — Ambulatory Visit: Payer: 59 | Admitting: Urology

## 2022-09-15 ENCOUNTER — Encounter: Payer: Self-pay | Admitting: Urology

## 2022-09-15 VITALS — BP 152/90 | HR 75

## 2022-09-15 DIAGNOSIS — N4232 Atypical small acinar proliferation of prostate: Secondary | ICD-10-CM | POA: Diagnosis not present

## 2022-09-15 DIAGNOSIS — R972 Elevated prostate specific antigen [PSA]: Secondary | ICD-10-CM

## 2022-09-15 LAB — URINALYSIS, ROUTINE W REFLEX MICROSCOPIC
Bilirubin, UA: NEGATIVE
Glucose, UA: NEGATIVE
Ketones, UA: NEGATIVE
Leukocytes,UA: NEGATIVE
Nitrite, UA: NEGATIVE
Specific Gravity, UA: 1.025 (ref 1.005–1.030)
Urobilinogen, Ur: 0.2 mg/dL (ref 0.2–1.0)
pH, UA: 6.5 (ref 5.0–7.5)

## 2022-09-15 LAB — MICROSCOPIC EXAMINATION
Cast Type: NONE SEEN
Casts: NONE SEEN /lpf
Crystal Type: NONE SEEN
Crystals: NONE SEEN
Renal Epithel, UA: NONE SEEN /hpf
Trichomonas, UA: NONE SEEN
WBC, UA: NONE SEEN /hpf (ref 0–5)
Yeast, UA: NONE SEEN

## 2022-09-15 NOTE — Progress Notes (Signed)
Assessment: 1. Elevated PSA   2. Atypical small acinar proliferation of prostate     Plan: Biopsy results discussed with the patient today. Given ASAP, recommend close monitoring of PSA. Return to office in 3 months  Will consider MRI of prostate in 3-6 months.  Chief Complaint:  Chief Complaint  Patient presents with   Results   History of Present Illness:  Justin Farley is a 62 y.o. male who is seen for prostate biopsy results.  PSA results: 9/14 2.55 8/16 2.66 8/17 4.0 10/16 3.8 5/19 3.9 10/19 3.9 11/20 3.5 2/23 3.97 3/24 4.38   No history of prostate biopsy.  No history of UTIs or prostatitis.  He has a family history of prostate cancer in a maternal uncle.  No dysuria or gross hematuria. IPSS = 2.   He was previously seen in 12/23 for a vasectomy evaluation.   He is married with children.  His wife is 74 and may be peri or post menopausal.  No history of scrotal trauma or infection.   He returns today following his transrectal ultrasound and biopsy of the prostate on 09/09/22. 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    IPSS = 2 Patient without erectile dysfunction.     Portions of the above documentation were copied from a prior visit for review purposes only.     Past Medical History:  Past Medical History:  Diagnosis Date   Allergy    Colon polyps    Esophagitis    Gastritis    GERD (gastroesophageal reflux disease)    Hypertension    Internal hemorrhoids with grade 2 prolapse 11/28/2014    Past Surgical History:  Past Surgical History:  Procedure Laterality Date   COLONOSCOPY     ESOPHAGOGASTRODUODENOSCOPY     HEMORRHOID BANDING     WISDOM TOOTH EXTRACTION  2009    Allergies:   Allergies  Allergen Reactions   Lamisil [Terbinafine]     Sharp stomach pain   Shellfish Allergy     Family History:  Family History  Problem Relation Age of Onset   Hypertension Mother    Hypertension Sister    Diabetes Brother    Prostate cancer Maternal Uncle    Colon cancer Neg Hx    Esophageal cancer Neg Hx    Rectal cancer Neg Hx    Stomach cancer Neg Hx     Social History:  Social History   Tobacco Use   Smoking status: Never   Smokeless tobacco: Never  Vaping Use   Vaping Use: Never used  Substance Use Topics   Alcohol use: Yes    Comment: special occasions   Drug use: No    ROS: Constitutional:  Negative for fever, chills, weight loss CV: Negative for chest pain, previous MI, hypertension Respiratory:  Negative for shortness of breath, wheezing, sleep apnea, frequent cough GI:  Negative for nausea, vomiting, bloody stool, GERD  Physical exam: BP (!) 152/90   Pulse 75  GENERAL APPEARANCE:  Well appearing, well developed, well nourished, NAD HEENT:  Atraumatic, normocephalic, oropharynx clear NECK:  Supple without lymphadenopathy or thyromegaly ABDOMEN:  Soft, non-tender, no masses EXTREMITIES:  Moves  all extremities well, without clubbing, cyanosis, or edema NEUROLOGIC:  Alert and oriented x 3, normal gait, CN II-XII grossly intact MENTAL STATUS:  appropriate BACK:  Non-tender to palpation, No CVAT SKIN:  Warm, dry, and intact  Results: U/A: 3-10 RBCs

## 2022-09-22 ENCOUNTER — Telehealth: Payer: Self-pay | Admitting: Urology

## 2022-09-22 NOTE — Telephone Encounter (Signed)
Patient had a biopsy done on April 15th and patient called on Saturday, April 20th and left a voicemail stating he needed to talk to someone about some excessive bleeding he is having.  Patients callback #: [phone]

## 2022-09-30 ENCOUNTER — Telehealth: Payer: Self-pay | Admitting: Urology

## 2022-09-30 NOTE — Telephone Encounter (Signed)
Patient called back today and stated he had some concerns after his procedure he would like to discuss.   Patients callback #: [phone]

## 2022-10-01 NOTE — Telephone Encounter (Signed)
Patient reports having trouble with hemorrhoids since the procedure and OTC meds are not helping.

## 2022-12-15 ENCOUNTER — Encounter: Payer: Self-pay | Admitting: Urology

## 2022-12-15 ENCOUNTER — Ambulatory Visit: Payer: 59 | Admitting: Urology

## 2022-12-15 VITALS — BP 122/81 | HR 80 | Ht 64.0 in | Wt 160.0 lb

## 2022-12-15 DIAGNOSIS — N4232 Atypical small acinar proliferation of prostate: Secondary | ICD-10-CM | POA: Diagnosis not present

## 2022-12-15 DIAGNOSIS — R972 Elevated prostate specific antigen [PSA]: Secondary | ICD-10-CM | POA: Diagnosis not present

## 2022-12-15 LAB — MICROSCOPIC EXAMINATION
Bacteria, UA: NONE SEEN
Cast Type: NONE SEEN
Casts: NONE SEEN /lpf
Crystal Type: NONE SEEN
Crystals: NONE SEEN
Mucus, UA: NONE SEEN
Renal Epithel, UA: NONE SEEN /hpf
Trichomonas, UA: NONE SEEN
Yeast, UA: NONE SEEN

## 2022-12-15 LAB — URINALYSIS, ROUTINE W REFLEX MICROSCOPIC
Bilirubin, UA: NEGATIVE
Glucose, UA: NEGATIVE
Ketones, UA: NEGATIVE
Leukocytes,UA: NEGATIVE
Nitrite, UA: NEGATIVE
Protein,UA: NEGATIVE
Specific Gravity, UA: 1.015 (ref 1.005–1.030)
Urobilinogen, Ur: 0.2 mg/dL (ref 0.2–1.0)
pH, UA: 6.5 (ref 5.0–7.5)

## 2022-12-15 NOTE — Progress Notes (Signed)
Assessment: 1. Elevated PSA   2. Atypical small acinar proliferation of prostate     Plan: Given ASAP, recommend close monitoring of PSA. isoPSA sent today - will call with results Will consider MRI of prostate in 3-6 months.  Chief Complaint:  Chief Complaint  Patient presents with   Elevated PSA   History of Present Illness:  Justin Farley is a 62 y.o. male who is seen for continued evaluation of elevated PSA and ASAP on prostate biopsy.  PSA results: 9/14 2.55 8/16 2.66 8/17 4.0 10/16 3.8 5/19 3.9 10/19 3.9 11/20 3.5 2/23 3.97 3/24 4.38   No history of prostate biopsy.  No history of UTIs or prostatitis.  He has a family history of prostate cancer in a maternal uncle.  No dysuria or gross hematuria. IPSS = 2.   He was previously seen in 12/23 for a vasectomy evaluation.   He is married with children.  His wife is 63 and may be peri or post menopausal.  No history of scrotal trauma or infection.   He underwent a transrectal ultrasound and biopsy of the prostate on 09/09/22. 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    IPSS = 2 Patient without erectile dysfunction.    He returns today for follow-up.  He is doing well.  No new lower urinary tract symptoms.  No dysuria or gross hematuria. IPSS = 2 today.   Portions of the above documentation were copied from a prior visit for review purposes only.     Past Medical History:  Past Medical History:  Diagnosis Date   Allergy    Colon polyps    Esophagitis    Gastritis    GERD (gastroesophageal reflux disease)    Hypertension    Internal hemorrhoids with grade 2 prolapse 11/28/2014    Past Surgical History:  Past Surgical History:  Procedure Laterality  Date   COLONOSCOPY     ESOPHAGOGASTRODUODENOSCOPY     HEMORRHOID BANDING     WISDOM TOOTH EXTRACTION  2009    Allergies:  Allergies  Allergen Reactions   Lamisil [Terbinafine]     Sharp stomach pain   Shellfish Allergy     Family History:  Family History  Problem Relation Age of Onset   Hypertension Mother    Hypertension Sister    Diabetes Brother    Prostate cancer Maternal Uncle    Colon cancer Neg Hx    Esophageal cancer Neg Hx    Rectal cancer Neg Hx    Stomach cancer Neg Hx     Social History:  Social History   Tobacco Use   Smoking status: Never   Smokeless tobacco: Never  Vaping Use   Vaping status: Never Used  Substance Use Topics   Alcohol use: Yes    Comment: special occasions   Drug use: No    ROS: Constitutional:  Negative for fever, chills, weight loss CV: Negative for chest pain, previous MI, hypertension Respiratory:  Negative for shortness of breath, wheezing, sleep apnea, frequent cough GI:  Negative for nausea, vomiting, bloody stool, GERD  Physical exam: BP 122/81   Pulse 80   Ht 5\' 4"  (1.626  m)   Wt 160 lb (72.6 kg)   BMI 27.46 kg/m  GENERAL APPEARANCE:  Well appearing, well developed, well nourished, NAD HEENT:  Atraumatic, normocephalic, oropharynx clear NECK:  Supple without lymphadenopathy or thyromegaly ABDOMEN:  Soft, non-tender, no masses EXTREMITIES:  Moves all extremities well, without clubbing, cyanosis, or edema NEUROLOGIC:  Alert and oriented x 3, normal gait, CN II-XII grossly intact MENTAL STATUS:  appropriate BACK:  Non-tender to palpation, No CVAT SKIN:  Warm, dry, and intact  Results: U/A: 0-5 WBC, 0-2 RBC

## 2022-12-24 ENCOUNTER — Encounter: Payer: Self-pay | Admitting: Urology

## 2023-01-15 ENCOUNTER — Encounter (INDEPENDENT_AMBULATORY_CARE_PROVIDER_SITE_OTHER): Payer: Self-pay

## 2023-02-06 ENCOUNTER — Ambulatory Visit: Payer: 59 | Admitting: Family Medicine

## 2023-02-11 ENCOUNTER — Ambulatory Visit: Payer: 59 | Admitting: Family Medicine

## 2023-02-11 ENCOUNTER — Encounter: Payer: Self-pay | Admitting: Family Medicine

## 2023-02-11 VITALS — BP 138/72 | HR 67 | Temp 97.8°F | Wt 168.0 lb

## 2023-02-11 DIAGNOSIS — K2100 Gastro-esophageal reflux disease with esophagitis, without bleeding: Secondary | ICD-10-CM | POA: Diagnosis not present

## 2023-02-11 DIAGNOSIS — R7303 Prediabetes: Secondary | ICD-10-CM

## 2023-02-11 DIAGNOSIS — E7800 Pure hypercholesterolemia, unspecified: Secondary | ICD-10-CM

## 2023-02-11 DIAGNOSIS — Z23 Encounter for immunization: Secondary | ICD-10-CM | POA: Diagnosis not present

## 2023-02-11 DIAGNOSIS — N529 Male erectile dysfunction, unspecified: Secondary | ICD-10-CM

## 2023-02-11 DIAGNOSIS — I10 Essential (primary) hypertension: Secondary | ICD-10-CM

## 2023-02-11 DIAGNOSIS — M26622 Arthralgia of left temporomandibular joint: Secondary | ICD-10-CM

## 2023-02-11 LAB — COMPREHENSIVE METABOLIC PANEL
ALT: 32 U/L (ref 0–53)
AST: 37 U/L (ref 0–37)
Albumin: 4.5 g/dL (ref 3.5–5.2)
Alkaline Phosphatase: 29 U/L — ABNORMAL LOW (ref 39–117)
BUN: 13 mg/dL (ref 6–23)
CO2: 29 meq/L (ref 19–32)
Calcium: 9.9 mg/dL (ref 8.4–10.5)
Chloride: 100 meq/L (ref 96–112)
Creatinine, Ser: 1.18 mg/dL (ref 0.40–1.50)
GFR: 67.35 mL/min (ref >60.00)
Glucose, Bld: 92 mg/dL (ref 70–99)
Potassium: 4.7 meq/L (ref 3.5–5.1)
Sodium: 137 meq/L (ref 135–145)
Total Bilirubin: 1 mg/dL (ref 0.2–1.2)
Total Protein: 8 g/dL (ref 6.0–8.3)

## 2023-02-11 LAB — LIPID PANEL
Cholesterol: 156 mg/dL (ref 0–200)
HDL: 63.7 mg/dL (ref >39.00)
LDL Cholesterol: 73 mg/dL (ref 0–99)
NonHDL: 92.77
Total CHOL/HDL Ratio: 2
Triglycerides: 99 mg/dL (ref 0.0–149.0)
VLDL: 19.8 mg/dL (ref 0.0–40.0)

## 2023-02-11 LAB — HEMOGLOBIN A1C: Hgb A1c MFr Bld: 6.1 % (ref 4.6–6.5)

## 2023-02-11 NOTE — Progress Notes (Signed)
Subjective:  Patient ID: Justin Farley, male    DOB: 12-19-1960  Age: 62 y.o. MRN: [id number]  CC:  Chief Complaint  Patient presents with   Medical Management of Chronic Issues    Med and labs    HPI Justin Farley presents for   Hypertension: Hydrochlorothiazide 12.5 mg daily borderline readings previously.  Home readings: usually lower  - 120/range.  Bad news about his mother's health recently - diagnosed with renal cancer. Concerned about her.  BP Readings from Last 3 Encounters:  02/11/23 138/72  12/15/22 122/81  09/15/22 (!) 152/90   Lab Results  Component Value Date   CREATININE 1.01 08/06/2022    GERD Omeprazole 40 mg daily, unfortunately has had breakthrough symptoms with trial of intermittent dosing previously. No recent breakthrough sx's.   Erectile dysfunction Sildenafil 50 mg to 100 mg as needed, typically has taken 50 mg dose that has been effective without vision or hearing changes, chest pain or dyspnea with exertion or headache/flushing Still working well with 1/2 dose.   Prediabetes: Diet/exercise approach.  Weight has gone up slightly since last visit. Had been off exercise for awhile. Had been lessening exercise during prostate issue workup, including biopsy.  PSA has improved. Following every 3 months. Returning to exercise.  Slight elevated LDL on previous labs. No fast food, cut way back on soda.  Lab Results  Component Value Date   HGBA1C 5.9 08/06/2022   Wt Readings from Last 3 Encounters:  02/11/23 168 lb (76.2 kg)  12/15/22 160 lb (72.6 kg)  09/09/22 160 lb (72.6 kg)   Left jaw pain Intermittent - few weeks ago with chewing, usually resolves in few days.    Health maintenance: UTD.    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/15/2022   Elevated PSA 08/25/2022   Internal hemorrhoids with grade 2 prolapse 11/28/2014   HTN (hypertension) 04/19/2013    GERD (gastroesophageal reflux disease) 04/19/2013   Past Medical History:  Diagnosis Date   Allergy    Colon polyps    Esophagitis    Gastritis    GERD (gastroesophageal reflux disease)    Hypertension    Internal hemorrhoids with grade 2 prolapse 11/28/2014   Past Surgical History:  Procedure Laterality Date   COLONOSCOPY     ESOPHAGOGASTRODUODENOSCOPY     HEMORRHOID BANDING     WISDOM TOOTH EXTRACTION  2009   Allergies  Allergen Reactions   Lamisil [Terbinafine]     Sharp stomach pain   Shellfish Allergy    Prior to Admission medications   Medication Sig Start Date End Date Taking? Authorizing Provider  albuterol (VENTOLIN HFA) 108 (90 Base) MCG/ACT inhaler Inhale 2 puffs into the lungs every 4 (four) hours as needed for wheezing. 10/03/19  Yes Shade Flood, MD  betamethasone dipropionate 0.05 % cream Apply topically daily. 07/17/21  Yes Shade Flood, MD  hydrochlorothiazide (MICROZIDE) 12.5 MG capsule TAKE 1 CAPSULE EVERY MORNING. 08/06/22  Yes Shade Flood, MD  omeprazole (PRILOSEC) 40 MG capsule TAKE 1 CAPSULE (40 MG TOTAL) BY MOUTH DAILY.as needed 08/06/22  Yes Shade Flood, MD  sildenafil (VIAGRA) 100 MG tablet Take 0.5-1 tablets (50-100 mg total) by mouth daily as needed for erectile dysfunction. 08/06/22  Yes Shade Flood, MD   Social History   Socioeconomic History   Marital status: Married    Spouse name: Not on file   Number of  children: 4   Years of education: Not on file   Highest education level: Not on file  Occupational History   Occupation: Estate agent   Occupation: supervisor    Comment: [employer]  Tobacco Use   Smoking status: Never   Smokeless tobacco: Never  Vaping Use   Vaping status: Never Used  Substance and Sexual Activity   Alcohol use: Yes    Comment: special occasions   Drug use: No   Sexual activity: Yes    Birth control/protection: Condom  Other Topics Concern   Not on file  Social History Narrative    Single. Education: [employer]. Exercise: 3 days a week for 2 hours.   Social Determinants of Health   Financial Resource Strain: Not on file  Food Insecurity: Not on file  Transportation Needs: Not on file  Physical Activity: Not on file  Stress: Not on file  Social Connections: Unknown (10/11/2021)   Received from [facility], [facility]   Social Network    Social Network: Not on file  Intimate Partner Violence: Unknown (09/02/2021)   Received from [facility], [facility]   HITS    Physically Hurt: Not on file    Insult or Talk Down To: Not on file    Threaten Physical Harm: Not on file    Scream or Curse: Not on file    Review of Systems  Constitutional:  Negative for fatigue and unexpected weight change.  Eyes:  Negative for visual disturbance.  Respiratory:  Negative for cough, chest tightness and shortness of breath.   Cardiovascular:  Negative for chest pain, palpitations and leg swelling.  Gastrointestinal:  Negative for abdominal pain and blood in stool.  Neurological:  Negative for dizziness, light-headedness and headaches.     Objective:   Vitals:   02/11/23 0928  BP: 138/72  Pulse: 67  Temp: 97.8 F (36.6 C)  TempSrc: Temporal  SpO2: 100%  Weight: 168 lb (76.2 kg)     Physical Exam Vitals reviewed.  Constitutional:      Appearance: He is well-developed.  HENT:     Head: Normocephalic and atraumatic.  Neck:     Vascular: No carotid bruit or JVD.  Cardiovascular:     Rate and Rhythm: Normal rate and regular rhythm.     Heart sounds: Normal heart sounds. No murmur heard. Pulmonary:     Effort: Pulmonary effort is normal.     Breath sounds: Normal breath sounds. No rales.  Musculoskeletal:     Right lower leg: No edema.     Left lower leg: No edema.     Comments: No click or pain with opening/closing jaw or lateral deviation of jaw.  Area of previous discomfort at left TMJ.  No soft tissue swelling or jaw tenderness on exam currently.   Skin:    General: Skin is warm and dry.  Neurological:     Mental Status: He is alert and oriented to person, place, and time.  Psychiatric:        Mood and Affect: Mood normal.        Assessment & Plan:  Justin Farley is a 62 y.o. male . Essential hypertension - Plan: Comprehensive metabolic panel  -Borderline control, handout given on management of hypertension.  With recent restart of exercise will continue to monitor, RTC precautions, 26-month follow-up.  Continue same dose HCTZ for now.  Need for immunization against influenza - Plan: Flu vaccine trivalent PF, 6mos and older(Flulaval,Afluria,Fluarix,Fluzone)  Gastroesophageal reflux  disease with esophagitis, unspecified whether hemorrhage  -Stable control with daily PPI, continue same.  Prediabetes - Plan: Comprehensive metabolic panel, Hemoglobin A1c  -Check A1c, has restarted exercise recently so if elevated would continue to monitor, no new meds for now.  Erectile dysfunction, unspecified erectile dysfunction type  -Half dosing of Viagra has been effective without new side effects, potential risks have been discussed previously.  No changes  TMJ tenderness, left  --Mild TMJ syndrome, asymptomatic at present, intermittent symptoms.  Handout given, symptomatic care with RTC precautions discussed.  Elevated LDL cholesterol level - Plan: Lipid panel  -No current statin, repeat lipids adjustment of plan accordingly.  No orders of the defined types were placed in this encounter.  Patient Instructions  I am sorry to hear about the recent news with your mother's health.  I do not think we need to make any medication changes today.  Blood pressure is borderline but increasing exercise should help with both blood pressure as well as the blood sugar.  I will check those levels today as well.  Pain of the jaw is likely TMJ syndrome but with intermittent symptoms I am not concerned with prescribing new medication or any x-rays at  this time.  See information below.  Follow-up if that becomes a more persistent issue.  Hang in there.  Temporomandibular Joint Syndrome  Temporomandibular joint syndrome (TMJ syndrome) is a condition that causes pain in the temporomandibular joints. These joints are located near your ears and allow your jaw to open and close. For people with TMJ syndrome, chewing, biting, or other movements of the jaw can be difficult or painful. TMJ syndrome is often mild and goes away within a few weeks. However, sometimes the condition becomes a long-term (chronic) problem. What are the causes? This condition may be caused by: Grinding your teeth or clenching your jaw. Some people do this when they are stressed. Arthritis. An injury to the jaw. A head or neck injury. Teeth or dentures that are not aligned well. In some cases, the cause of TMJ syndrome may not be known. What are the signs or symptoms? The most common symptom of this condition is aching pain on the side of the head in the area of the TMJ. Other symptoms may include: Pain when moving your jaw, such as when chewing or biting. Not being able to open your jaw all the way. Making a clicking sound when you open your mouth. Headache. Earache. Neck or shoulder pain. How is this diagnosed? This condition may be diagnosed based on: Your symptoms and medical history. A physical exam. Your health care provider may check the range of motion of your jaw. Imaging tests, such as X-rays or an MRI. You may also need to see your dentist, who will check if your teeth and jaw are lined up correctly. How is this treated? TMJ syndrome often goes away on its own. If treatment is needed, it may include: Eating soft foods and applying ice or heat. Medicines to relieve pain or inflammation. Medicines or massage to relax the muscles. A splint, bite plate, or mouthpiece to prevent teeth grinding or jaw clenching. Relaxation techniques or counseling to help  reduce stress. A therapy for pain in which an electrical current is applied to the nerves through the skin (transcutaneous electrical nerve stimulation). Acupuncture. This may help to relieve pain. Jaw surgery. This is rarely needed. Follow these instructions at home:  Eating and drinking Eat a soft diet if you are having trouble chewing.  Avoid foods that require a lot of chewing. Do not chew gum. General instructions Take over-the-counter and prescription medicines only as told by your health care provider. If directed, put ice on the painful area. To do this: Put ice in a plastic bag. Place a towel between your skin and the bag. Leave the ice on for 20 minutes, 2-3 times a day. Remove the ice if your skin turns bright red. This is very important. If you cannot feel pain, heat, or cold, you have a greater risk of damage to the area. Apply a warm, wet cloth (warm compress) to the painful area as told. Massage your jaw area and do any jaw stretching exercises as told by your health care provider. If you were given a splint, bite plate, or mouthpiece, wear it as told by your health care provider. Keep all follow-up visits. This is important. Where to find more information [employer]: [employer]za Contact a health care provider if: You have trouble eating. You have new or worsening symptoms. Get help right away if: Your jaw locks. Summary Temporomandibular joint syndrome (TMJ syndrome) is a condition that causes pain in the temporomandibular joints. These joints are located near your ears and allow your jaw to open and close. TMJ syndrome is often mild and goes away within a few weeks. However, sometimes the condition becomes a long-term (chronic) problem. Symptoms include an aching pain on the side of the head in the area of the TMJ, pain when chewing or biting, and being unable to open your jaw all the way. You may also make a clicking  sound when you open your mouth. TMJ syndrome often goes away on its own. If treatment is needed, it may include medicines to relieve pain, reduce inflammation, or relax the muscles. A splint, bite plate, or mouthpiece may also be used to prevent teeth grinding or jaw clenching. This information is not intended to replace advice given to you by your health care provider. Make sure you discuss any questions you have with your health care provider. Document Revised: 12/30/2020 Document Reviewed: 12/30/2020 Elsevier Patient Education  2024 Elsevier Inc.    Managing Your Hypertension Hypertension, also called high blood pressure, is when the force of the blood pressing against the walls of the arteries is too strong. Arteries are blood vessels that carry blood from your heart throughout your body. Hypertension forces the heart to work harder to pump blood and may cause the arteries to become narrow or stiff. Understanding blood pressure readings A blood pressure reading includes a higher number over a lower number: The first, or top, number is called the systolic pressure. It is a measure of the pressure in your arteries as your heart beats. The second, or bottom number, is called the diastolic pressure. It is a measure of the pressure in your arteries as the heart relaxes. For most people, a normal blood pressure is below 120/80. Your personal target blood pressure may vary depending on your medical conditions, your age, and other factors. Blood pressure is classified into four stages. Based on your blood pressure reading, your health care provider may use the following stages to determine what type of treatment you need, if any. Systolic pressure and diastolic pressure are measured in a unit called millimeters of mercury (mmHg). Normal Systolic pressure: below 120. Diastolic pressure: below 80. Elevated Systolic pressure: 120-129. Diastolic pressure: below 80. Hypertension stage 1 Systolic  pressure: 130-139. Diastolic pressure: 80-89. Hypertension stage 2 Systolic  pressure: 140 or above. Diastolic pressure: 90 or above. How can this condition affect me? Managing your hypertension is very important. Over time, hypertension can damage the arteries and decrease blood flow to parts of the body, including the brain, heart, and kidneys. Having untreated or uncontrolled hypertension can lead to: A heart attack. A stroke. A weakened blood vessel (aneurysm). Heart failure. Kidney damage. Eye damage. Memory and concentration problems. Vascular dementia. What actions can I take to manage this condition? Hypertension can be managed by making lifestyle changes and possibly by taking medicines. Your health care provider will help you make a plan to bring your blood pressure within a normal range. You may be referred for counseling on a healthy diet and physical activity. Nutrition  Eat a diet that is high in fiber and potassium, and low in salt (sodium), added sugar, and fat. An example eating plan is called the DASH diet. DASH stands for Dietary Approaches to Stop Hypertension. To eat this way: Eat plenty of fresh fruits and vegetables. Try to fill one-half of your plate at each meal with fruits and vegetables. Eat whole grains, such as whole-wheat pasta, brown rice, or whole-grain bread. Fill about one-fourth of your plate with whole grains. Eat low-fat dairy products. Avoid fatty cuts of meat, processed or cured meats, and poultry with skin. Fill about one-fourth of your plate with lean proteins such as fish, chicken without skin, beans, eggs, and tofu. Avoid pre-made and processed foods. These tend to be higher in sodium, added sugar, and fat. Reduce your daily sodium intake. Many people with hypertension should eat less than 1,500 mg of sodium a day. Lifestyle  Work with your health care provider to maintain a healthy body weight or to lose weight. Ask what an ideal weight is for  you. Get at least 30 minutes of exercise that causes your heart to beat faster (aerobic exercise) most days of the week. Activities may include walking, swimming, or biking. Include exercise to strengthen your muscles (resistance exercise), such as weight lifting, as part of your weekly exercise routine. Try to do these types of exercises for 30 minutes at least 3 days a week. Do not use any products that contain nicotine or tobacco. These products include cigarettes, chewing tobacco, and vaping devices, such as e-cigarettes. If you need help quitting, ask your health care provider. Control any long-term (chronic) conditions you have, such as high cholesterol or diabetes. Identify your sources of stress and find ways to manage stress. This may include meditation, deep breathing, or making time for fun activities. Alcohol use Do not drink alcohol if: Your health care provider tells you not to drink. You are pregnant, may be pregnant, or are planning to become pregnant. If you drink alcohol: Limit how much you have to: 0-1 drink a day for women. 0-2 drinks a day for men. Know how much alcohol is in your drink. In the [place] one drink equals one 12 oz bottle of beer (355 mL), one 5 oz glass of wine (148 mL), or one 1 oz glass of hard liquor (44 mL). Medicines Your health care provider may prescribe medicine if lifestyle changes are not enough to get your blood pressure under control and if: Your systolic blood pressure is 130 or higher. Your diastolic blood pressure is 80 or higher. Take medicines only as told by your health care provider. Follow the directions carefully. Blood pressure medicines must be taken as told by your health care provider. The medicine does not  work as well when you skip doses. Skipping doses also puts you at risk for problems. Monitoring Before you monitor your blood pressure: Do not smoke, drink caffeinated beverages, or exercise within 30 minutes before taking a  measurement. Use the bathroom and empty your bladder (urinate). Sit quietly for at least 5 minutes before taking measurements. Monitor your blood pressure at home as told by your health care provider. To do this: Sit with your back straight and supported. Place your feet flat on the floor. Do not cross your legs. Support your arm on a flat surface, such as a table. Make sure your upper arm is at heart level. Each time you measure, take two or three readings one minute apart and record the results. You may also need to have your blood pressure checked regularly by your health care provider. General information Talk with your health care provider about your diet, exercise habits, and other lifestyle factors that may be contributing to hypertension. Review all the medicines you take with your health care provider because there may be side effects or interactions. Keep all follow-up visits. Your health care provider can help you create and adjust your plan for managing your high blood pressure. Where to find more information National Heart, Lung, and Blood Institute: PopSteam.is American Heart Association: www.heart.org Contact a health care provider if: You think you are having a reaction to medicines you have taken. You have repeated (recurrent) headaches. You feel dizzy. You have swelling in your ankles. You have trouble with your vision. Get help right away if: You develop a severe headache or confusion. You have unusual weakness or numbness, or you feel faint. You have severe pain in your chest or abdomen. You vomit repeatedly. You have trouble breathing. These symptoms may be an emergency. Get help right away. Call 911. Do not wait to see if the symptoms will go away. Do not drive yourself to the hospital. Summary Hypertension is when the force of blood pumping through your arteries is too strong. If this condition is not controlled, it may put you at risk for serious  complications. Your personal target blood pressure may vary depending on your medical conditions, your age, and other factors. For most people, a normal blood pressure is less than 120/80. Hypertension is managed by lifestyle changes, medicines, or both. Lifestyle changes to help manage hypertension include losing weight, eating a healthy, low-sodium diet, exercising more, stopping smoking, and limiting alcohol. This information is not intended to replace advice given to you by your health care provider. Make sure you discuss any questions you have with your health care provider. Document Revised: 01/31/2021 Document Reviewed: 01/31/2021 Elsevier Patient Education  2024 Elsevier Inc.     Signed,   Meredith Staggers, MD [facility], [facility] 02/11/23 10:12 AM

## 2023-02-11 NOTE — Patient Instructions (Addendum)
I am sorry to hear about the recent news with your mother's health.  I do not think we need to make any medication changes today.  Blood pressure is borderline but increasing exercise should help with both blood pressure as well as the blood sugar.  I will check those levels today as well.  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  See information below.  Follow-up if that becomes a more persistent issue.  Hang in there.  Temporomandibular Joint Syndrome  Temporomandibular joint syndrome (TMJ syndrome) is a condition that causes pain in the temporomandibular joints. These joints are located near your ears and allow your jaw to open and close. For people with TMJ syndrome, chewing, biting, or other movements of the jaw can be difficult or painful. TMJ syndrome is often mild and goes away within a few weeks. However, sometimes the condition becomes a long-term (chronic) problem. What are the causes? This condition may be caused by: Grinding your teeth or clenching your jaw. Some people do this when they are stressed. Arthritis. An injury to the jaw. A head or neck injury. Teeth or dentures that are not aligned well. In some cases, the cause of TMJ syndrome may not be known. What are the signs or symptoms? The most common symptom of this condition is aching pain on the side of the head in the area of the TMJ. Other symptoms may include: Pain when moving your jaw, such as when chewing or biting. Not being able to open your jaw all the way. Making a clicking sound when you open your mouth. Headache. Earache. Neck or shoulder pain. How is this diagnosed? This condition may be diagnosed based on: Your symptoms and medical history. A physical exam. Your health care provider may check the range of motion of your jaw. Imaging tests, such as X-rays or an MRI. You may also need to see your  dentist, who will check if your teeth and jaw are lined up correctly. How is this treated? TMJ syndrome often goes away on its own. If treatment is needed, it may include: Eating soft foods and applying ice or heat. Medicines to relieve pain or inflammation. Medicines or massage to relax the muscles. A splint, bite plate, or mouthpiece to prevent teeth grinding or jaw clenching. Relaxation techniques or counseling to help reduce stress. A therapy for pain in which an electrical current is applied to the nerves through the skin (transcutaneous electrical nerve stimulation). Acupuncture. This may help to relieve pain. Jaw surgery. This is rarely needed. Follow these instructions at home:  Eating and drinking Eat a soft diet if you are having trouble chewing. Avoid foods that require a lot of chewing. Do not chew gum. General instructions Take over-the-counter and prescription medicines only as told by your health care provider. If directed, put ice on the painful area. To do this: Put ice in a plastic bag. Place a towel between your skin and the bag. Leave the ice on for 20 minutes, 2-3 times a day. Remove the ice if your skin turns bright red. This is very important. If you cannot feel pain, heat, or cold, you have a greater risk of damage to the area. Apply a warm, wet cloth (warm compress) to the painful area as told. Massage your jaw area and do any jaw stretching exercises as told by your health care provider. If you were  given a splint, bite plate, or mouthpiece, wear it as told by your health care provider. Keep all follow-up visits. This is important. Where to find more information [employer]: [employer] Contact a health care provider if: You have trouble eating. You have new or worsening symptoms. Get help right away if: Your jaw locks. Summary Temporomandibular joint syndrome (TMJ syndrome) is a condition that causes pain  in the temporomandibular joints. These joints are located near your ears and allow your jaw to open and close. TMJ syndrome is often mild and goes away within a few weeks. However, sometimes the condition becomes a long-term (chronic) problem. Symptoms include an aching pain on the side of the head in the area of the TMJ, pain when chewing or biting, and being unable to open your jaw all the way. You may also make a clicking sound when you open your mouth. TMJ syndrome often goes away on its own. If treatment is needed, it may include medicines to relieve pain, reduce inflammation, or relax the muscles. A splint, bite plate, or mouthpiece may also be used to prevent teeth grinding or jaw clenching. This information is not intended to replace advice given to you by your health care provider. Make sure you discuss any questions you have with your health care provider. Document Revised: 12/30/2020 Document Reviewed: 12/30/2020 Elsevier Patient Education  2024 Elsevier Inc.    Managing Your Hypertension Hypertension, also called high blood pressure, is when the force of the blood pressing against the walls of the arteries is too strong. Arteries are blood vessels that carry blood from your heart throughout your body. Hypertension forces the heart to work harder to pump blood and may cause the arteries to become narrow or stiff. Understanding blood pressure readings A blood pressure reading includes a higher number over a lower number: The first, or top, number is called the systolic pressure. It is a measure of the pressure in your arteries as your heart beats. The second, or bottom number, is called the diastolic pressure. It is a measure of the pressure in your arteries as the heart relaxes. For most people, a normal blood pressure is below 120/80. Your personal target blood pressure may vary depending on your medical conditions, your age, and other factors. Blood pressure is classified into four  stages. Based on your blood pressure reading, your health care provider may use the following stages to determine what type of treatment you need, if any. Systolic pressure and diastolic pressure are measured in a unit called millimeters of mercury (mmHg). Normal Systolic pressure: below 120. Diastolic pressure: below 80. Elevated Systolic pressure: 120-129. Diastolic pressure: below 80. Hypertension stage 1 Systolic pressure: 130-139. Diastolic pressure: 80-89. Hypertension stage 2 Systolic pressure: 140 or above. Diastolic pressure: 90 or above. How can this condition affect me? Managing your hypertension is very important. Over time, hypertension can damage the arteries and decrease blood flow to parts of the body, including the brain, heart, and kidneys. Having untreated or uncontrolled hypertension can lead to: A heart attack. A stroke. A weakened blood vessel (aneurysm). Heart failure. Kidney damage. Eye damage. Memory and concentration problems. Vascular dementia. What actions can I take to manage this condition? Hypertension can be managed by making lifestyle changes and possibly by taking medicines. Your health care provider will help you make a plan to bring your blood pressure within a normal range. You may be referred for counseling on a healthy diet and physical activity. Nutrition  Eat a diet that is high in fiber and potassium, and low in salt (sodium), added sugar, and fat. An example eating plan is called the DASH diet. DASH stands for Dietary Approaches to Stop Hypertension. To eat this way: Eat plenty of fresh fruits and vegetables. Try to fill one-half of your plate at each meal with fruits and vegetables. Eat whole grains, such as whole-wheat pasta, brown rice, or whole-grain bread. Fill about one-fourth of your plate with whole grains. Eat low-fat dairy products. Avoid fatty cuts of meat, processed or cured meats, and poultry with skin. Fill about one-fourth of  your plate with lean proteins such as fish, chicken without skin, beans, eggs, and tofu. Avoid pre-made and processed foods. These tend to be higher in sodium, added sugar, and fat. Reduce your daily sodium intake. Many people with hypertension should eat less than 1,500 mg of sodium a day. Lifestyle  Work with your health care provider to maintain a healthy body weight or to lose weight. Ask what an ideal weight is for you. Get at least 30 minutes of exercise that causes your heart to beat faster (aerobic exercise) most days of the week. Activities may include walking, swimming, or biking. Include exercise to strengthen your muscles (resistance exercise), such as weight lifting, as part of your weekly exercise routine. Try to do these types of exercises for 30 minutes at least 3 days a week. Do not use any products that contain nicotine or tobacco. These products include cigarettes, chewing tobacco, and vaping devices, such as e-cigarettes. If you need help quitting, ask your health care provider. Control any long-term (chronic) conditions you have, such as high cholesterol or diabetes. Identify your sources of stress and find ways to manage stress. This may include meditation, deep breathing, or making time for fun activities. Alcohol use Do not drink alcohol if: Your health care provider tells you not to drink. You are pregnant, may be pregnant, or are planning to become pregnant. If you drink alcohol: Limit how much you have to: 0-1 drink a day for women. 0-2 drinks a day for men. Know how much alcohol is in your drink. In the [place] one drink equals one 12 oz bottle of beer (355 mL), one 5 oz glass of wine (148 mL), or one 1 oz glass of hard liquor (44 mL). Medicines Your health care provider may prescribe medicine if lifestyle changes are not enough to get your blood pressure under control and if: Your systolic blood pressure is 130 or higher. Your diastolic blood pressure is 80 or  higher. Take medicines only as told by your health care provider. Follow the directions carefully. Blood pressure medicines must be taken as told by your health care provider. The medicine does not work as well when you skip doses. Skipping doses also puts you at risk for problems. Monitoring Before you monitor your blood pressure: Do not smoke, drink caffeinated beverages, or exercise within 30 minutes before taking a measurement. Use the bathroom and empty your bladder (urinate). Sit quietly for at least 5 minutes before taking measurements. Monitor your blood pressure at home as told by your health care provider. To do this: Sit with your back straight and supported. Place your feet flat on the floor. Do not cross your legs. Support your arm on a flat surface, such as a table. Make sure your upper arm is at heart level. Each time you measure, take two or three readings one minute apart and record the results. You  may also need to have your blood pressure checked regularly by your health care provider. General information Talk with your health care provider about your diet, exercise habits, and other lifestyle factors that may be contributing to hypertension. Review all the medicines you take with your health care provider because there may be side effects or interactions. Keep all follow-up visits. Your health care provider can help you create and adjust your plan for managing your high blood pressure. Where to find more information National Heart, Lung, and Blood Institute: PopSteam.is American Heart Association: www.heart.org Contact a health care provider if: You think you are having a reaction to medicines you have taken. You have repeated (recurrent) headaches. You feel dizzy. You have swelling in your ankles. You have trouble with your vision. Get help right away if: You develop a severe headache or confusion. You have unusual weakness or numbness, or you feel faint. You  have severe pain in your chest or abdomen. You vomit repeatedly. You have trouble breathing. These symptoms may be an emergency. Get help right away. Call 911. Do not wait to see if the symptoms will go away. Do not drive yourself to the hospital. Summary Hypertension is when the force of blood pumping through your arteries is too strong. If this condition is not controlled, it may put you at risk for serious complications. Your personal target blood pressure may vary depending on your medical conditions, your age, and other factors. For most people, a normal blood pressure is less than 120/80. Hypertension is managed by lifestyle changes, medicines, or both. Lifestyle changes to help manage hypertension include losing weight, eating a healthy, low-sodium diet, exercising more, stopping smoking, and limiting alcohol. This information is not intended to replace advice given to you by your health care provider. Make sure you discuss any questions you have with your health care provider. Document Revised: 01/31/2021 Document Reviewed: 01/31/2021 Elsevier Patient Education  2024 [employer].

## 2023-02-18 ENCOUNTER — Ambulatory Visit: Payer: 59 | Admitting: Family Medicine

## 2023-02-18 ENCOUNTER — Ambulatory Visit (HOSPITAL_BASED_OUTPATIENT_CLINIC_OR_DEPARTMENT_OTHER)
Admission: RE | Admit: 2023-02-18 | Discharge: 2023-02-18 | Disposition: A | Discharge status: Home / Self Care | Payer: 59 | Source: Ambulatory Visit | Attending: Family Medicine | Admitting: Family Medicine

## 2023-02-18 ENCOUNTER — Encounter: Payer: Self-pay | Admitting: Family Medicine

## 2023-02-18 VITALS — BP 122/72 | HR 75 | Temp 98.4°F | Ht 64.0 in | Wt 166.5 lb

## 2023-02-18 DIAGNOSIS — R221 Localized swelling, mass and lump, neck: Secondary | ICD-10-CM

## 2023-02-18 NOTE — Patient Instructions (Addendum)
I will order an ultrasound of the neck as well as ultrasound with study of the arteries and veins in the next few days hopefully to get a better idea of this area.  I am encouraged by the improvement of symptoms and no pain, so I do not think that any specific treatment is needed at this time.  If any arm pain or swelling, swelling in veins of your arm or chest, new headache, dizziness or other new symptoms be seen but I do not expect that to happen.

## 2023-02-18 NOTE — Progress Notes (Signed)
Subjective:  Patient ID: Justin Farley, male    DOB: 09-13-1960  Age: 62 y.o. MRN: [id number]  CC:  Chief Complaint  Patient presents with   Cyst    Cyst has knot for few days. No discomfort or pain. Pt reports they have changed in size (smaller) Pt reports line under neck.     HPI Justin Farley presents for   Knot on side of neck: Left side of neck. Noted about a week ago.  No face or scalp rash, not painful.  No ear pain, or trouble swallowing. No dental caries, dental pain or recent dental procedures No fever, night sweats or weight loss. Feeling ok otherwise.  Initial bump has decreased in size.  Another bump under jaw/mid neck - about a week and a half.  Also decreased in size, but still feels area.  Regular workouts, no known injury. Jaw pain resolved. No regular HA, lightheadedness or dizziness.  No arm pain or swelling.   No treatment.  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/15/2022   Elevated PSA 08/25/2022   Internal hemorrhoids with grade 2 prolapse 11/28/2014   HTN (hypertension) 04/19/2013   GERD (gastroesophageal reflux disease) 04/19/2013   Past Medical History:  Diagnosis Date   Allergy    Colon polyps    Esophagitis    Gastritis    GERD (gastroesophageal reflux disease)    Hypertension    Internal hemorrhoids with grade 2 prolapse 11/28/2014   Past Surgical History:  Procedure Laterality Date   COLONOSCOPY     ESOPHAGOGASTRODUODENOSCOPY     HEMORRHOID BANDING     WISDOM TOOTH EXTRACTION  2009   Allergies  Allergen Reactions   Lamisil [Terbinafine]     Sharp stomach pain   Shellfish Allergy Swelling   Prior to Admission medications   Medication Sig Start Date End Date Taking? Authorizing Provider  albuterol (VENTOLIN HFA) 108 (90 Base) MCG/ACT inhaler Inhale 2 puffs into the lungs every 4 (four) hours as needed for wheezing. 10/03/19  Yes Shade Flood,  MD  hydrochlorothiazide (MICROZIDE) 12.5 MG capsule TAKE 1 CAPSULE EVERY MORNING. 08/06/22  Yes Shade Flood, MD  omeprazole (PRILOSEC) 40 MG capsule TAKE 1 CAPSULE (40 MG TOTAL) BY MOUTH DAILY.as needed 08/06/22  Yes Shade Flood, MD  sildenafil (VIAGRA) 100 MG tablet Take 0.5-1 tablets (50-100 mg total) by mouth daily as needed for erectile dysfunction. 08/06/22  Yes Shade Flood, MD  betamethasone dipropionate 0.05 % cream Apply topically daily. Patient not taking: Reported on 02/18/2023 07/17/21   Shade Flood, MD   Social History   Socioeconomic History   Marital status: Married    Spouse name: Not on file   Number of children: 4   Years of education: Not on file   Highest education level: Not on file  Occupational History   Occupation: Estate agent   Occupation: supervisor    Comment: [employer]  Tobacco Use   Smoking status: Never   Smokeless tobacco: Never  Vaping Use   Vaping status: Never Used  Substance and Sexual Activity   Alcohol use: Yes    Comment: special occasions   Drug use: No   Sexual activity: Yes    Birth control/protection: Condom  Other Topics Concern   Not on file  Social History Narrative   Single. Education: [employer]. Exercise: 3 days a week for 2 hours.   Social  Determinants of Health   Financial Resource Strain: Not on file  Food Insecurity: Not on file  Transportation Needs: Not on file  Physical Activity: Not on file  Stress: Not on file  Social Connections: Unknown (10/11/2021)   Received from [facility]   Social Network    Social Network: Not on file  Intimate Partner Violence: Unknown (09/02/2021)   Received from [facility], [facility]   HITS    Physically Hurt: Not on file    Insult or Talk Down To: Not on file    Threaten Physical Harm: Not on file    Scream or Curse: Not on file    Review of Systems   Objective:   Vitals:   02/18/23 1513  BP: 122/72  Pulse: 75  Temp: 98.4 F  (36.9 C)  Weight: 166 lb 8 oz (75.5 kg)  Height: 5\' 4"  (1.626 m)     Physical Exam Vitals reviewed.  Constitutional:      Appearance: He is well-developed.  HENT:     Head: Normocephalic and atraumatic.  Neck:     Vascular: No carotid bruit or JVD.   Cardiovascular:     Rate and Rhythm: Normal rate and regular rhythm.     Heart sounds: Normal heart sounds. No murmur heard. Pulmonary:     Effort: Pulmonary effort is normal.     Breath sounds: Normal breath sounds. No rales.  Musculoskeletal:     Right lower leg: No edema.     Left lower leg: No edema.     Comments: No arm swelling, no chest rash or swollen veins appreciated within chest, other areas of neck or arm.  Cap refill less than 1 second fingertips, hand veins flat with arm elevation.  Skin:    General: Skin is warm and dry.  Neurological:     Mental Status: He is alert and oriented to person, place, and time.  Psychiatric:        Mood and Affect: Mood normal.        Assessment & Plan:  Justin Farley is a 62 y.o. male . Localized swelling, mass and lump, neck - Plan: US Soft Tissue Head/Neck (NON-THYROID), US Carotid Bilateral, CANCELED: US Carotid Bilateral Possible cyst or skin lesion in the mid neck, improving.  Does not appear to be submental lymphadenopathy, no appreciable lymphadenopathy otherwise.  Area of left neck is linear, and an area of external jugular vein, possible external jugular thrombosis.  Reports that area is improving, no pain, no other vein swelling.  No known injury.  Check imaging with ultrasound initially, then may need to discuss with vascular, but with improving symptoms we will start with outpatient evaluation.  RTC/ER precautions given.  No orders of the defined types were placed in this encounter.  Patient Instructions  I will order an ultrasound of the neck as well as ultrasound with study of the arteries and veins in the next few days hopefully to get a better idea of this  area.  I am encouraged by the improvement of symptoms and no pain, so I do not think that any specific treatment is needed at this time.  If any arm pain or swelling, swelling in veins of your arm or chest, new headache, dizziness or other new symptoms be seen but I do not expect that to happen.      Signed,   Meredith Staggers, MD [facility], [facility] 02/18/23 4:27 PM

## 2023-03-19 ENCOUNTER — Ambulatory Visit (INDEPENDENT_AMBULATORY_CARE_PROVIDER_SITE_OTHER): Payer: 59 | Admitting: Urology

## 2023-03-19 ENCOUNTER — Encounter: Payer: Self-pay | Admitting: Urology

## 2023-03-19 VITALS — BP 135/90 | HR 66 | Ht 64.0 in | Wt 160.0 lb

## 2023-03-19 DIAGNOSIS — R972 Elevated prostate specific antigen [PSA]: Secondary | ICD-10-CM | POA: Diagnosis not present

## 2023-03-19 DIAGNOSIS — N4232 Atypical small acinar proliferation of prostate: Secondary | ICD-10-CM

## 2023-03-19 NOTE — Progress Notes (Signed)
Assessment: 1. Elevated PSA   2. Atypical small acinar proliferation of prostate     Plan: Given ASAP, recommend close monitoring of PSA and MRI of prostate PSA today MRI of prostate ordered - will call with results Return to office in 4 months  Chief Complaint:  Chief Complaint  Patient presents with   Elevated PSA   History of Present Illness:  Justin Farley is a 62 y.o. male who is seen for continued evaluation of elevated PSA and ASAP on prostate biopsy.  PSA results: 9/14 2.55 8/16 2.66 8/17 4.0 10/16 3.8 5/19 3.9 10/19 3.9 11/20 3.5 2/23 3.97 3/24 4.38   No history of prostate biopsy.  No history of UTIs or prostatitis.  He has a family history of prostate cancer in a maternal uncle.  No dysuria or gross hematuria. IPSS = 2.   He was previously seen in 12/23 for a vasectomy evaluation.   He is married with children.  His wife is 58 and may be peri or post menopausal.  No history of scrotal trauma or infection.   He underwent a transrectal ultrasound and biopsy of the prostate on 09/09/22. 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    IPSS = 2 Patient without erectile dysfunction.    isoPSA from 7/24:  PSA 3.79  He presents today for follow-up.  No new lower urinary tract symptoms.  No dysuria or gross hematuria. IPSS = 2.  Portions of the above documentation were copied from a prior visit for review purposes only.     Past Medical History:  Past Medical History:  Diagnosis Date   Allergy    Colon polyps    Esophagitis    Gastritis    GERD (gastroesophageal reflux disease)    Hypertension    Internal hemorrhoids with grade 2 prolapse 11/28/2014    Past Surgical History:  Past Surgical History:   Procedure Laterality Date   COLONOSCOPY     ESOPHAGOGASTRODUODENOSCOPY     HEMORRHOID BANDING     WISDOM TOOTH EXTRACTION  2009    Allergies:  Allergies  Allergen Reactions   Lamisil [Terbinafine]     Sharp stomach pain   Shellfish Allergy Swelling    Family History:  Family History  Problem Relation Age of Onset   Hypertension Mother    Hypertension Sister    Diabetes Brother    Prostate cancer Maternal Uncle    Colon cancer Neg Hx    Esophageal cancer Neg Hx    Rectal cancer Neg Hx    Stomach cancer Neg Hx     Social History:  Social History   Tobacco Use   Smoking status: Never   Smokeless tobacco: Never  Vaping Use   Vaping status: Never Used  Substance Use Topics   Alcohol use: Yes    Comment: special occasions   Drug use: No    ROS: Constitutional:  Negative for fever, chills, weight loss CV: Negative for chest pain, previous MI, hypertension Respiratory:  Negative for shortness of breath, wheezing, sleep apnea, frequent cough GI:  Negative for nausea, vomiting, bloody stool, GERD  Physical exam: BP (!) 145/97   Pulse 77  Ht 5\' 4"  (1.626 m)   Wt 160 lb (72.6 kg)   BMI 27.46 kg/m  GENERAL APPEARANCE:  Well appearing, well developed, well nourished, NAD HEENT:  Atraumatic, normocephalic, oropharynx clear NECK:  Supple without lymphadenopathy or thyromegaly ABDOMEN:  Soft, non-tender, no masses EXTREMITIES:  Moves all extremities well, without clubbing, cyanosis, or edema NEUROLOGIC:  Alert and oriented x 3, normal gait, CN II-XII grossly intact MENTAL STATUS:  appropriate BACK:  Non-tender to palpation, No CVAT SKIN:  Warm, dry, and intact  Results: U/A: 0-2 RBCs

## 2023-03-20 LAB — PSA: Prostate Specific Ag, Serum: 3.6 ng/mL (ref 0.0–4.0)

## 2023-03-23 ENCOUNTER — Encounter: Payer: Self-pay | Admitting: Urology

## 2023-03-25 LAB — URINALYSIS, ROUTINE W REFLEX MICROSCOPIC
Bilirubin, UA: NEGATIVE
Glucose, UA: NEGATIVE
Ketones, UA: NEGATIVE
Leukocytes,UA: NEGATIVE
Nitrite, UA: NEGATIVE
Protein,UA: NEGATIVE
Specific Gravity, UA: 1.015 (ref 1.005–1.030)
Urobilinogen, Ur: 0.2 mg/dL (ref 0.2–1.0)
pH, UA: 7 (ref 5.0–7.5)

## 2023-03-25 LAB — MICROSCOPIC EXAMINATION

## 2023-04-27 ENCOUNTER — Other Ambulatory Visit: Payer: Self-pay | Admitting: Family Medicine

## 2023-04-27 DIAGNOSIS — N529 Male erectile dysfunction, unspecified: Secondary | ICD-10-CM

## 2023-05-08 ENCOUNTER — Ambulatory Visit
Admission: RE | Admit: 2023-05-08 | Discharge: 2023-05-08 | Disposition: A | Discharge status: Home / Self Care | Payer: 59 | Source: Ambulatory Visit | Attending: Urology | Admitting: Urology

## 2023-05-08 DIAGNOSIS — N4232 Atypical small acinar proliferation of prostate: Secondary | ICD-10-CM

## 2023-05-08 DIAGNOSIS — R972 Elevated prostate specific antigen [PSA]: Secondary | ICD-10-CM

## 2023-05-08 MED ORDER — GADOPICLENOL 0.5 MMOL/ML IV SOLN
7.0000 mL | Freq: Once | INTRAVENOUS | Status: AC | PRN
  Administered 2023-05-08: 7 mL via INTRAVENOUS

## 2023-05-11 ENCOUNTER — Encounter: Payer: Self-pay | Admitting: Urology

## 2023-07-06 ENCOUNTER — Ambulatory Visit: Payer: 59 | Admitting: Family Medicine

## 2023-07-13 ENCOUNTER — Ambulatory Visit: Payer: 59 | Admitting: Family Medicine

## 2023-07-13 ENCOUNTER — Encounter: Payer: Self-pay | Admitting: Family Medicine

## 2023-07-13 VITALS — BP 122/80 | HR 74 | Temp 98.6°F | Ht 64.0 in | Wt 168.4 lb

## 2023-07-13 DIAGNOSIS — R221 Localized swelling, mass and lump, neck: Secondary | ICD-10-CM

## 2023-07-13 DIAGNOSIS — R21 Rash and other nonspecific skin eruption: Secondary | ICD-10-CM

## 2023-07-13 NOTE — Patient Instructions (Signed)
 As we discussed it would be unlikely that you had a reaction to the contrast or IV from MRI as typically we would expect to see a reaction within a few hours to days, not weeks.  There may be an eczema component and sometimes I will see that in the fold of the elbow.  Glad to hear it is improving, but you can try over-the-counter Eucerin lotion, hydrocortisone  cream, and then follow-up in the next 4 to 6 weeks if not resolved.  Sooner if spreading or worsening.  The area on the back your neck appears to be a possible lipoma.  Those are usually benign.  Call your dermatologist for appointment and have them evaluate the area as well.  Thanks for coming in today and let me know if there are questions.

## 2023-07-13 NOTE — Progress Notes (Signed)
 Subjective:  Patient ID: Justin Farley, male    DOB: 03-22-1961  Age: 63 y.o. MRN: [id number]  CC:  Chief Complaint  Patient presents with   Rash    Pt had an MRI in December and notes he thinks they used contrast dye, and notes he had a rash come up and has some tenderness to the area no redness or itching.     Mass    Notes a knot on the back of his neck for a few months, notes no pain or other concern this was a visual observation     HPI Justin Farley presents for 2-3 concerns as above  Rash R antecubital/fold of arm. Past month or so Noticed few weeks after MRI for prostate in December ( no signs of high grade prostate CA), some soreness on inside of elbow. He did have IV in arm for MRI, but no initial pain or swelling in that area afterwards. Rash a few weeks later.  No other areas of rash - no mouth/genital lesions. No change in exercise/weights/reps. Still lifting weights.  Tx: none. Improving - starting to disappear. No pain.    Mass of posterior neck Noticed past few months. Sister noticed recently. No pain. No bleeding or d/c. No recent change in size.  Last derm visit  - last year for mole on face. Has not had neck area evaluated.     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/15/2022   Elevated PSA 08/25/2022   Internal hemorrhoids with grade 2 prolapse 11/28/2014   HTN (hypertension) 04/19/2013   GERD (gastroesophageal reflux disease) 04/19/2013   Past Medical History:  Diagnosis Date   Allergy    Colon polyps    Esophagitis    Gastritis    GERD (gastroesophageal reflux disease)    Hypertension    Internal hemorrhoids with grade 2 prolapse 11/28/2014   Past Surgical History:  Procedure Laterality Date   COLONOSCOPY     ESOPHAGOGASTRODUODENOSCOPY     HEMORRHOID BANDING     WISDOM TOOTH EXTRACTION  2009   Allergies  Allergen Reactions   Lamisil  [Terbinafine ]     Sharp  stomach pain   Shellfish Allergy Swelling   Prior to Admission medications   Medication Sig Start Date End Date Taking? Authorizing Provider  albuterol  (VENTOLIN  HFA) 108 (90 Base) MCG/ACT inhaler Inhale 2 puffs into the lungs every 4 (four) hours as needed for wheezing. 10/03/19   Benjiman Bras, MD  betamethasone  dipropionate 0.05 % cream Apply topically daily. Patient not taking: Reported on 02/18/2023 07/17/21   Benjiman Bras, MD  hydrochlorothiazide  (MICROZIDE ) 12.5 MG capsule TAKE 1 CAPSULE EVERY MORNING. 08/06/22   Benjiman Bras, MD  omeprazole  (PRILOSEC) 40 MG capsule TAKE 1 CAPSULE (40 MG TOTAL) BY MOUTH DAILY.as needed 08/06/22   Benjiman Bras, MD  sildenafil  (VIAGRA ) 100 MG tablet TAKE 0.5-1 TABLETS BY MOUTH DAILY AS NEEDED FOR ERECTILE DYSFUNCTION. 04/28/23   Benjiman Bras, MD   Social History   Socioeconomic History   Marital status: Married    Spouse name: Not on file   Number of children: 4   Years of education: Not on file   Highest education level: Not on file  Occupational History   Occupation: Estate agent   Occupation: supervisor    Comment: [employer]  Tobacco Use   Smoking status: Never   Smokeless tobacco: Never  Vaping Use  Vaping status: Never Used  Substance and Sexual Activity   Alcohol use: Yes    Comment: special occasions   Drug use: No   Sexual activity: Yes    Birth control/protection: Condom  Other Topics Concern   Not on file  Social History Narrative   Single. Education: [employer]. Exercise: 3 days a week for 2 hours.   Social Drivers of Corporate investment banker Strain: Not on file  Food Insecurity: Not on file  Transportation Needs: Not on file  Physical Activity: Not on file  Stress: Not on file  Social Connections: Unknown (10/11/2021)   Received from [facility], [facility]   Social Network    Social Network: Not on file  Intimate Partner Violence: Unknown (09/02/2021)   Received from [facility],  [facility]   HITS    Physically Hurt: Not on file    Insult or Talk Down To: Not on file    Threaten Physical Harm: Not on file    Scream or Curse: Not on file    Review of Systems Per HPI  Objective:   Vitals:   07/13/23 1346  BP: 122/80  Pulse: 74  Temp: 98.6 F (37 C)  TempSrc: Temporal  SpO2: 99%  Weight: 168 lb 6.4 oz (76.4 kg)  Height: 5\' 4"  (1.626 m)     Physical Exam Vitals reviewed.  Constitutional:      General: He is not in acute distress.    Appearance: Normal appearance. He is well-developed.  HENT:     Head: Normocephalic and atraumatic.  Cardiovascular:     Rate and Rhythm: Normal rate.  Pulmonary:     Effort: Pulmonary effort is normal.  Musculoskeletal:     Comments: Pain-free range of motion of right elbow.  No focal bony tenderness at this time but notes previous discomfort at the medial epicondyle.  Skin:    Comments: Right elbow, antecubital fossa with slight erythematous linear areas of rash, see photo.  No vascular streaks.  No indurated or nodular/cord areas.  Nontender.  No ecchymosis.  No open wounds.  Right posterior neck, soft approximately 2 cm lesion just under the skin, nontender.  See photo  Neurological:     Mental Status: He is alert and oriented to person, place, and time.  Psychiatric:        Mood and Affect: Mood normal.         Assessment & Plan:  Justin Farley is a 63 y.o. male . Rash  -Unlikely reaction to IV or MRI contrast as symptoms should have started within a few hours to days, now weeks later.  Unlikely superficial thrombophlebitis without swelling, pain or nodularity.  Appears to have possible eczematous rash, or contact dermatitis, but that is improving.  Symptomatic care with topical steroid, hydrating lotion with RTC precautions if not improving.    Localized swelling, mass or lump of neck Approximately 2 cm area on the right posterior neck without recent changes, asymptomatic.  Possible lipoma.  He  has seen dermatology, recommended follow-up for second opinion of that area.  No orders of the defined types were placed in this encounter.  Patient Instructions  As we discussed it would be unlikely that you had a reaction to the contrast or IV from MRI as typically we would expect to see a reaction within a few hours to days, not weeks.  There may be an eczema component and sometimes I will see that in the fold of the  elbow.  Glad to hear it is improving, but you can try over-the-counter Eucerin lotion, hydrocortisone  cream, and then follow-up in the next 4 to 6 weeks if not resolved.  Sooner if spreading or worsening.  The area on the back your neck appears to be a possible lipoma.  Those are usually benign.  Call your dermatologist for appointment and have them evaluate the area as well.  Thanks for coming in today and let me know if there are questions.    Signed,   Caro Christmas, MD [facility], [facility] 07/13/23 2:24 PM

## 2023-07-20 ENCOUNTER — Ambulatory Visit: Payer: 59 | Admitting: Urology

## 2023-07-21 ENCOUNTER — Encounter: Payer: Self-pay | Admitting: Urology

## 2023-07-21 ENCOUNTER — Ambulatory Visit: Payer: 59 | Admitting: Urology

## 2023-07-21 VITALS — BP 143/89 | HR 66 | Ht 64.0 in | Wt 164.0 lb

## 2023-07-21 DIAGNOSIS — R972 Elevated prostate specific antigen [PSA]: Secondary | ICD-10-CM

## 2023-07-21 DIAGNOSIS — N4232 Atypical small acinar proliferation of prostate: Secondary | ICD-10-CM | POA: Diagnosis not present

## 2023-07-21 NOTE — Progress Notes (Signed)
Assessment: 1. Elevated PSA   2. Atypical small acinar proliferation of prostate     Plan: 4K PSA today - will contact him with results Return to office in 6 months  Chief Complaint:  Chief Complaint  Patient presents with   Elevated PSA   History of Present Illness:  Justin Farley is a 63 y.o. male who is seen for continued evaluation of elevated PSA and ASAP on prostate biopsy.  PSA results: 9/14 2.55 8/16 2.66 8/17 4.0 10/16 3.8 5/19 3.9 10/19 3.9 11/20 3.5 2/23 3.97 3/24 4.38   No history of prostate biopsy.  No history of UTIs or prostatitis.  He has a family history of prostate cancer in a maternal uncle.  No dysuria or gross hematuria. IPSS = 2.   He was previously seen in 12/23 for a vasectomy evaluation.   He is married with children.  His wife is 15 and may be peri or post menopausal.  No history of scrotal trauma or infection.   He underwent a transrectal ultrasound and biopsy of the prostate on 09/09/22. 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    IPSS = 2 Patient without erectile dysfunction.    isoPSA from 7/24:  PSA 3.79 PSA 10/24:  3.6  Prostate MRI from 05/08/2023 showed no focal lesion of intermediate or higher suspicion for prostate cancer, BPH with a volume of 64 cm.  He returns today for follow-up.  No new urinary symptoms.  No dysuria or gross hematuria. IPSS = 2. He continues to use sildenafil on occasion for erectile dysfunction.  Portions of the above documentation were copied from a prior visit for review purposes only.     Past Medical History:  Past Medical History:  Diagnosis Date   Allergy    Colon polyps    Esophagitis    Gastritis    GERD (gastroesophageal reflux  disease)    Hypertension    Internal hemorrhoids with grade 2 prolapse 11/28/2014    Past Surgical History:  Past Surgical History:  Procedure Laterality Date   COLONOSCOPY     ESOPHAGOGASTRODUODENOSCOPY     HEMORRHOID BANDING     WISDOM TOOTH EXTRACTION  2009    Allergies:  Allergies  Allergen Reactions   Lamisil [Terbinafine]     Sharp stomach pain   Shellfish Allergy Swelling    Family History:  Family History  Problem Relation Age of Onset   Hypertension Mother    Hypertension Sister    Diabetes Brother    Prostate cancer Maternal Uncle    Colon cancer Neg Hx    Esophageal cancer Neg Hx    Rectal cancer Neg Hx    Stomach cancer Neg Hx     Social History:  Social History   Tobacco Use   Smoking status: Never   Smokeless tobacco: Never  Vaping Use   Vaping status: Never Used  Substance Use Topics   Alcohol use: Yes    Comment: special occasions   Drug use: No    ROS: Constitutional:  Negative for fever, chills, weight loss CV: Negative for chest pain, previous MI, hypertension Respiratory:  Negative for shortness of breath, wheezing,  sleep apnea, frequent cough GI:  Negative for nausea, vomiting, bloody stool, GERD  Physical exam: BP (!) 143/89   Pulse 66   Ht 5\' 4"  (1.626 m)   Wt 164 lb (74.4 kg)   BMI 28.15 kg/m  GENERAL APPEARANCE:  Well appearing, well developed, well nourished, NAD HEENT:  Atraumatic, normocephalic, oropharynx clear NECK:  Supple without lymphadenopathy or thyromegaly ABDOMEN:  Soft, non-tender, no masses EXTREMITIES:  Moves all extremities well, without clubbing, cyanosis, or edema NEUROLOGIC:  Alert and oriented x 3, normal gait, CN II-XII grossly intact MENTAL STATUS:  appropriate BACK:  Non-tender to palpation, No CVAT SKIN:  Warm, dry, and intact  Results: U/A: Negative

## 2023-07-22 ENCOUNTER — Ambulatory Visit: Payer: 59 | Admitting: Urology

## 2023-07-23 LAB — URINALYSIS
Bilirubin, UA: NEGATIVE
Glucose, UA: NEGATIVE
Ketones, UA: NEGATIVE
Leukocytes,UA: NEGATIVE
Nitrite, UA: NEGATIVE
Protein,UA: NEGATIVE
RBC, UA: NEGATIVE
Specific Gravity, UA: 1.015 (ref 1.005–1.030)
Urobilinogen, Ur: 0.2 mg/dL (ref 0.2–1.0)
pH, UA: 7 (ref 5.0–7.5)

## 2023-07-27 ENCOUNTER — Encounter: Payer: Self-pay | Admitting: Urology

## 2023-07-29 ENCOUNTER — Encounter: Payer: Self-pay | Admitting: Urology

## 2023-08-13 ENCOUNTER — Encounter: Payer: 59 | Admitting: Family Medicine

## 2023-09-03 ENCOUNTER — Ambulatory Visit: Admitting: Family Medicine

## 2023-09-03 VITALS — BP 112/60 | HR 82 | Temp 99.0°F | Ht 64.25 in | Wt 165.8 lb

## 2023-09-03 DIAGNOSIS — I10 Essential (primary) hypertension: Secondary | ICD-10-CM | POA: Diagnosis not present

## 2023-09-03 DIAGNOSIS — Z1329 Encounter for screening for other suspected endocrine disorder: Secondary | ICD-10-CM | POA: Diagnosis not present

## 2023-09-03 DIAGNOSIS — Z13220 Encounter for screening for lipoid disorders: Secondary | ICD-10-CM | POA: Diagnosis not present

## 2023-09-03 DIAGNOSIS — Z0000 Encounter for general adult medical examination without abnormal findings: Secondary | ICD-10-CM

## 2023-09-03 DIAGNOSIS — Z23 Encounter for immunization: Secondary | ICD-10-CM

## 2023-09-03 DIAGNOSIS — R972 Elevated prostate specific antigen [PSA]: Secondary | ICD-10-CM | POA: Diagnosis not present

## 2023-09-03 DIAGNOSIS — R7303 Prediabetes: Secondary | ICD-10-CM

## 2023-09-03 NOTE — Patient Instructions (Signed)
 Sorry to hear about your mom passing.  I do think meeting with a therapist or counselor may be helpful, but please let me know if any resources needed for me or if you need some names.  Hang in there.  If any concerns on your labs I will let you know.  Pneumonia vaccine was given today, you appear to be up-to-date on other vaccines.  Let me know if there are questions and take care.   Preventive Care 71-61 Years Old, Male Preventive care refers to lifestyle choices and visits with your health care provider that can promote health and wellness. Preventive care visits are also called wellness exams. What can I expect for my preventive care visit? Counseling During your preventive care visit, your health care provider may ask about your: Medical history, including: Past medical problems. Family medical history. Current health, including: Emotional well-being. Home life and relationship well-being. Sexual activity. Lifestyle, including: Alcohol, nicotine or tobacco, and drug use. Access to firearms. Diet, exercise, and sleep habits. Safety issues such as seatbelt and bike helmet use. Sunscreen use. Work and work Astronomer. Physical exam Your health care provider will check your: Height and weight. These may be used to calculate your BMI (body mass index). BMI is a measurement that tells if you are at a healthy weight. Waist circumference. This measures the distance around your waistline. This measurement also tells if you are at a healthy weight and may help predict your risk of certain diseases, such as type 2 diabetes and high blood pressure. Heart rate and blood pressure. Body temperature. Skin for abnormal spots. What immunizations do I need?  Vaccines are usually given at various ages, according to a schedule. Your health care provider will recommend vaccines for you based on your age, medical history, and lifestyle or other factors, such as travel or where you work. What tests do  I need? Screening Your health care provider may recommend screening tests for certain conditions. This may include: Lipid and cholesterol levels. Diabetes screening. This is done by checking your blood sugar (glucose) after you have not eaten for a while (fasting). Hepatitis B test. Hepatitis C test. HIV (human immunodeficiency virus) test. STI (sexually transmitted infection) testing, if you are at risk. Lung cancer screening. Prostate cancer screening. Colorectal cancer screening. Talk with your health care provider about your test results, treatment options, and if necessary, the need for more tests. Follow these instructions at home: Eating and drinking  Eat a diet that includes fresh fruits and vegetables, whole grains, lean protein, and low-fat dairy products. Take vitamin and mineral supplements as recommended by your health care provider. Do not drink alcohol if your health care provider tells you not to drink. If you drink alcohol: Limit how much you have to 0-2 drinks a day. Know how much alcohol is in your drink. In the [place] one drink equals one 12 oz bottle of beer (355 mL), one 5 oz glass of wine (148 mL), or one 1 oz glass of hard liquor (44 mL). Lifestyle Brush your teeth every morning and night with fluoride toothpaste. Floss one time each day. Exercise for at least 30 minutes 5 or more days each week. Do not use any products that contain nicotine or tobacco. These products include cigarettes, chewing tobacco, and vaping devices, such as e-cigarettes. If you need help quitting, ask your health care provider. Do not use drugs. If you are sexually active, practice safe sex. Use a condom or other form of protection to  prevent STIs. Take aspirin only as told by your health care provider. Make sure that you understand how much to take and what form to take. Work with your health care provider to find out whether it is safe and beneficial for you to take aspirin daily. Find  healthy ways to manage stress, such as: Meditation, yoga, or listening to music. Journaling. Talking to a trusted person. Spending time with friends and family. Minimize exposure to UV radiation to reduce your risk of skin cancer. Safety Always wear your seat belt while driving or riding in a vehicle. Do not drive: If you have been drinking alcohol. Do not ride with someone who has been drinking. When you are tired or distracted. While texting. If you have been using any mind-altering substances or drugs. Wear a helmet and other protective equipment during sports activities. If you have firearms in your house, make sure you follow all gun safety procedures. What's next? Go to your health care provider once a year for an annual wellness visit. Ask your health care provider how often you should have your eyes and teeth checked. Stay up to date on all vaccines. This information is not intended to replace advice given to you by your health care provider. Make sure you discuss any questions you have with your health care provider. Document Revised: 11/14/2020 Document Reviewed: 11/14/2020 Elsevier Patient Education  2024 [employer].

## 2023-09-03 NOTE — Progress Notes (Unsigned)
 Subjective:  Patient ID: Justin Farley, male    DOB: 1960/10/28  Age: 63 y.o. MRN: [id number]  CC:  Chief Complaint  Patient presents with   Annual Exam    Pt does not have questions or concerns, pt has been fasting for 4 hours     HPI [name] presents for Annual Exam  Doing ok overall.  Mom passed a month ago. Renal cancer. He is doing ok - moment by moment.  Considering seeing a therapist. Has support system. Has been talking with pastor.   PCP: me Urology: Pete Glatter - for elevated PSA - recent visit in February, MRI ok. Repeat OV in 01/2024.   Prediabetes: Diet/exercise approach. Lab Results  Component Value Date   HGBA1C 6.1 02/11/2023   Wt Readings from Last 3 Encounters:  09/03/23 165 lb 12.8 oz (75.2 kg)  07/21/23 164 lb (74.4 kg)  07/13/23 168 lb 6.4 oz (76.4 kg)     Hypertension: Hydrochlorothiazide 12.5mg  every day.  Home readings: BP Readings from Last 3 Encounters:  09/03/23 112/60  07/21/23 (!) 143/89  07/13/23 122/80   Lab Results  Component Value Date   CREATININE 1.18 02/11/2023   GERD: Stable with every other day dosing omeprazole.   Erectile dysfunction: Viagra 50mg  effective.  No hearing/vision changes, no DOE/CP. No ha or flushing.        09/03/2023    4:03 PM 07/13/2023    1:48 PM 02/11/2023    9:32 AM 08/06/2022    8:04 AM 05/16/2022    9:20 AM  Depression screen PHQ 2/9  Decreased Interest 0 0 0 0 0  Down, Depressed, Hopeless 0 0 0 0 0  PHQ - 2 Score 0 0 0 0 0  Altered sleeping 0 0 0 0 0  Tired, decreased energy 0 0 0 0 0  Change in appetite 0 0 0 0 0  Feeling bad or failure about yourself  0 0 0 0 0  Trouble concentrating 0 0 0 0 0  Moving slowly or fidgety/restless 0 0 0 0 0  Suicidal thoughts 0 0 0 0 0  PHQ-9 Score 0 0 0 0 0    Health Maintenance  Topic Date Due   COVID-19 Vaccine (6 - 2024-25 season) 02/01/2023   INFLUENZA VACCINE  01/01/2024   DTaP/Tdap/Td (2 - Td or Tdap) 03/28/2027   Colonoscopy   01/08/2031   Hepatitis C Screening  Completed   HIV Screening  Completed   Zoster Vaccines- Shingrix  Completed   Pneumococcal Vaccine 36-63 Years old  Aged Out   HPV VACCINES  Aged Out  Colonoscopy 01/07/21 -- repeat 10 yrs.  Prostate: followed by urology above.  Lab Results  Component Value Date   PSA1 3.6 03/19/2023   PSA1 3.5 04/08/2019   PSA1 3.9 03/30/2018   PSA 4.38 (H) 08/06/2022   PSA 3.97 07/17/2021   PSA 4.0 01/31/2016     Immunization History  Administered Date(s) Administered   Influenza Inj Mdck Quad Pf 03/06/2020   Influenza, Quadrivalent, Recombinant, Inj, Pf 05/29/2021   Influenza, Seasonal, Injecte, Preservative Fre 02/11/2023   Influenza,inj,Quad PF,6+ Mos 03/14/2013, 03/27/2017, 03/30/2018, 03/14/2019, 01/27/2022   Influenza-Unspecified 03/02/2014, 03/03/2015   PFIZER(Purple Top)SARS-COV-2 Vaccination 08/27/2019, 09/17/2019, 03/25/2020, 09/08/2020   PNEUMOCOCCAL CONJUGATE-20 09/03/2023   Pfizer Covid-19 Vaccine Bivalent Booster 62yrs & up 03/25/2021   Tdap 03/27/2017   Zoster Recombinant(Shingrix) 04/04/2019, 06/04/2019  Prevnar 20 today   No results found. In past year - 1 month ago -  stable.   Dental: last week.   Alcohol: rare.   Tobacco: none  Exercise: workouts 3-4 days per week, 1.5-2 hrs. Cardio and weights.   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/15/2022   Elevated PSA 08/25/2022   Internal hemorrhoids with grade 2 prolapse 11/28/2014   HTN (hypertension) 04/19/2013   GERD (gastroesophageal reflux disease) 04/19/2013   Past Medical History:  Diagnosis Date   Allergy    Colon polyps    Esophagitis    Gastritis    GERD (gastroesophageal reflux disease)    Hypertension    Internal hemorrhoids with grade 2 prolapse 11/28/2014   Past Surgical History:  Procedure Laterality Date   COLONOSCOPY     ESOPHAGOGASTRODUODENOSCOPY     HEMORRHOID BANDING     WISDOM  TOOTH EXTRACTION  2009   Allergies  Allergen Reactions   Lamisil [Terbinafine]     Sharp stomach pain   Shellfish Allergy Swelling   Prior to Admission medications   Medication Sig Start Date End Date Taking? Authorizing Provider  albuterol (VENTOLIN HFA) 108 (90 Base) MCG/ACT inhaler Inhale 2 puffs into the lungs every 4 (four) hours as needed for wheezing. 10/03/19  Yes Shade Flood, MD  betamethasone dipropionate 0.05 % cream Apply topically daily. 07/17/21  Yes Shade Flood, MD  hydrochlorothiazide (MICROZIDE) 12.5 MG capsule TAKE 1 CAPSULE EVERY MORNING. 08/06/22  Yes Shade Flood, MD  omeprazole (PRILOSEC) 40 MG capsule TAKE 1 CAPSULE (40 MG TOTAL) BY MOUTH DAILY.as needed 08/06/22  Yes Shade Flood, MD  sildenafil (VIAGRA) 100 MG tablet TAKE 0.5-1 TABLETS BY MOUTH DAILY AS NEEDED FOR ERECTILE DYSFUNCTION. 04/28/23  Yes Shade Flood, MD   Social History   Socioeconomic History   Marital status: Married    Spouse name: Not on file   Number of children: 4   Years of education: Not on file   Highest education level: Not on file  Occupational History   Occupation: Estate agent   Occupation: supervisor    Comment: [employer]  Tobacco Use   Smoking status: Never   Smokeless tobacco: Never  Vaping Use   Vaping status: Never Used  Substance and Sexual Activity   Alcohol use: Yes    Comment: special occasions   Drug use: No   Sexual activity: Yes    Birth control/protection: Condom  Other Topics Concern   Not on file  Social History Narrative   Single. Education: [employer]. Exercise: 3 days a week for 2 hours.   Social Drivers of Corporate investment banker Strain: Not on file  Food Insecurity: Not on file  Transportation Needs: Not on file  Physical Activity: Not on file  Stress: Not on file  Social Connections: Unknown (10/11/2021)   Received from [facility], [facility]   Social Network    Social Network: Not on file  Intimate  Partner Violence: Unknown (09/02/2021)   Received from [facility], [facility]   HITS    Physically Hurt: Not on file    Insult or Talk Down To: Not on file    Threaten Physical Harm: Not on file    Scream or Curse: Not on file    Review of Systems  13 point review of systems per patient health survey noted.  Negative other than as indicated above or in HPI.   Objective:   Vitals:   09/03/23 1604  BP: 112/60  Pulse: 82  Temp: 99 F (37.2 C)  TempSrc: Temporal  SpO2: 97%  Weight: 165 lb 12.8 oz (75.2 kg)  Height: 5' 4.25" (1.632 m)   {Vitals History (Optional):23777}  Physical Exam Vitals reviewed.  Constitutional:      Appearance: He is well-developed.  HENT:     Head: Normocephalic and atraumatic.     Right Ear: External ear normal.     Left Ear: External ear normal.  Eyes:     Conjunctiva/sclera: Conjunctivae normal.     Pupils: Pupils are equal, round, and reactive to light.  Neck:     Thyroid: No thyromegaly.  Cardiovascular:     Rate and Rhythm: Normal rate and regular rhythm.     Heart sounds: Normal heart sounds.  Pulmonary:     Effort: Pulmonary effort is normal. No respiratory distress.     Breath sounds: Normal breath sounds. No wheezing.  Abdominal:     General: There is no distension.     Palpations: Abdomen is soft.     Tenderness: There is no abdominal tenderness.  Musculoskeletal:        General: No tenderness. Normal range of motion.     Cervical back: Normal range of motion and neck supple.  Lymphadenopathy:     Cervical: No cervical adenopathy.  Skin:    General: Skin is warm and dry.  Neurological:     Mental Status: He is alert and oriented to person, place, and time.     Deep Tendon Reflexes: Reflexes are normal and symmetric.  Psychiatric:        Behavior: Behavior normal.     Assessment & Plan:  JAZIER MCGLAMERY is a 63 y.o. male . Annual physical exam  Prediabetes - Plan: HgB A1c  Essential hypertension - Plan: CBC  with Differential/Platelet, Comprehensive metabolic panel with GFR  Elevated PSA - Plan: CANCELED: PSA  Screening for thyroid disorder - Plan: TSH  Screening for hyperlipidemia - Plan: Lipid panel  Need for pneumococcal vaccination - Plan: Pneumococcal conjugate vaccine 20-valent (Prevnar 20)   No orders of the defined types were placed in this encounter.  Patient Instructions  Sorry to hear about your mom passing.  I do think meeting with a therapist or counselor may be helpful, but please let me know if any resources needed for me or if you need some names.  Hang in there.  If any concerns on your labs I will let you know.  Pneumonia vaccine was given today, you appear to be up-to-date on other vaccines.  Let me know if there are questions and take care.   Preventive Care 81-41 Years Old, Male Preventive care refers to lifestyle choices and visits with your health care provider that can promote health and wellness. Preventive care visits are also called wellness exams. What can I expect for my preventive care visit? Counseling During your preventive care visit, your health care provider may ask about your: Medical history, including: Past medical problems. Family medical history. Current health, including: Emotional well-being. Home life and relationship well-being. Sexual activity. Lifestyle, including: Alcohol, nicotine or tobacco, and drug use. Access to firearms. Diet, exercise, and sleep habits. Safety issues such as seatbelt and bike helmet use. Sunscreen use. Work and work Astronomer. Physical exam Your health care provider will check your: Height and weight. These may be used to calculate your BMI (body mass index). BMI is a measurement that tells if you are at a healthy weight. Waist circumference. This measures the  distance around your waistline. This measurement also tells if you are at a healthy weight and may help predict your risk of certain diseases, such as  type 2 diabetes and high blood pressure. Heart rate and blood pressure. Body temperature. Skin for abnormal spots. What immunizations do I need?  Vaccines are usually given at various ages, according to a schedule. Your health care provider will recommend vaccines for you based on your age, medical history, and lifestyle or other factors, such as travel or where you work. What tests do I need? Screening Your health care provider may recommend screening tests for certain conditions. This may include: Lipid and cholesterol levels. Diabetes screening. This is done by checking your blood sugar (glucose) after you have not eaten for a while (fasting). Hepatitis B test. Hepatitis C test. HIV (human immunodeficiency virus) test. STI (sexually transmitted infection) testing, if you are at risk. Lung cancer screening. Prostate cancer screening. Colorectal cancer screening. Talk with your health care provider about your test results, treatment options, and if necessary, the need for more tests. Follow these instructions at home: Eating and drinking  Eat a diet that includes fresh fruits and vegetables, whole grains, lean protein, and low-fat dairy products. Take vitamin and mineral supplements as recommended by your health care provider. Do not drink alcohol if your health care provider tells you not to drink. If you drink alcohol: Limit how much you have to 0-2 drinks a day. Know how much alcohol is in your drink. In the [place] one drink equals one 12 oz bottle of beer (355 mL), one 5 oz glass of wine (148 mL), or one 1 oz glass of hard liquor (44 mL). Lifestyle Brush your teeth every morning and night with fluoride toothpaste. Floss one time each day. Exercise for at least 30 minutes 5 or more days each week. Do not use any products that contain nicotine or tobacco. These products include cigarettes, chewing tobacco, and vaping devices, such as e-cigarettes. If you need help quitting, ask  your health care provider. Do not use drugs. If you are sexually active, practice safe sex. Use a condom or other form of protection to prevent STIs. Take aspirin only as told by your health care provider. Make sure that you understand how much to take and what form to take. Work with your health care provider to find out whether it is safe and beneficial for you to take aspirin daily. Find healthy ways to manage stress, such as: Meditation, yoga, or listening to music. Journaling. Talking to a trusted person. Spending time with friends and family. Minimize exposure to UV radiation to reduce your risk of skin cancer. Safety Always wear your seat belt while driving or riding in a vehicle. Do not drive: If you have been drinking alcohol. Do not ride with someone who has been drinking. When you are tired or distracted. While texting. If you have been using any mind-altering substances or drugs. Wear a helmet and other protective equipment during sports activities. If you have firearms in your house, make sure you follow all gun safety procedures. What's next? Go to your health care provider once a year for an annual wellness visit. Ask your health care provider how often you should have your eyes and teeth checked. Stay up to date on all vaccines. This information is not intended to replace advice given to you by your health care provider. Make sure you discuss any questions you have with your health care provider. Document Revised: 11/14/2020  Document Reviewed: 11/14/2020 Elsevier Patient Education  2024 Elsevier Inc.    Signed,   Meredith Staggers, MD [facility], [facility] 09/03/23 5:19 PM

## 2023-09-04 LAB — COMPREHENSIVE METABOLIC PANEL WITH GFR
ALT: 36 U/L (ref 0–53)
AST: 29 U/L (ref 0–37)
Albumin: 5.1 g/dL (ref 3.5–5.2)
Alkaline Phosphatase: 31 U/L — ABNORMAL LOW (ref 39–117)
BUN: 13 mg/dL (ref 6–23)
CO2: 29 meq/L (ref 19–32)
Calcium: 10.1 mg/dL (ref 8.4–10.5)
Chloride: 98 meq/L (ref 96–112)
Creatinine, Ser: 1.19 mg/dL (ref 0.40–1.50)
GFR: 66.41 mL/min (ref >60.00)
Glucose, Bld: 88 mg/dL (ref 70–99)
Potassium: 4.5 meq/L (ref 3.5–5.1)
Sodium: 139 meq/L (ref 135–145)
Total Bilirubin: 0.9 mg/dL (ref 0.2–1.2)
Total Protein: 8 g/dL (ref 6.0–8.3)

## 2023-09-04 LAB — LIPID PANEL
Cholesterol: 163 mg/dL (ref 0–200)
HDL: 59.8 mg/dL (ref >39.00)
LDL Cholesterol: 75 mg/dL (ref 0–99)
NonHDL: 103.2
Total CHOL/HDL Ratio: 3
Triglycerides: 142 mg/dL (ref 0.0–149.0)
VLDL: 28.4 mg/dL (ref 0.0–40.0)

## 2023-09-04 LAB — CBC WITH DIFFERENTIAL/PLATELET
Basophils Absolute: 0.1 10*3/uL (ref 0.0–0.1)
Basophils Relative: 1.5 % (ref 0.0–3.0)
Eosinophils Absolute: 0 10*3/uL (ref 0.0–0.7)
Eosinophils Relative: 0.4 % (ref 0.0–5.0)
HCT: 46.8 % (ref 39.0–52.0)
Hemoglobin: 15.4 g/dL (ref 13.0–17.0)
Lymphocytes Relative: 51.2 % — ABNORMAL HIGH (ref 12.0–46.0)
Lymphs Abs: 2.3 10*3/uL (ref 0.7–4.0)
MCHC: 33 g/dL (ref 30.0–36.0)
MCV: 90.7 fl (ref 78.0–100.0)
Monocytes Absolute: 0.3 10*3/uL (ref 0.1–1.0)
Monocytes Relative: 6.8 % (ref 3.0–12.0)
Neutro Abs: 1.8 10*3/uL (ref 1.4–7.7)
Neutrophils Relative %: 40.1 % — ABNORMAL LOW (ref 43.0–77.0)
Platelets: 199 10*3/uL (ref 150.0–400.0)
RBC: 5.15 Mil/uL (ref 4.22–5.81)
RDW: 14.9 % (ref 11.5–15.5)
WBC: 4.5 10*3/uL (ref 4.0–10.5)

## 2023-09-04 LAB — HEMOGLOBIN A1C: Hgb A1c MFr Bld: 5.8 % (ref 4.6–6.5)

## 2023-09-04 LAB — TSH: TSH: 2.01 u[IU]/mL (ref 0.35–5.50)

## 2023-09-07 ENCOUNTER — Other Ambulatory Visit: Payer: Self-pay | Admitting: Family Medicine

## 2023-09-07 DIAGNOSIS — I10 Essential (primary) hypertension: Secondary | ICD-10-CM

## 2023-09-07 DIAGNOSIS — K2100 Gastro-esophageal reflux disease with esophagitis, without bleeding: Secondary | ICD-10-CM

## 2023-09-08 ENCOUNTER — Encounter: Payer: Self-pay | Admitting: Family Medicine

## 2023-09-08 DIAGNOSIS — D72820 Lymphocytosis (symptomatic): Secondary | ICD-10-CM

## 2023-09-09 NOTE — Telephone Encounter (Signed)
 Called patient and scheduled lab only visit for 10/06/2023  Future lab work ordered

## 2023-09-09 NOTE — Telephone Encounter (Signed)
-----   Message from Shade Flood sent at 09/08/2023  4:17 PM EDT ----- Results sent by MyChart, please schedule lab visit in 4 to 6 weeks with repeat CBC for lymphocytosis.

## 2023-09-14 ENCOUNTER — Other Ambulatory Visit: Payer: Self-pay | Admitting: Family Medicine

## 2023-09-14 DIAGNOSIS — K2100 Gastro-esophageal reflux disease with esophagitis, without bleeding: Secondary | ICD-10-CM

## 2023-09-14 NOTE — Telephone Encounter (Signed)
 Copied from CRM [id number]. Topic: Clinical - Medication Refill >> Sep 14, 2023  7:37 AM Ovid Blow wrote: Most Recent Primary Care Visit:  Provider: Caro Christmas R  Department: [facility]  Visit Type: PHYSICAL  Date: 09/03/2023  Medication: omeprazole (PRILOSEC) 40 MG capsule  Has the patient contacted their pharmacy? Yes (Agent: If no, request that the patient contact the pharmacy for the refill. If patient does not wish to contact the pharmacy document the reason why and proceed with request.) (Agent: If yes, when and what did the pharmacy advise?)  Is this the correct pharmacy for this prescription? Yes If no, delete pharmacy and type the correct one.  This is the patient's preferred pharmacy:  [id number] - [place], [place] - [address] AT [facility] [address] [facility] [address] [facility] [address] [place] [address] Phone: [phone] Fax: [phone]   Has the prescription been filled recently? No  Is the patient out of the medication? Yes  Has the patient been seen for an appointment in the last year OR does the patient have an upcoming appointment? Yes  Can we respond through MyChart? Yes  Agent: Please be advised that Rx refills may take up to 3 business days. We ask that you follow-up with your pharmacy.

## 2023-10-06 ENCOUNTER — Other Ambulatory Visit (INDEPENDENT_AMBULATORY_CARE_PROVIDER_SITE_OTHER)

## 2023-10-06 DIAGNOSIS — D72820 Lymphocytosis (symptomatic): Secondary | ICD-10-CM | POA: Diagnosis not present

## 2023-10-06 LAB — CBC WITH DIFFERENTIAL/PLATELET
Basophils Absolute: 0 10*3/uL (ref 0.0–0.1)
Basophils Relative: 0.6 % (ref 0.0–3.0)
Eosinophils Absolute: 0 10*3/uL (ref 0.0–0.7)
Eosinophils Relative: 0.7 % (ref 0.0–5.0)
HCT: 45.8 % (ref 39.0–52.0)
Hemoglobin: 15.1 g/dL (ref 13.0–17.0)
Lymphocytes Relative: 53.5 % — ABNORMAL HIGH (ref 12.0–46.0)
Lymphs Abs: 1.8 10*3/uL (ref 0.7–4.0)
MCHC: 32.9 g/dL (ref 30.0–36.0)
MCV: 89.9 fl (ref 78.0–100.0)
Monocytes Absolute: 0.2 10*3/uL (ref 0.1–1.0)
Monocytes Relative: 7.4 % (ref 3.0–12.0)
Neutro Abs: 1.2 10*3/uL — ABNORMAL LOW (ref 1.4–7.7)
Neutrophils Relative %: 37.8 % — ABNORMAL LOW (ref 43.0–77.0)
Platelets: 174 10*3/uL (ref 150.0–400.0)
RBC: 5.1 Mil/uL (ref 4.22–5.81)
RDW: 13.9 % (ref 11.5–15.5)
WBC: 3.3 10*3/uL — ABNORMAL LOW (ref 4.0–10.5)

## 2023-10-12 ENCOUNTER — Encounter: Payer: Self-pay | Admitting: Family Medicine

## 2023-10-12 ENCOUNTER — Telehealth: Payer: Self-pay | Admitting: Family Medicine

## 2023-10-12 DIAGNOSIS — D72820 Lymphocytosis (symptomatic): Secondary | ICD-10-CM

## 2023-10-12 NOTE — Telephone Encounter (Signed)
 Patient is agreeable to referral for Hematology

## 2023-10-12 NOTE — Telephone Encounter (Signed)
 Pt has been informed.

## 2023-10-12 NOTE — Telephone Encounter (Signed)
 Copied from CRM [id number]. Topic: Referral - Question >> Oct 12, 2023 10:29 AM Magdalene School wrote: Reason for CRM: Patient is calling to let Dr. Ester Helms know that he is okay with hematology referral being sent for him.

## 2023-10-12 NOTE — Telephone Encounter (Signed)
 Thanks, order was placed.  Please advise patient.  Let me know if there are questions

## 2023-10-22 ENCOUNTER — Telehealth: Payer: Self-pay

## 2023-10-22 NOTE — Telephone Encounter (Unsigned)
 Copied from CRM [id number]. Topic: Clinical - Medical Advice >> Oct 22, 2023  4:18 PM Danae Duncans wrote: Reason for CRM: Pt has medical questions and prefer PCP to contact him [phone]

## 2023-10-23 NOTE — Telephone Encounter (Signed)
 Are you willing to call patient about question or should I set up virtual appointment to discuss to be sure?

## 2023-10-23 NOTE — Telephone Encounter (Signed)
 Can you check in on this referral for us ?

## 2023-10-23 NOTE — Telephone Encounter (Signed)
 Called patient. He was referred to a hematologist. He has not received a call from their office.  Looking at the referral it looks like there was response on the initial date of referral for a MyChart notification letter but patient has not received anything and I do not see that specific letter.  Can we please check with referrals and see what else is needed to get him scheduled with hematology?  Let me know if I need to order something differently.  Thanks

## 2023-10-28 NOTE — Telephone Encounter (Signed)
Noted.  Thank you for following up on this. 

## 2023-10-28 NOTE — Telephone Encounter (Signed)
 [facility] called him 10/27/23 and got him scheduled for 11/09/2023

## 2023-11-09 ENCOUNTER — Encounter: Payer: Self-pay | Admitting: Hematology and Oncology

## 2023-11-09 ENCOUNTER — Inpatient Hospital Stay: Attending: Hematology and Oncology | Admitting: Hematology and Oncology

## 2023-11-09 ENCOUNTER — Inpatient Hospital Stay

## 2023-11-09 VITALS — BP 128/90 | HR 76 | Temp 97.6°F | Resp 16 | Ht 64.25 in | Wt 162.2 lb

## 2023-11-09 DIAGNOSIS — Z79899 Other long term (current) drug therapy: Secondary | ICD-10-CM | POA: Insufficient documentation

## 2023-11-09 DIAGNOSIS — G629 Polyneuropathy, unspecified: Secondary | ICD-10-CM | POA: Insufficient documentation

## 2023-11-09 DIAGNOSIS — D72819 Decreased white blood cell count, unspecified: Secondary | ICD-10-CM | POA: Insufficient documentation

## 2023-11-09 DIAGNOSIS — R7303 Prediabetes: Secondary | ICD-10-CM | POA: Diagnosis not present

## 2023-11-09 HISTORY — DX: Decreased white blood cell count, unspecified: D72.819

## 2023-11-09 LAB — CMP (CANCER CENTER ONLY)
ALT: 27 U/L (ref 0–44)
AST: 23 U/L (ref 15–41)
Albumin: 4.9 g/dL (ref 3.5–5.0)
Alkaline Phosphatase: 30 U/L — ABNORMAL LOW (ref 38–126)
Anion gap: 8 (ref 5–15)
BUN: 13 mg/dL (ref 6–20)
CO2: 27 mmol/L (ref 22–32)
Calcium: 9.5 mg/dL (ref 8.9–10.3)
Chloride: 103 mmol/L (ref 98–111)
Creatinine: 0.97 mg/dL (ref 0.61–1.24)
GFR, Estimated: >60 mL/min (ref >60)
Glucose, Bld: 95 mg/dL (ref 70–99)
Potassium: 3.4 mmol/L — ABNORMAL LOW (ref 3.5–5.1)
Sodium: 138 mmol/L (ref 135–145)
Total Bilirubin: 1.3 mg/dL — ABNORMAL HIGH (ref 0.0–1.2)
Total Protein: 8.1 g/dL (ref 6.5–8.1)

## 2023-11-09 LAB — CBC WITH DIFFERENTIAL (CANCER CENTER ONLY)
Abs Immature Granulocytes: 0.01 10*3/uL (ref 0.00–0.07)
Basophils Absolute: 0 10*3/uL (ref 0.0–0.1)
Basophils Relative: 1 %
Eosinophils Absolute: 0 10*3/uL (ref 0.0–0.5)
Eosinophils Relative: 0 %
HCT: 46 % (ref 39.0–52.0)
Hemoglobin: 15.9 g/dL (ref 13.0–17.0)
Immature Granulocytes: 0 %
Lymphocytes Relative: 45 %
Lymphs Abs: 1.8 10*3/uL (ref 0.7–4.0)
MCH: 29.2 pg (ref 26.0–34.0)
MCHC: 34.6 g/dL (ref 30.0–36.0)
MCV: 84.6 fL (ref 80.0–100.0)
Monocytes Absolute: 0.3 10*3/uL (ref 0.1–1.0)
Monocytes Relative: 8 %
Neutro Abs: 1.8 10*3/uL (ref 1.7–7.7)
Neutrophils Relative %: 46 %
Platelet Count: 179 10*3/uL (ref 150–400)
RBC: 5.44 MIL/uL (ref 4.22–5.81)
RDW: 12.8 % (ref 11.5–15.5)
WBC Count: 4 10*3/uL (ref 4.0–10.5)
nRBC: 0 % (ref 0.0–0.2)

## 2023-11-09 LAB — VITAMIN B12: Vitamin B-12: 302 pg/mL (ref 180–914)

## 2023-11-09 LAB — FERRITIN: Ferritin: 610 ng/mL — ABNORMAL HIGH (ref 24–336)

## 2023-11-09 NOTE — Assessment & Plan Note (Signed)
 I reviewed his diet history and recommend low-carb intake

## 2023-11-09 NOTE — Assessment & Plan Note (Signed)
 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

## 2023-11-09 NOTE — Progress Notes (Signed)
 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 his African-American heritage I will order additional workup I will call him with test results The slight associated lymphocytosis is a common pattern that is seen associated with constitutional leukopenia  Neuropathy He has slight neuropathy in his hands and feet, most consistent with pattern associated with chronic hyperglycemia I will order serum vitamin B12 to rule out vitamin B12 deficiency as well as a myeloma panel to assess  Prediabetes I reviewed his diet history and recommend low-carb intake  Orders Placed This Encounter  Procedures   CBC with Differential (Cancer Center Only)    Standing Status:   Future    Number of Occurrences:   1    Expiration Date:   11/08/2024   CMP (Cancer Center only)    Standing Status:   Future    Number of Occurrences:   1    Expiration Date:   11/08/2024   Vitamin B12    Standing Status:   Future    Number of Occurrences:   1    Expiration Date:   11/08/2024   Ferritin    Standing Status:   Future    Number of Occurrences:   1    Expiration Date:   11/08/2024   Kappa/lambda light chains    Standing Status:   Future    Number of Occurrences:   1    Expiration Date:   11/08/2024   Multiple Myeloma Panel (SPEP&IFE w/QIG)    Standing Status:   Future    Number of Occurrences:   1    Expiration Date:   11/08/2024    All questions were answered. The patient knows to call the clinic with any problems, questions or concerns. I spent 55 minutes counseling the patient face to face, counseling and review of plan of care.   Almeda Jacobs, MD 11/09/2023 12:56 PM   CHIEF COMPLAINTS/PURPOSE OF CONSULTATION:  Leukopenia with slight associated lymphocytosis  HISTORY OF PRESENTING ILLNESS:  Justin Farley 63 y.o. male is here  because of abnormal CBC  I have the opportunity to review his CBC dated back to 2014 Over the past few years, his total white blood cell count ranges from 3.2-6.2 Most recently, differential reveals slight lymphocytosis He denies recent infection. The last prescription antibiotics was more than 3 months ago There is not reported symptoms of sinus congestion, cough, urinary frequency/urgency or dysuria, diarrhea, joint swelling/pain or abnormal skin rash.  He had no prior history or diagnosis of cancer. His age appropriate screening programs are up-to-date. The patient has no prior diagnosis of autoimmune disease and was not prescribed corticosteroids related products. He does not drink alcohol He noted slight peripheral neuropathy affecting his hands and feet.  The patient is known to have prediabetes  MEDICAL HISTORY:  Past Medical History:  Diagnosis Date   Allergy    Chronic leukopenia 11/09/2023   Colon polyps    Esophagitis    Gastritis    GERD (gastroesophageal reflux disease)    Hypertension    Internal hemorrhoids with grade 2 prolapse 11/28/2014    SURGICAL HISTORY: Past Surgical History:  Procedure Laterality Date   COLONOSCOPY     ESOPHAGOGASTRODUODENOSCOPY     HEMORRHOID BANDING     WISDOM TOOTH EXTRACTION  2009    SOCIAL HISTORY: Social  History   Socioeconomic History   Marital status: Married    Spouse name: Not on file   Number of children: 4   Years of education: Not on file   Highest education level: Not on file  Occupational History   Occupation: Estate agent   Occupation: supervisor    Comment: [employer]  Tobacco Use   Smoking status: Never   Smokeless tobacco: Never  Vaping Use   Vaping status: Never Used  Substance and Sexual Activity   Alcohol use: Yes    Comment: special occasions   Drug use: No   Sexual activity: Yes    Birth control/protection: Condom  Other Topics Concern   Not on file  Social History Narrative   Single.  Education: [employer]. Exercise: 3 days a week for 2 hours.   Social Drivers of Corporate investment banker Strain: Not on file  Food Insecurity: No Food Insecurity (11/09/2023)   Hunger Vital Sign    Worried About Running Out of Food in the Last Year: Never true    Ran Out of Food in the Last Year: Never true  Transportation Needs: No Transportation Needs (11/09/2023)   PRAPARE - Administrator, Civil Service (Medical): No    Lack of Transportation (Non-Medical): No  Physical Activity: Not on file  Stress: Not on file  Social Connections: Unknown (10/11/2021)   Received from [facility], [facility]   Social Network    Social Network: Not on file  Intimate Partner Violence: Not At Risk (11/09/2023)   Humiliation, Afraid, Rape, and Kick questionnaire    Fear of Current or Ex-Partner: No    Emotionally Abused: No    Physically Abused: No    Sexually Abused: No    FAMILY HISTORY: Family History  Problem Relation Age of Onset   Hypertension Mother    Hypertension Sister    Diabetes Brother    Prostate cancer Maternal Uncle    Colon cancer Neg Hx    Esophageal cancer Neg Hx    Rectal cancer Neg Hx    Stomach cancer Neg Hx     ALLERGIES:  is allergic to lamisil  [terbinafine ] and shellfish allergy.  MEDICATIONS:  Current Outpatient Medications  Medication Sig Dispense Refill   albuterol  (VENTOLIN  HFA) 108 (90 Base) MCG/ACT inhaler Inhale 2 puffs into the lungs every 4 (four) hours as needed for wheezing. 18 g 1   betamethasone  dipropionate 0.05 % cream Apply topically daily. 30 g 0   hydrochlorothiazide  (MICROZIDE ) 12.5 MG capsule TAKE 1 CAPSULE BY MOUTH EVERY DAY IN THE MORNING 30 capsule 11   omeprazole  (PRILOSEC) 40 MG capsule TAKE 1 CAPSULE (40 MG TOTAL) BY MOUTH DAILY.AS NEEDED 30 capsule 11   sildenafil  (VIAGRA ) 100 MG tablet TAKE 0.5-1 TABLETS BY MOUTH DAILY AS NEEDED FOR ERECTILE DYSFUNCTION. 5 tablet 11   No current facility-administered medications for  this visit.    REVIEW OF SYSTEMS:   Constitutional: Denies fevers, chills or abnormal night sweats Eyes: Denies blurriness of vision, double vision or watery eyes Ears, nose, mouth, throat, and face: Denies mucositis or sore throat Respiratory: Denies cough, dyspnea or wheezes Cardiovascular: Denies palpitation, chest discomfort or lower extremity swelling Gastrointestinal:  Denies nausea, heartburn or change in bowel habits Skin: Denies abnormal skin rashes Lymphatics: Denies new lymphadenopathy or easy bruising Behavioral/Psych: Mood is stable, no new changes  All other systems were reviewed with the patient and are negative.  PHYSICAL EXAMINATION: ECOG PERFORMANCE STATUS: 0 -  Asymptomatic  Vitals:   11/09/23 1157  BP: (!) 128/90  Pulse: 76  Resp: 16  Temp: 97.6 F (36.4 C)  SpO2: 97%   Filed Weights   11/09/23 1157  Weight: 162 lb 4 oz (73.6 kg)    GENERAL:alert, no distress and comfortable SKIN: skin color, texture, turgor are normal, no rashes or significant lesions EYES: normal, conjunctiva are pink and non-injected, sclera clear OROPHARYNX:no exudate, no erythema and lips, buccal mucosa, and tongue normal  NECK: supple, thyroid  normal size, non-tender, without nodularity LYMPH:  no palpable lymphadenopathy in the cervical, axillary or inguinal LUNGS: clear to auscultation and percussion with normal breathing effort HEART: regular rate & rhythm and no murmurs and no lower extremity edema ABDOMEN:abdomen soft, non-tender and normal bowel sounds Musculoskeletal:no cyanosis of digits and no clubbing  PSYCH: alert & oriented x 3 with fluent speech NEURO: no focal motor/sensory deficits  LABORATORY DATA:  I have reviewed the data as listed Recent Results (from the past 2160 hours)  CBC with Differential/Platelet     Status: Abnormal   Collection Time: 09/03/23  3:56 PM  Result Value Ref Range   WBC 4.5 4.0 - 10.5 K/uL   RBC 5.15 4.22 - 5.81 Mil/uL   Hemoglobin  15.4 13.0 - 17.0 g/dL   HCT 16.1 09.6 - 04.5 %   MCV 90.7 78.0 - 100.0 fl   MCHC 33.0 30.0 - 36.0 g/dL   RDW 40.9 81.1 - 91.4 %   Platelets 199.0 150.0 - 400.0 K/uL   Neutrophils Relative % 40.1 (L) 43.0 - 77.0 %   Lymphocytes Relative 51.2 (H) 12.0 - 46.0 %   Monocytes Relative 6.8 3.0 - 12.0 %   Eosinophils Relative 0.4 0.0 - 5.0 %   Basophils Relative 1.5 0.0 - 3.0 %   Neutro Abs 1.8 1.4 - 7.7 K/uL   Lymphs Abs 2.3 0.7 - 4.0 K/uL   Monocytes Absolute 0.3 0.1 - 1.0 K/uL   Eosinophils Absolute 0.0 0.0 - 0.7 K/uL   Basophils Absolute 0.1 0.0 - 0.1 K/uL  Comprehensive metabolic panel with GFR     Status: Abnormal   Collection Time: 09/03/23  3:56 PM  Result Value Ref Range   Sodium 139 135 - 145 mEq/L   Potassium 4.5 3.5 - 5.1 mEq/L   Chloride 98 96 - 112 mEq/L   CO2 29 19 - 32 mEq/L   Glucose, Bld 88 70 - 99 mg/dL   BUN 13 6 - 23 mg/dL   Creatinine, Ser 7.82 0.40 - 1.50 mg/dL   Total Bilirubin 0.9 0.2 - 1.2 mg/dL   Alkaline Phosphatase 31 (L) 39 - 117 U/L   AST 29 0 - 37 U/L   ALT 36 0 - 53 U/L   Total Protein 8.0 6.0 - 8.3 g/dL   Albumin 5.1 3.5 - 5.2 g/dL   GFR 95.62 >13.08 mL/min    Comment: Calculated using the CKD-EPI Creatinine Equation (2021)   Calcium 10.1 8.4 - 10.5 mg/dL  Lipid panel     Status: None   Collection Time: 09/03/23  3:56 PM  Result Value Ref Range   Cholesterol 163 0 - 200 mg/dL    Comment: ATP III Classification       Desirable:  < 200 mg/dL               Borderline High:  200 - 239 mg/dL          High:  > = 657 mg/dL   Triglycerides  142.0 0.0 - 149.0 mg/dL    Comment: Normal:  <045 mg/dLBorderline High:  150 - 199 mg/dL   HDL 40.98 >11.91 mg/dL   VLDL 47.8 0.0 - 29.5 mg/dL   LDL Cholesterol 75 0 - 99 mg/dL   Total CHOL/HDL Ratio 3     Comment:                Men          Women1/2 Average Risk     3.4          3.3Average Risk          5.0          4.42X Average Risk          9.6          7.13X Average Risk          15.0          11.0                        NonHDL 103.20     Comment: NOTE:  Non-HDL goal should be 30 mg/dL higher than patient's LDL goal (i.e. LDL goal of < 70 mg/dL, would have non-HDL goal of < 100 mg/dL)  TSH     Status: None   Collection Time: 09/03/23  3:56 PM  Result Value Ref Range   TSH 2.01 0.35 - 5.50 uIU/mL  HgB A1c     Status: None   Collection Time: 09/03/23  3:56 PM  Result Value Ref Range   Hgb A1c MFr Bld 5.8 4.6 - 6.5 %    Comment: Glycemic Control Guidelines for People with Diabetes:Non Diabetic:  <6%Goal of Therapy: <7%Additional Action Suggested:  >8%   CBC with Differential/Platelet     Status: Abnormal   Collection Time: 10/06/23  8:16 AM  Result Value Ref Range   WBC 3.3 (L) 4.0 - 10.5 K/uL   RBC 5.10 4.22 - 5.81 Mil/uL   Hemoglobin 15.1 13.0 - 17.0 g/dL   HCT 62.1 30.8 - 65.7 %   MCV 89.9 78.0 - 100.0 fl   MCHC 32.9 30.0 - 36.0 g/dL   RDW 84.6 96.2 - 95.2 %   Platelets 174.0 150.0 - 400.0 K/uL   Neutrophils Relative % 37.8 (L) 43.0 - 77.0 %   Lymphocytes Relative 53.5 (H) 12.0 - 46.0 %   Monocytes Relative 7.4 3.0 - 12.0 %   Eosinophils Relative 0.7 0.0 - 5.0 %   Basophils Relative 0.6 0.0 - 3.0 %   Neutro Abs 1.2 (L) 1.4 - 7.7 K/uL   Lymphs Abs 1.8 0.7 - 4.0 K/uL   Monocytes Absolute 0.2 0.1 - 1.0 K/uL   Eosinophils Absolute 0.0 0.0 - 0.7 K/uL   Basophils Absolute 0.0 0.0 - 0.1 K/uL

## 2023-11-09 NOTE — Assessment & Plan Note (Signed)
 The most likely cause of his chronic intermittent leukopenia is his African-American heritage I will order additional workup I will call him with test results The slight associated lymphocytosis is a common pattern that is seen associated with constitutional leukopenia

## 2023-11-10 LAB — KAPPA/LAMBDA LIGHT CHAINS
Kappa free light chain: 20.3 mg/L — ABNORMAL HIGH (ref 3.3–19.4)
Kappa, lambda light chain ratio: 2.07 — ABNORMAL HIGH (ref 0.26–1.65)
Lambda free light chains: 9.8 mg/L (ref 5.7–26.3)

## 2023-11-12 ENCOUNTER — Other Ambulatory Visit: Payer: Self-pay | Admitting: Hematology and Oncology

## 2023-11-12 ENCOUNTER — Ambulatory Visit: Payer: Self-pay | Admitting: Hematology and Oncology

## 2023-11-12 DIAGNOSIS — R768 Other specified abnormal immunological findings in serum: Secondary | ICD-10-CM | POA: Insufficient documentation

## 2023-11-12 DIAGNOSIS — D72819 Decreased white blood cell count, unspecified: Secondary | ICD-10-CM

## 2023-11-12 DIAGNOSIS — R7989 Other specified abnormal findings of blood chemistry: Secondary | ICD-10-CM | POA: Insufficient documentation

## 2023-11-12 LAB — MULTIPLE MYELOMA PANEL, SERUM
Albumin SerPl Elph-Mcnc: 4.3 g/dL (ref 2.9–4.4)
Albumin/Glob SerPl: 1.2 (ref 0.7–1.7)
Alpha 1: 0.2 g/dL (ref 0.0–0.4)
Alpha2 Glob SerPl Elph-Mcnc: 0.5 g/dL (ref 0.4–1.0)
B-Globulin SerPl Elph-Mcnc: 1.3 g/dL (ref 0.7–1.3)
Gamma Glob SerPl Elph-Mcnc: 1.6 g/dL (ref 0.4–1.8)
Globulin, Total: 3.6 g/dL (ref 2.2–3.9)
IgA: 234 mg/dL (ref 90–386)
IgG (Immunoglobin G), Serum: 1616 mg/dL — ABNORMAL HIGH (ref 603–1613)
IgM (Immunoglobulin M), Srm: 157 mg/dL (ref 20–172)
Total Protein ELP: 7.9 g/dL (ref 6.0–8.5)

## 2023-11-12 NOTE — Telephone Encounter (Signed)
-----   Message from [employer] sent at 11/12/2023  8:16 AM EDT ----- Pls call him, his repeat CBC is normal, WBC is normal. B12 level is borderline low I recommend OTC vitamin B12 1000 mcg daily. Other labs I ordered suggested some inflammation, could be related to  stress or other causes. I can see him back in 6 months with repeat labs or he can follow with PCP, let me know what he decides ----- Message ----- From: Interface, Lab In [employer] Sent: 11/09/2023   1:10 PM EDT To: Almeda Jacobs, MD

## 2023-11-12 NOTE — Telephone Encounter (Signed)
 Called and given below message. He verbalized understanding and would like to follow up with Dr. Marton Sleeper in 6 months.

## 2023-12-02 ENCOUNTER — Ambulatory Visit: Admitting: Family Medicine

## 2023-12-02 ENCOUNTER — Encounter: Payer: Self-pay | Admitting: Family Medicine

## 2023-12-02 VITALS — BP 124/70 | HR 71 | Temp 98.7°F | Resp 16 | Ht 64.25 in | Wt 165.8 lb

## 2023-12-02 DIAGNOSIS — R490 Dysphonia: Secondary | ICD-10-CM

## 2023-12-02 DIAGNOSIS — E538 Deficiency of other specified B group vitamins: Secondary | ICD-10-CM | POA: Diagnosis not present

## 2023-12-02 DIAGNOSIS — R82998 Other abnormal findings in urine: Secondary | ICD-10-CM | POA: Diagnosis not present

## 2023-12-02 DIAGNOSIS — E876 Hypokalemia: Secondary | ICD-10-CM

## 2023-12-02 DIAGNOSIS — J029 Acute pharyngitis, unspecified: Secondary | ICD-10-CM

## 2023-12-02 DIAGNOSIS — D72819 Decreased white blood cell count, unspecified: Secondary | ICD-10-CM | POA: Diagnosis not present

## 2023-12-02 DIAGNOSIS — K219 Gastro-esophageal reflux disease without esophagitis: Secondary | ICD-10-CM

## 2023-12-02 LAB — POCT URINALYSIS DIPSTICK
Bilirubin, UA: NEGATIVE
Blood, UA: NEGATIVE
Glucose, UA: NEGATIVE
Ketones, UA: NEGATIVE
Leukocytes, UA: NEGATIVE
Nitrite, UA: NEGATIVE
Protein, UA: NEGATIVE
Spec Grav, UA: <=1.005 — AB (ref 1.010–1.025)
Urobilinogen, UA: 0.2 U/dL
pH, UA: 6 (ref 5.0–8.0)

## 2023-12-02 NOTE — Progress Notes (Signed)
 Subjective:  Patient ID: Justin Farley, male    DOB: 1960-10-15  Age: 63 y.o. MRN: [id number]  CC:  Chief Complaint  Patient presents with   Referral    Notes saw hematology and was advised likley dehydration but notes still has occasional dark urine,    Sore Throat    Notes intermittent sore throat and hoarseness over the last month or so     HPI Justin Farley presents for   Chronic leukopenia: Evaluated June 9 by oncology, Dr. Lonn.  Note reviewed, slight neuropathy in hands and feet consistent with chronic hyperglycemia, B12 ordered as well as multiple myeloma panel.  Her note also indicates the most likely cause of his chronic intermittent leukopenia is African-American heritage, additional workup and tests were ordered.  Associated lymphocytosis is common pattern seen with associated constitutional leukopenia. B12 level was borderline low, recommended over-the-counter B12 1000 mcg daily.  Other labs suggested some inflammation, could be related to stress or other causes, 84-month follow-up planned with repeat labs or PCP follow-up.  CMP noted with potassium 3.4, bilirubin 1.3.  Alk phos 30.  Ferritin 610, kappa free light chain 20.3, kappa serum  Lambda light chain ratio 2.07 IgG elevated but barely at 1616 with upper cutoff of 1613.  Some dark urine at times.   In office urinalysis today with spec gravity less than 1.005.  Otherwise negative. No hematuria. No dysuria.   Taking B12 supplement - past few weeks.   Hypokalemia.  Borderline on 6/9 labs. 3.4. no current potassium supplement.    Sore throat/hoarseness: Past 4-6 weeks. Notices after sneezing, rare sneeze. Around powders at [employer]. Hoarse after sneeze, with slight soreness in throat.  Hx of reflux. Some flairs at times. 2-3 times per week - with certain foods. With reflux flare, notes some hoarseness, sore throat.  No PND, no fever, no difficulty swallowing, night sweats or weight loss.  Prilosec in the  morning - with meal or prior. No fever, no congestion.     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   Elevated PSA 08/25/2022   Internal hemorrhoids with grade 2 prolapse 11/28/2014   HTN (hypertension) 04/19/2013   GERD (gastroesophageal reflux disease) 04/19/2013   Past Medical History:  Diagnosis Date   Allergy    Chronic leukopenia 11/09/2023   Colon polyps    Esophagitis    Gastritis    GERD (gastroesophageal reflux disease)    Hypertension    Internal hemorrhoids with grade 2 prolapse 11/28/2014   Past Surgical History:  Procedure Laterality Date   COLONOSCOPY     ESOPHAGOGASTRODUODENOSCOPY     HEMORRHOID BANDING     WISDOM TOOTH EXTRACTION  2009   Allergies  Allergen Reactions   Lamisil  [Terbinafine ]     Sharp stomach pain   Shellfish Allergy Swelling   Prior to Admission medications   Medication Sig Start Date End Date Taking? Authorizing Provider  albuterol  (VENTOLIN  HFA) 108 (90 Base) MCG/ACT inhaler Inhale 2 puffs into the lungs every 4 (four) hours as needed for wheezing. 10/03/19  Yes Levora Reyes SAUNDERS, MD  betamethasone  dipropionate 0.05 % cream Apply topically daily. 07/17/21  Yes Levora Reyes SAUNDERS, MD  cyanocobalamin  (VITAMIN B12) 1000 MCG tablet Take 1,000 mcg by mouth daily.   Yes [provider]  hydrochlorothiazide  (MICROZIDE ) 12.5 MG capsule TAKE 1 CAPSULE BY MOUTH EVERY DAY IN THE MORNING 09/07/23  Yes Levora Reyes SAUNDERS, MD  omeprazole  (PRILOSEC) 40 MG capsule TAKE 1 CAPSULE (40 MG TOTAL) BY MOUTH DAILY.AS NEEDED 09/07/23  Yes Levora Reyes SAUNDERS, MD  sildenafil  (VIAGRA ) 100 MG tablet TAKE 0.5-1 TABLETS BY MOUTH DAILY AS NEEDED FOR ERECTILE DYSFUNCTION. 04/28/23  Yes Levora Reyes SAUNDERS, MD   Social History   Socioeconomic  History   Marital status: Married    Spouse name: Not on file   Number of children: 4   Years of education: Not on file   Highest education level: Not on file  Occupational History   Occupation: Estate agent   Occupation: supervisor    Comment: [employer]  Tobacco Use   Smoking status: Never   Smokeless tobacco: Never  Vaping Use   Vaping status: Never Used  Substance and Sexual Activity   Alcohol use: Yes    Comment: special occasions   Drug use: No   Sexual activity: Yes    Birth control/protection: Condom  Other Topics Concern   Not on file  Social History Narrative   Single. Education: [employer]. Exercise: 3 days a week for 2 hours.   Social Drivers of Corporate investment banker Strain: Not on file  Food Insecurity: No Food Insecurity (11/09/2023)   Hunger Vital Sign    Worried About Running Out of Food in the Last Year: Never true    Ran Out of Food in the Last Year: Never true  Transportation Needs: No Transportation Needs (11/09/2023)   PRAPARE - Administrator, Civil Service (Medical): No    Lack of Transportation (Non-Medical): No  Physical Activity: Not on file  Stress: Not on file  Social Connections: Unknown (10/11/2021)   Received from [facility]   Social Network    Social Network: Not on file  Intimate Partner Violence: Not At Risk (11/09/2023)   Humiliation, Afraid, Rape, and Kick questionnaire    Fear of Current or Ex-Partner: No    Emotionally Abused: No    Physically Abused: No    Sexually Abused: No    Review of Systems Per HPI.   Objective:   Vitals:   12/02/23 1556  BP: 124/70  Pulse: 71  Resp: 16  Temp: 98.7 F (37.1 C)  TempSrc: Temporal  SpO2: 99%  Weight: 165 lb 12.8 oz (75.2 kg)  Height: 5' 4.25 (1.632 m)     Physical Exam Vitals reviewed.  Constitutional:      Appearance: He is well-developed.  HENT:     Head: Normocephalic and atraumatic.     Mouth/Throat:     Mouth: Mucous membranes are moist.      Pharynx: No oropharyngeal exudate or posterior oropharyngeal erythema.  Neck:     Vascular: No carotid bruit or JVD.     Comments: No stridor, no cervical lymphadenopathy. Cardiovascular:     Rate and Rhythm: Normal rate and regular rhythm.     Heart sounds: Normal heart sounds. No murmur heard. Pulmonary:     Effort: Pulmonary effort is normal.     Breath sounds: Normal breath sounds. No rales.  Abdominal:     General: There is no distension.     Tenderness: There is no abdominal tenderness.  Musculoskeletal:     Right lower leg: No edema.     Left lower leg: No edema.  Skin:    General: Skin is  warm and dry.  Neurological:     Mental Status: He is alert and oriented to person, place, and time.  Psychiatric:        Mood and Affect: Mood normal.        Assessment & Plan:  Justin Farley is a 63 y.o. male . Chronic leukopenia  - Reassuring evaluation with hematology as above.  WBC had increased on hematology testing.  Continue to monitor.  Dark urine - Plan: POCT Urinalysis Dipstick  - Suspected volume depletion based on specific gravity, reassuring urinalysis otherwise, increased hydration recommended with RTC precautions.  Otherwise asymptomatic.  B12 deficiency - Plan: B12  - Few weeks on B12 replacement, plan for  labs at [facility] location in the next week or 2 for recheck level.  History of possible neuropathy, would like to see it with B12 replacement if that changes, 1 month follow-up.  Can look at other causes at that time as well, RTC precautions if worse sooner.  Hoarseness Sore throat Gastroesophageal reflux disease, unspecified whether esophagitis present  - Intermittent symptoms and some flare with flare and heartburn in regards to the sore throat and hoarseness, suspected reflux and upper airway irritation.  Continue PPI, add Pepcid over-the-counter once per day, and avoidance of trigger foods and for now with RTC precautions if persistent sore  throat or hoarseness.  Hypokalemia - Plan: Basic metabolic panel with GFR  - Borderline, potassium rich foods recommended with repeat labs in the next few weeks at [facility] lab.  No orders of the defined types were placed in this encounter.  Patient Instructions  Increase water intake - urine was concentrated today, but no sign of infection or blood.  Repeat labs for B12 and potassium in next week to 2 weeks at the most.  Potassium rich food in the diet can help (like banana or sweet potato).   Take the omeprazole  before meal and add pepcid at night (over the counter).  Try to avoid trigger foods for now (see below). If sore throat/hoarseness does not resolve let me know.  Will recheck in 1 month as well.   [facility] or xray: Walk in 8:30-4:30 during weekdays, no appointment needed [address]  [place], [place] [address]   GERD in Adults: Diet Changes When you have gastroesophageal reflux disease (GERD), you may need to make changes to your diet. Choosing the right foods can help with your symptoms. Think about working with an expert in healthy eating called a dietitian. They can help you make healthy food choices. What are tips for following this plan? Reading food labels Look for foods that are low in saturated fat. Foods that may help with your symptoms include: Foods with less than 5% of daily value (DV) of fat. Foods with 0 grams of trans fat. Cooking [employer] in ways that don't use a lot of fat. These ways include: Baking. Steaming. Grilling. Broiling. To add flavor, try to use herbs that are low in spice and acidity. Avoid frying your food. Meal planning  Eat small meals often rather than eating 3 large meals each day. Eat your meals slowly in a place where you feel relaxed. If told by your health care provider, avoid: Foods that cause symptoms. Keep a food diary to keep track of foods that cause symptoms. Alcohol. Drinking a lot of liquid with  meals. General instructions For 2-3 hours after you eat, avoid: Bending over. Exercise. Lying down. Chew sugar-free gum after meals.  What foods should I eat? Eat a healthy diet. Try to include: Foods with high amounts of fiber. These include: Fruits and vegetables. Whole grains and beans. Low-fat dairy products. Lean meats, fish, and poultry. Egg whites. Foods that cause symptoms in someone else may not cause symptoms for you. Work with your provider to find foods that are safe for you. The items listed above may not be all the foods and drinks you can have. Talk with a dietitian to learn more. The items listed above may not be a complete list of foods and beverages you can eat and drink. Contact a dietitian for more information. What foods should I avoid? Limiting some of these foods may help with your symptoms. Each person is different. Talk with a dietitian or your provider to help you find the exact foods to avoid. Some of the foods to avoid may include: Fruits Fruits with a lot of acid in them. These may include citrus fruits, such as oranges, grapefruit, pineapple, and lemons. Vegetables Deep-fried vegetables, such as [place] fries. Vegetables, sauces, or toppings made with added fat and vegetables with acid in them. These may include tomatoes and tomato products, chili peppers, onions, garlic, and horseradish. Grains Pastries or quick breads with added fat. Meats and other proteins High-fat meats, such as fatty beef or pork, hot dogs, ribs, ham, sausage, salami, and bacon. Fried meat or protein, such as fried fish and fried chicken. Egg yolks. Fats and oils Butter. Margarine. Shortening. Ghee. Drinks Coffee and other drinks with caffeine in them. Fizzy and sugary drinks, such as soda and energy drinks. Fruit juice made with acidic fruits, such as orange or grapefruit. Tomato juice. Sweets and desserts Chocolate and cocoa. Donuts. Seasonings and condiments Mint, such as  peppermint and spearmint. Condiments, herbs, or seasonings that cause symptoms. These may include curry, hot sauce, or vinegar-based salad dressings. The items listed above may not be all the foods and drinks you should avoid. Talk with a dietitian to learn more. Questions to ask your health care provider Changes to your diet and everyday life are often the first steps taken to manage symptoms of GERD. If these changes don't help, talk with your provider about taking medicines. Where to find more information International Foundation for Gastrointestinal Disorders: aboutgerd.org This information is not intended to replace advice given to you by your health care provider. Make sure you discuss any questions you have with your health care provider. Document Revised: 03/31/2023 Document Reviewed: 10/15/2022 Elsevier Patient Education  2024 Elsevier Inc.Sore Throat A sore throat is pain, burning, irritation, or scratchiness in the throat. When you have a sore throat, you may feel pain or tenderness in your throat when you swallow or talk. Many things can cause a sore throat, including: An infection. Seasonal allergies. Dryness in the air. Irritants, such as smoke or pollution. Radiation treatment for cancer. Gastroesophageal reflux disease (GERD). A tumor. A sore throat is often the first sign of another sickness. It may happen with other symptoms, such as coughing, sneezing, fever, and swollen neck glands. Most sore throats go away without medical treatment. Follow these instructions at home:     Medicines Take over-the-counter and prescription medicines only as told by your health care provider. Children often get sore throats. Do not give your child aspirin because of the association with Reye's syndrome. Use throat sprays to soothe your throat as told by your health care provider. Managing pain To help with pain, try: Sipping warm liquids, such as broth,  herbal tea, or warm  water. Eating or drinking cold or frozen liquids, such as frozen ice pops. Gargling with a mixture of salt and water 3-4 times a day or as needed. To make salt water, completely dissolve -1 tsp (3-6 g) of salt in 1 cup (237 mL) of warm water. Sucking on hard candy or throat lozenges. Putting a cool-mist humidifier in your bedroom at night to moisten the air. Sitting in the bathroom with the door closed for 5-10 minutes while you run hot water in the shower. General instructions Do not use any products that contain nicotine or tobacco. These products include cigarettes, chewing tobacco, and vaping devices, such as e-cigarettes. If you need help quitting, ask your health care provider. Rest as needed. Drink enough fluid to keep your urine pale yellow. Wash your hands often with soap and water for at least 20 seconds. If soap and water are not available, use hand sanitizer. Contact a health care provider if: You have a fever for more than 2-3 days. You have symptoms that last for more than 2-3 days. Your throat does not get better within 7 days. You have a fever and your symptoms suddenly get worse. Get help right away if: You have difficulty breathing. You cannot swallow fluids, soft foods, or your saliva. You have increased swelling in your throat or neck. You have persistent nausea and vomiting. These symptoms may represent a serious problem that is an emergency. Do not wait to see if the symptoms will go away. Get medical help right away. Call your local emergency services (911 in the [place] Do not drive yourself to the hospital. Summary A sore throat is pain, burning, irritation, or scratchiness in the throat. Many things can cause a sore throat. Take over-the-counter medicines only as told by your health care provider. Rest as needed. Drink enough fluid to keep your urine pale yellow. Contact a health care provider if your throat does not get better within 7 days. This information is  not intended to replace advice given to you by your health care provider. Make sure you discuss any questions you have with your health care provider. Document Revised: 08/15/2020 Document Reviewed: 08/15/2020 Elsevier Patient Education  2024 Elsevier Inc.      Signed,   Reyes Pines, MD  Primary Care, [facility] 12/02/23 4:50 PM

## 2023-12-02 NOTE — Patient Instructions (Addendum)
 Increase water intake - urine was concentrated today, but no sign of infection or blood.  Repeat labs for B12 and potassium in next week to 2 weeks at the most.  Potassium rich food in the diet can help (like banana or sweet potato).   Take the omeprazole  before meal and add pepcid at night (over the counter).  Try to avoid trigger foods for now (see below). If sore throat/hoarseness does not resolve let me know.  Will recheck in 1 month as well.   [facility] or xray: Walk in 8:30-4:30 during weekdays, no appointment needed [address]  [place], [place] [address]   GERD in Adults: Diet Changes When you have gastroesophageal reflux disease (GERD), you may need to make changes to your diet. Choosing the right foods can help with your symptoms. Think about working with an expert in healthy eating called a dietitian. They can help you make healthy food choices. What are tips for following this plan? Reading food labels Look for foods that are low in saturated fat. Foods that may help with your symptoms include: Foods with less than 5% of daily value (DV) of fat. Foods with 0 grams of trans fat. Cooking [employer] in ways that don't use a lot of fat. These ways include: Baking. Steaming. Grilling. Broiling. To add flavor, try to use herbs that are low in spice and acidity. Avoid frying your food. Meal planning  Eat small meals often rather than eating 3 large meals each day. Eat your meals slowly in a place where you feel relaxed. If told by your health care provider, avoid: Foods that cause symptoms. Keep a food diary to keep track of foods that cause symptoms. Alcohol. Drinking a lot of liquid with meals. General instructions For 2-3 hours after you eat, avoid: Bending over. Exercise. Lying down. Chew sugar-free gum after meals. What foods should I eat? Eat a healthy diet. Try to include: Foods with high amounts of fiber. These include: Fruits and  vegetables. Whole grains and beans. Low-fat dairy products. Lean meats, fish, and poultry. Egg whites. Foods that cause symptoms in someone else may not cause symptoms for you. Work with your provider to find foods that are safe for you. The items listed above may not be all the foods and drinks you can have. Talk with a dietitian to learn more. The items listed above may not be a complete list of foods and beverages you can eat and drink. Contact a dietitian for more information. What foods should I avoid? Limiting some of these foods may help with your symptoms. Each person is different. Talk with a dietitian or your provider to help you find the exact foods to avoid. Some of the foods to avoid may include: Fruits Fruits with a lot of acid in them. These may include citrus fruits, such as oranges, grapefruit, pineapple, and lemons. Vegetables Deep-fried vegetables, such as [place] fries. Vegetables, sauces, or toppings made with added fat and vegetables with acid in them. These may include tomatoes and tomato products, chili peppers, onions, garlic, and horseradish. Grains Pastries or quick breads with added fat. Meats and other proteins High-fat meats, such as fatty beef or pork, hot dogs, ribs, ham, sausage, salami, and bacon. Fried meat or protein, such as fried fish and fried chicken. Egg yolks. Fats and oils Butter. Margarine. Shortening. Ghee. Drinks Coffee and other drinks with caffeine in them. Fizzy and sugary drinks, such as soda and energy drinks. Fruit juice  made with acidic fruits, such as orange or grapefruit. Tomato juice. Sweets and desserts Chocolate and cocoa. Donuts. Seasonings and condiments Mint, such as peppermint and spearmint. Condiments, herbs, or seasonings that cause symptoms. These may include curry, hot sauce, or vinegar-based salad dressings. The items listed above may not be all the foods and drinks you should avoid. Talk with a dietitian to learn  more. Questions to ask your health care provider Changes to your diet and everyday life are often the first steps taken to manage symptoms of GERD. If these changes don't help, talk with your provider about taking medicines. Where to find more information International Foundation for Gastrointestinal Disorders: aboutgerd.org This information is not intended to replace advice given to you by your health care provider. Make sure you discuss any questions you have with your health care provider. Document Revised: 03/31/2023 Document Reviewed: 10/15/2022 Elsevier Patient Education  2024 Elsevier Inc.Sore Throat A sore throat is pain, burning, irritation, or scratchiness in the throat. When you have a sore throat, you may feel pain or tenderness in your throat when you swallow or talk. Many things can cause a sore throat, including: An infection. Seasonal allergies. Dryness in the air. Irritants, such as smoke or pollution. Radiation treatment for cancer. Gastroesophageal reflux disease (GERD). A tumor. A sore throat is often the first sign of another sickness. It may happen with other symptoms, such as coughing, sneezing, fever, and swollen neck glands. Most sore throats go away without medical treatment. Follow these instructions at home:     Medicines Take over-the-counter and prescription medicines only as told by your health care provider. Children often get sore throats. Do not give your child aspirin because of the association with Reye's syndrome. Use throat sprays to soothe your throat as told by your health care provider. Managing pain To help with pain, try: Sipping warm liquids, such as broth, herbal tea, or warm water. Eating or drinking cold or frozen liquids, such as frozen ice pops. Gargling with a mixture of salt and water 3-4 times a day or as needed. To make salt water, completely dissolve -1 tsp (3-6 g) of salt in 1 cup (237 mL) of warm water. Sucking on hard candy or  throat lozenges. Putting a cool-mist humidifier in your bedroom at night to moisten the air. Sitting in the bathroom with the door closed for 5-10 minutes while you run hot water in the shower. General instructions Do not use any products that contain nicotine or tobacco. These products include cigarettes, chewing tobacco, and vaping devices, such as e-cigarettes. If you need help quitting, ask your health care provider. Rest as needed. Drink enough fluid to keep your urine pale yellow. Wash your hands often with soap and water for at least 20 seconds. If soap and water are not available, use hand sanitizer. Contact a health care provider if: You have a fever for more than 2-3 days. You have symptoms that last for more than 2-3 days. Your throat does not get better within 7 days. You have a fever and your symptoms suddenly get worse. Get help right away if: You have difficulty breathing. You cannot swallow fluids, soft foods, or your saliva. You have increased swelling in your throat or neck. You have persistent nausea and vomiting. These symptoms may represent a serious problem that is an emergency. Do not wait to see if the symptoms will go away. Get medical help right away. Call your local emergency services (911 in the [place] Do not  drive yourself to the hospital. Summary A sore throat is pain, burning, irritation, or scratchiness in the throat. Many things can cause a sore throat. Take over-the-counter medicines only as told by your health care provider. Rest as needed. Drink enough fluid to keep your urine pale yellow. Contact a health care provider if your throat does not get better within 7 days. This information is not intended to replace advice given to you by your health care provider. Make sure you discuss any questions you have with your health care provider. Document Revised: 08/15/2020 Document Reviewed: 08/15/2020 Elsevier Patient Education  2024 [employer].

## 2023-12-07 ENCOUNTER — Other Ambulatory Visit (INDEPENDENT_AMBULATORY_CARE_PROVIDER_SITE_OTHER)

## 2023-12-07 DIAGNOSIS — E538 Deficiency of other specified B group vitamins: Secondary | ICD-10-CM

## 2023-12-07 DIAGNOSIS — E876 Hypokalemia: Secondary | ICD-10-CM | POA: Diagnosis not present

## 2023-12-07 LAB — VITAMIN B12: Vitamin B-12: 558 pg/mL (ref 211–911)

## 2023-12-07 LAB — BASIC METABOLIC PANEL WITH GFR
BUN: 17 mg/dL (ref 6–23)
CO2: 29 meq/L (ref 19–32)
Calcium: 9.5 mg/dL (ref 8.4–10.5)
Chloride: 101 meq/L (ref 96–112)
Creatinine, Ser: 1.11 mg/dL (ref 0.40–1.50)
GFR: 72.06 mL/min (ref >60.00)
Glucose, Bld: 100 mg/dL — ABNORMAL HIGH (ref 70–99)
Potassium: 3.7 meq/L (ref 3.5–5.1)
Sodium: 138 meq/L (ref 135–145)

## 2023-12-08 ENCOUNTER — Ambulatory Visit: Payer: Self-pay | Admitting: Family Medicine

## 2024-01-11 ENCOUNTER — Encounter: Payer: Self-pay | Admitting: Family Medicine

## 2024-01-11 ENCOUNTER — Ambulatory Visit: Admitting: Family Medicine

## 2024-01-11 VITALS — BP 116/80 | HR 73 | Temp 98.9°F | Ht 64.0 in | Wt 161.8 lb

## 2024-01-11 DIAGNOSIS — K219 Gastro-esophageal reflux disease without esophagitis: Secondary | ICD-10-CM | POA: Diagnosis not present

## 2024-01-11 DIAGNOSIS — N529 Male erectile dysfunction, unspecified: Secondary | ICD-10-CM | POA: Diagnosis not present

## 2024-01-11 DIAGNOSIS — R490 Dysphonia: Secondary | ICD-10-CM

## 2024-01-11 MED ORDER — SILDENAFIL CITRATE 100 MG PO TABS: ORAL_TABLET | ORAL | 11 refills | Status: DC

## 2024-01-11 NOTE — Patient Instructions (Addendum)
 Try taking pepcid nightly, stay on omeprazole  daily, and I will refer you to gastroenterology.  Sildenafil  refilled.  Lowest effective dose.  Keep follow-up with me as planned in October and we can review other chronic medications and labs at that time.  Let me know if there are questions in the meantime.  Take care!

## 2024-01-11 NOTE — Progress Notes (Signed)
 Subjective:  Patient ID: Justin Farley, male    DOB: 09/06/1960  Age: 63 y.o. MRN: [id number]  CC:  Chief Complaint  Patient presents with   Follow-up    Patient wondering about his lab results. Still has acid reflux, takes omeprazole . Some improvements noticed.     HPI HERSEL MCMEEN presents for   Reflux, sore throat, hoarseness Discussed that his July 2 visit.  4 to 6 weeks of sore throat, noticed after sneezing, rare sneeze, hoarse at times.  Reflux flares 2-3 times per week with certain foods.  Did attribute some hoarseness with reflux.  Continued on PPI and added Pepcid once per day. Some improvement, still some breakthrough heartburn 1-2 times per week. Watching diet.  Taking omeprazole  every morning. Pepcid only 1-2 times per week - only if having symptoms.  Sore throat/hoarseness still there but a little better.  Endoscopy 08/12/2013, Dr. Teressa.  Mild reflux related esophagitis, mild distal gastritis  B12 deficiency See prior notes, borderline low B12 previously.  Started on supplement after meeting with hematology.  Had only been on for a few weeks at his last visit, B12 level was normal at 558 on recheck.  Still taking supplement.  Lab Results  Component Value Date   NA 138 12/07/2023   K 3.7 12/07/2023   CL 101 12/07/2023   CO2 29 12/07/2023   Lab Results  Component Value Date   VITAMINB12 558 12/07/2023    Erectile dysfunction Effective with 1/2 pill in past. No vision/hearing changes. No CP/DOE with exercise or intercourse.  Difficulty maintaining erection - meds helped prior - requests RF.    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   Elevated PSA 08/25/2022   Internal hemorrhoids with grade 2 prolapse 11/28/2014   HTN  (hypertension) 04/19/2013   GERD (gastroesophageal reflux disease) 04/19/2013   Past Medical History:  Diagnosis Date   Allergy    Chronic leukopenia 11/09/2023   Colon polyps    Esophagitis    Gastritis    GERD (gastroesophageal reflux disease)    Hypertension    Internal hemorrhoids with grade 2 prolapse 11/28/2014   Past Surgical History:  Procedure Laterality Date   COLONOSCOPY     ESOPHAGOGASTRODUODENOSCOPY     HEMORRHOID BANDING     WISDOM TOOTH EXTRACTION  2009   Allergies  Allergen Reactions   Lamisil  [Terbinafine ]     Sharp stomach pain   Shellfish Allergy Swelling   Prior to Admission medications   Medication Sig Start Date End Date Taking? Authorizing Provider  albuterol  (VENTOLIN  HFA) 108 (90 Base) MCG/ACT inhaler Inhale 2 puffs into the lungs every 4 (four) hours as needed for wheezing. 10/03/19  Yes Levora Reyes SAUNDERS, MD  cyanocobalamin  (VITAMIN B12) 1000 MCG tablet Take 1,000 mcg by mouth daily.   Yes [provider]  hydrochlorothiazide  (MICROZIDE ) 12.5 MG capsule TAKE 1 CAPSULE BY MOUTH EVERY DAY IN THE MORNING 09/07/23  Yes Levora Reyes SAUNDERS, MD  omeprazole  (PRILOSEC) 40 MG capsule TAKE 1 CAPSULE (40 MG TOTAL) BY MOUTH DAILY.AS NEEDED 09/07/23  Yes Levora Reyes SAUNDERS, MD  sildenafil  (VIAGRA ) 100 MG tablet TAKE 0.5-1 TABLETS BY MOUTH DAILY AS NEEDED FOR ERECTILE DYSFUNCTION. 04/28/23  Yes Levora Reyes SAUNDERS, MD  betamethasone  dipropionate 0.05 % cream Apply topically  daily. Patient not taking: Reported on 01/11/2024 07/17/21   Levora Reyes SAUNDERS, MD   Social History   Socioeconomic History   Marital status: Married    Spouse name: Not on file   Number of children: 4   Years of education: Not on file   Highest education level: Not on file  Occupational History   Occupation: Estate agent   Occupation: supervisor    Comment: [employer]  Tobacco Use   Smoking status: Never   Smokeless tobacco: Never  Vaping Use   Vaping status: Never Used   Substance and Sexual Activity   Alcohol use: Yes    Comment: special occasions   Drug use: No   Sexual activity: Yes    Birth control/protection: Condom  Other Topics Concern   Not on file  Social History Narrative   Single. Education: [employer]. Exercise: 3 days a week for 2 hours.   Social Drivers of Corporate investment banker Strain: Not on file  Food Insecurity: No Food Insecurity (11/09/2023)   Hunger Vital Sign    Worried About Running Out of Food in the Last Year: Never true    Ran Out of Food in the Last Year: Never true  Transportation Needs: No Transportation Needs (11/09/2023)   PRAPARE - Administrator, Civil Service (Medical): No    Lack of Transportation (Non-Medical): No  Physical Activity: Sufficiently Active (01/08/2024)   Exercise Vital Sign    Days of Exercise per Week: 4 days    Minutes of Exercise per Session: 90 min  Stress: No Stress Concern Present (01/08/2024)   [employer] of Occupational Health - Occupational Stress Questionnaire    Feeling of Stress: Not at all  Social Connections: Unknown (01/08/2024)   Social Connection and Isolation Panel    Frequency of Communication with Friends and Family: Patient declined    Frequency of Social Gatherings with Friends and Family: Patient declined    Attends Religious Services: Patient declined    Database administrator or Organizations: Patient declined    Attends Banker Meetings: Not on file    Marital Status: Patient declined  Intimate Partner Violence: Not At Risk (11/09/2023)   Humiliation, Afraid, Rape, and Kick questionnaire    Fear of Current or Ex-Partner: No    Emotionally Abused: No    Physically Abused: No    Sexually Abused: No    Review of Systems  Constitutional:  Negative for fatigue and unexpected weight change.  Eyes:  Negative for visual disturbance.  Respiratory:  Negative for cough, chest tightness and shortness of breath.   Cardiovascular:  Negative for  chest pain, palpitations and leg swelling.  Gastrointestinal:  Negative for abdominal pain and blood in stool.  Neurological:  Negative for dizziness, light-headedness and headaches.     Objective:   Vitals:   01/11/24 0906  BP: 116/80  Pulse: 73  Temp: 98.9 F (37.2 C)  TempSrc: Oral  SpO2: 97%  Weight: 161 lb 12.8 oz (73.4 kg)  Height: 5' 4 (1.626 m)     Physical Exam Vitals reviewed.  Constitutional:      Appearance: He is well-developed.  HENT:     Head: Normocephalic and atraumatic.  Neck:     Vascular: No carotid bruit or JVD.  Cardiovascular:     Rate and Rhythm: Normal rate and regular rhythm.     Heart sounds: Normal heart sounds. No murmur heard. Pulmonary:     Effort: Pulmonary  effort is normal.     Breath sounds: Normal breath sounds. No rales.  Musculoskeletal:     Right lower leg: No edema.     Left lower leg: No edema.  Skin:    General: Skin is warm and dry.  Neurological:     Mental Status: He is alert and oriented to person, place, and time.  Psychiatric:        Mood and Affect: Mood normal.     Assessment & Plan:  ASHAD FAWBUSH is a 63 y.o. male . Hoarseness - Plan: Ambulatory referral to Gastroenterology Gastroesophageal reflux disease, unspecified whether esophagitis present - Plan: Ambulatory referral to Gastroenterology  - Improved, still some breakthrough symptoms.  Continue PPI daily, recommended he take Pepcid nightly, and will refer to gastroenterology to decide if change in meds or possible repeat endoscopy indicated with prior esophagitis, gastritis on last testing 10 years ago.  Repeat labs noted for B12 and potassium which were normal.  Erectile dysfunction, unspecified erectile dysfunction type - Plan: sildenafil  (VIAGRA ) 100 MG tablet  - Refill requested.viagra  Rx given - use lowest effective dose. Side effects discussed (including but not limited to headache/flushing, blue discoloration of vision, possible vascular steal  and risk of cardiac effects if underlying unknown coronary artery disease, and permanent sensorineural hearing loss). Understanding expressed.   Meds ordered this encounter  Medications   sildenafil  (VIAGRA ) 100 MG tablet    Sig: TAKE 0.5-1 TABLETS BY MOUTH DAILY AS NEEDED FOR ERECTILE DYSFUNCTION.    Dispense:  5 tablet    Refill:  11   Patient Instructions  Try taking pepcid nightly, stay on omeprazole  daily, and I will refer you to gastroenterology.      Signed,   Reyes Pines, MD  Primary Care, [facility] 01/11/24 9:55 AM

## 2024-01-18 ENCOUNTER — Encounter: Payer: Self-pay | Admitting: Urology

## 2024-01-18 ENCOUNTER — Ambulatory Visit: Payer: 59 | Admitting: Urology

## 2024-01-18 VITALS — BP 128/84 | HR 67 | Ht 64.0 in | Wt 159.0 lb

## 2024-01-18 DIAGNOSIS — N4232 Atypical small acinar proliferation of prostate: Secondary | ICD-10-CM

## 2024-01-18 DIAGNOSIS — R972 Elevated prostate specific antigen [PSA]: Secondary | ICD-10-CM | POA: Diagnosis not present

## 2024-01-18 LAB — URINALYSIS, ROUTINE W REFLEX MICROSCOPIC
Bilirubin, UA: NEGATIVE
Glucose, UA: NEGATIVE
Ketones, UA: NEGATIVE
Leukocytes,UA: NEGATIVE
Nitrite, UA: NEGATIVE
Protein,UA: NEGATIVE
RBC, UA: NEGATIVE
Specific Gravity, UA: 1.015 (ref 1.005–1.030)
Urobilinogen, Ur: 0.2 mg/dL (ref 0.2–1.0)
pH, UA: 7 (ref 5.0–7.5)

## 2024-01-18 NOTE — Progress Notes (Signed)
 Assessment: 1. Elevated PSA   2. Atypical small acinar proliferation of prostate     Plan: PSA today Return to office in 6 months  Chief Complaint:  Chief Complaint  Patient presents with   Elevated PSA   History of Present Illness:  Justin Farley is a 63 y.o. male who is seen for continued evaluation of elevated PSA and ASAP on prostate biopsy.  PSA results: 9/14 2.55 8/16 2.66 8/17 4.0 10/16 3.8 5/19 3.9 10/19 3.9 11/20 3.5 2/23 3.97 3/24 4.38   No history of prostate biopsy.  No history of UTIs or prostatitis.  He has a family history of prostate cancer in a maternal uncle.  No dysuria or gross hematuria. IPSS = 2.   He was previously seen in 12/23 for a vasectomy evaluation.   He is married with children.  His wife is 80 and may be peri or post menopausal.  No history of scrotal trauma or infection.   He underwent a transrectal ultrasound and biopsy of the prostate on 09/09/22. 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    IPSS = 2 Patient without erectile dysfunction.    isoPSA from 7/24:  PSA 3.79 PSA 10/24:  3.6  Prostate MRI from 05/08/2023 showed no focal lesion of intermediate or higher suspicion for prostate cancer, BPH with a volume of 64 cm. 4K score from 2/25 showed a PSA of 3.73 and 4K score of 1.4 indicating a low risk of prostate cancer.  At his visit in February 2025, he reported no new urinary symptoms.  No dysuria or gross hematuria. IPSS = 2. He continued to use sildenafil  on occasion for erectile dysfunction.  He returns today for follow-up.  No change in his lower urinary tract symptoms.  He reports nocturia x 1 and occasional frequency.  No dysuria or gross hematuria. IPSS =  2/1.  Portions of the above documentation were copied from a prior visit for review purposes only.     Past Medical History:  Past Medical History:  Diagnosis Date   Allergy    Chronic leukopenia 11/09/2023   Colon polyps    Esophagitis    Gastritis    GERD (gastroesophageal reflux disease)    Hypertension    Internal hemorrhoids with grade 2 prolapse 11/28/2014    Past Surgical History:  Past Surgical History:  Procedure Laterality Date   COLONOSCOPY     ESOPHAGOGASTRODUODENOSCOPY     HEMORRHOID BANDING     WISDOM TOOTH EXTRACTION  2009    Allergies:  Allergies  Allergen Reactions   Lamisil  [Terbinafine ]     Sharp stomach pain   Shellfish Allergy Swelling    Family History:  Family History  Problem Relation Age of Onset   Hypertension Mother    Hypertension Sister    Diabetes Brother    Prostate cancer Maternal Uncle    Colon cancer Neg Hx    Esophageal cancer Neg Hx    Rectal cancer Neg Hx    Stomach cancer Neg Hx     Social History:  Social History   Tobacco Use   Smoking status: Never   Smokeless tobacco: Never  Vaping Use  Vaping status: Never Used  Substance Use Topics   Alcohol use: Yes    Comment: special occasions   Drug use: No    ROS: Constitutional:  Negative for fever, chills, weight loss CV: Negative for chest pain, previous MI, hypertension Respiratory:  Negative for shortness of breath, wheezing, sleep apnea, frequent cough GI:  Negative for nausea, vomiting, bloody stool, GERD  Physical exam: BP 128/84   Pulse 67   Ht 5' 4 (1.626 m)   Wt 159 lb (72.1 kg)   BMI 27.29 kg/m  GENERAL APPEARANCE:  Well appearing, well developed, well nourished, NAD HEENT:  Atraumatic, normocephalic, oropharynx clear NECK:  Supple without lymphadenopathy or thyromegaly ABDOMEN:  Soft, non-tender, no masses EXTREMITIES:  Moves all extremities well, without clubbing, cyanosis, or edema NEUROLOGIC:  Alert and oriented x 3, normal gait, CN  II-XII grossly intact MENTAL STATUS:  appropriate BACK:  Non-tender to palpation, No CVAT SKIN:  Warm, dry, and intact  Results: U/A:

## 2024-01-19 ENCOUNTER — Ambulatory Visit: Payer: Self-pay | Admitting: Urology

## 2024-01-19 LAB — PSA: Prostate Specific Ag, Serum: 4.1 ng/mL — ABNORMAL HIGH (ref 0.0–4.0)

## 2024-01-26 ENCOUNTER — Telehealth: Payer: Self-pay

## 2024-01-26 NOTE — Telephone Encounter (Signed)
 LVM requesting patient to callback to schedule 12-month labs and follow-up visit with Dr. Lonn. Callback number provided.

## 2024-01-26 NOTE — Telephone Encounter (Signed)
 S/w patient to schedule appointments per 6/12 LOS.  Patient confirmed new appointment dates and times.

## 2024-02-25 ENCOUNTER — Encounter: Payer: Self-pay | Admitting: Internal Medicine

## 2024-03-03 ENCOUNTER — Ambulatory Visit: Admitting: Internal Medicine

## 2024-03-03 ENCOUNTER — Encounter: Payer: Self-pay | Admitting: Internal Medicine

## 2024-03-03 VITALS — BP 114/68 | HR 85 | Ht 64.0 in | Wt 162.0 lb

## 2024-03-03 DIAGNOSIS — K2100 Gastro-esophageal reflux disease with esophagitis, without bleeding: Secondary | ICD-10-CM

## 2024-03-03 DIAGNOSIS — R490 Dysphonia: Secondary | ICD-10-CM | POA: Diagnosis not present

## 2024-03-03 MED ORDER — OMEPRAZOLE 40 MG PO CPDR: 40.0000 mg | DELAYED_RELEASE_CAPSULE | Freq: Two times a day (BID) | ORAL | 5 refills | Status: AC

## 2024-03-03 NOTE — Progress Notes (Signed)
 Justin Farley 63 y.o. 1960/09/08 [id number]  Assessment & Plan:   Encounter Diagnoses  Name Primary?   Hoarseness Yes   Gastroesophageal reflux disease with esophagitis without hemorrhage    Gastroesophageal reflux disease with esophagitis, unspecified whether hemorrhage     Refer to ENT specialist for evaluation of hoarseness    Since it is possible this is related to GERD will increase omeprazole  to 40 mg twice daily, before breakfast and before supper.  Continue to avoid caffeine and nicotine, GERD diet instructions provided to the patient   I appreciate the opportunity to care for this patient. CC: Justin Reyes SAUNDERS, MD        Subjective:   Chief Complaint: Hoarseness question related to GERD  HPI Discussed the use of AI scribe software for clinical note transcription with the patient, who gave verbal consent to proceed.  Justin Farley is a 63 year old male with a history of acid reflux who presents with hoarseness for several months at least.  He has been experiencing hoarseness for the past few months. He takes omeprazole  40 mg in the morning before breakfast, which he feels is working the same as it has in the past. Despite this, he continues to experience some heartburn, particularly after consuming certain foods, including non-spicy options like baked chicken.  This is rare, however.  He does not have any dysphagia.  GI review of systems is otherwise negative.  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 No swallowing issues or frequent throat clearing. He denies smoking or tobacco use and only consumes alcohol occasionally.  He has a history of sinus issues, which he notes his father also had, but he does not feel that sinus drainage significantly affects him. No sore throat except when sneezing, and he does not experience post-nasal drip.      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   cyanocobalamin  (VITAMIN B12) 1000 MCG tablet Take 1,000 mcg by mouth daily.   hydrochlorothiazide  (MICROZIDE ) 12.5 MG capsule TAKE 1 CAPSULE BY MOUTH EVERY DAY IN THE MORNING   sildenafil  (VIAGRA ) 100 MG tablet TAKE 0.5-1 TABLETS BY MOUTH DAILY AS NEEDED FOR ERECTILE DYSFUNCTION.    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 Education: [employer]. Exercise: 3 days a week for 2 hours.  family history includes Cancer in his maternal uncle, maternal uncle, and mother; Diabetes in his brother; Hypertension in his mother and sister; Kidney disease in his mother; Prostate cancer in his maternal uncle.   Review of Systems As per HPI otherwise negative  Objective:   Physical Exam @BP  114/68   Pulse 85   Ht 5' 4 (1.626 m)   Wt 162 lb (73.5 kg)   BMI 27.81 kg/m @  General:  Well-developed, well-nourished and in no acute distress Eyes:  anicteric. ENT:   Mouth and posterior pharynx free of lesions.  Neck:   supple w/o  thyromegaly or mass.  Lungs: Clear to auscultation bilaterally. Heart:   S1S2, no rubs, murmurs, gallops. Abdomen:  soft, non-tender, no hepatosplenomegaly, hernia, or mass and BS+.  Lymph:  no cervical or supraclavicular adenopathy. Neuro:  A&O x 3.  Psych:  appropriate mood and  Affect.   Data Reviewed: See HPI

## 2024-03-03 NOTE — Patient Instructions (Signed)
 We have placed a referral for you to see Dr Karis (ENT). You should hear from their office with an appointment within the next 1-2 weeks. If you do not, please let us  know by calling [phone].  We have sent the following medications to your pharmacy for you to pick up at your convenience: Omeprazole  40 mg twice daily before breakfast and dinner  We have given you a GERD handout to look over and follow.  _______________________________________________________  If your blood pressure at your visit was 140/90 or greater, please contact your primary care physician to follow up on this.  _______________________________________________________  If you are age 77 or older, your body mass index should be between 23-30. Your Body mass index is 27.81 kg/m. If this is out of the aforementioned range listed, please consider follow up with your Primary Care Provider.  If you are age 63 or younger, your body mass index should be between 19-25. Your Body mass index is 27.81 kg/m. If this is out of the aformentioned range listed, please consider follow up with your Primary Care Provider.   ________________________________________________________  The [facility] providers would like to encourage you to use MYCHART to communicate with providers for non-urgent requests or questions.  Due to long hold times on the telephone, sending your provider a message by [facility] may be a faster and more efficient way to get a response.  Please allow 48 business hours for a response.  Please remember that this is for non-urgent requests.  _______________________________________________________  [facility] is using a team-based approach to care.  Your team is made up of your doctor and two to three APPS. Our APPS (Nurse Practitioners and Physician Assistants) work with your physician to ensure care continuity for you. They are fully qualified to address your health concerns and develop a treatment plan. They  communicate directly with your gastroenterologist to care for you. Seeing the Advanced Practice Practitioners on your physician's team can help you by facilitating care more promptly, often allowing for earlier appointments, access to diagnostic testing, procedures, and other specialty referrals.   Due to recent changes in healthcare laws, you may see the results of your imaging and laboratory studies on MyChart before your provider has had a chance to review them.  We understand that in some cases there may be results that are confusing or concerning to you. Not all laboratory results come back in the same time frame and the provider may be waiting for multiple results in order to interpret others.  Please give us  48 hours in order for your provider to thoroughly review all the results before contacting the office for clarification of your results.   Thank you for choosing me and [facility].  Lupita CHARLENA Commander, MD, NOLIA

## 2024-03-04 ENCOUNTER — Other Ambulatory Visit (HOSPITAL_COMMUNITY): Payer: Self-pay

## 2024-03-04 ENCOUNTER — Encounter (INDEPENDENT_AMBULATORY_CARE_PROVIDER_SITE_OTHER): Payer: Self-pay

## 2024-03-04 ENCOUNTER — Telehealth: Payer: Self-pay

## 2024-03-04 NOTE — Telephone Encounter (Signed)
 Pharmacy Patient Advocate Encounter   Received notification from Patient Pharmacy that prior authorization for Omeprazole  40MG  dr capsules is required/requested.   Insurance verification completed.   The patient is insured through [facility].   Per test claim: PA required; PA submitted to above mentioned insurance via Latent Key/confirmation #/EOC [id number] Status is pending

## 2024-03-04 NOTE — Telephone Encounter (Signed)
 Pharmacy Patient Advocate Encounter  Received notification from [employer] that Prior Authorization for Omeprazole  40MG  dr capsules has been APPROVED from 03-04-2024 to 03-04-2025. Ran test claim, Copay is $9.73. This test claim was processed through [facility]- copay amounts may vary at other pharmacies due to pharmacy/plan contracts, or as the patient moves through the different stages of their insurance plan.   PA #/Case ID/Reference #: [id number]

## 2024-03-09 ENCOUNTER — Ambulatory Visit: Admitting: Family Medicine

## 2024-03-09 VITALS — BP 104/68 | HR 72 | Temp 98.5°F | Resp 16 | Ht 64.0 in | Wt 164.8 lb

## 2024-03-09 DIAGNOSIS — R7303 Prediabetes: Secondary | ICD-10-CM

## 2024-03-09 DIAGNOSIS — Z23 Encounter for immunization: Secondary | ICD-10-CM | POA: Diagnosis not present

## 2024-03-09 DIAGNOSIS — K219 Gastro-esophageal reflux disease without esophagitis: Secondary | ICD-10-CM

## 2024-03-09 DIAGNOSIS — I10 Essential (primary) hypertension: Secondary | ICD-10-CM

## 2024-03-09 DIAGNOSIS — N529 Male erectile dysfunction, unspecified: Secondary | ICD-10-CM | POA: Diagnosis not present

## 2024-03-09 LAB — COMPREHENSIVE METABOLIC PANEL WITH GFR
ALT: 27 U/L (ref 0–53)
AST: 40 U/L — ABNORMAL HIGH (ref 0–37)
Albumin: 4.9 g/dL (ref 3.5–5.2)
Alkaline Phosphatase: 31 U/L — ABNORMAL LOW (ref 39–117)
BUN: 19 mg/dL (ref 6–23)
CO2: 31 meq/L (ref 19–32)
Calcium: 10 mg/dL (ref 8.4–10.5)
Chloride: 99 meq/L (ref 96–112)
Creatinine, Ser: 1.22 mg/dL (ref 0.40–1.50)
GFR: 64.22 mL/min (ref >60.00)
Glucose, Bld: 79 mg/dL (ref 70–99)
Potassium: 4.6 meq/L (ref 3.5–5.1)
Sodium: 136 meq/L (ref 135–145)
Total Bilirubin: 1 mg/dL (ref 0.2–1.2)
Total Protein: 8.2 g/dL (ref 6.0–8.3)

## 2024-03-09 LAB — HEMOGLOBIN A1C: Hgb A1c MFr Bld: 6 % (ref 4.6–6.5)

## 2024-03-09 MED ORDER — SILDENAFIL CITRATE 100 MG PO TABS: ORAL_TABLET | ORAL | 11 refills | Status: AC

## 2024-03-09 NOTE — Patient Instructions (Signed)
 Thank you for coming in today. No change in medications at this time. If there are any concerns on your bloodwork, I will let you know. Take care!

## 2024-03-09 NOTE — Progress Notes (Signed)
 Subjective:  Patient ID: Justin Farley, male    DOB: 03-Feb-1961  Age: 62 y.o. MRN: [id number]  CC:  Chief Complaint  Patient presents with   Gastroesophageal Reflux   Erectile Dysfunction    No questions or concerns.     HPI Justin Farley presents for follow-up.  We discussed erectile dysfunction and meds refilled in August.  [place] planned for his birthday in a few days.  Plans covid booster at pharmacy   GERD/reflux/hoarseness Discussed at his August 11 visit.  Had previously been discussed in July.  Hoarseness thought to be related to his reflux.  He was continued on PPI and added Pepcid once per day.  Still some intermittent breakthrough heartburn 1-2 times per week when discussed in August.  Only intermittent dosing of Pepcid 1-2 times per week at that time.  Was taking omeprazole  every morning.  Prior endoscopy in March 2015 with mild reflux related esophagitis and mild distal gastritis. Recommended taking Pepcid nightly, and referred to gastroenterology..  Office visit noted with Dr. Avram on 03/03/2024.  Omeprazole  40 mg changed to twice per day.  Referred to Dr. Karis with ENT to evaluate hoarseness. Some improvement in heartburn with BID PPI dosing. Has not received appt with ENT yet.   Prediabetes: Diet/exercise approach.  Weight overall stable, 165 in April. Lab Results  Component Value Date   HGBA1C 5.8 09/03/2023   Wt Readings from Last 3 Encounters:  03/09/24 164 lb 12.8 oz (74.8 kg)  03/03/24 162 lb (73.5 kg)  01/18/24 159 lb (72.1 kg)   Hypertension: Hydrochlorothiazide  12.5 mg daily, no new side effects.  No lightheadedness/dizziness Home readings: low 100's/60's.  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/15/2022   Elevated PSA 08/25/2022   Internal hemorrhoids with grade 2 prolapse 11/28/2014   HTN (hypertension) 04/19/2013   GERD (gastroesophageal reflux disease) 04/19/2013   Past Medical History:  Diagnosis Date   Allergy    Chronic leukopenia 11/09/2023   Colon polyps    Esophagitis    Gastritis    GERD (gastroesophageal reflux disease)    Hypertension    Internal hemorrhoids with grade 2 prolapse 11/28/2014   Past Surgical History:  Procedure Laterality Date   COLONOSCOPY     ESOPHAGOGASTRODUODENOSCOPY     HEMORRHOID BANDING     WISDOM TOOTH EXTRACTION  2009   Allergies  Allergen Reactions   Lamisil  [Terbinafine ]     Sharp stomach pain   Shellfish Allergy Swelling   Prior to Admission medications   Medication Sig Start Date End Date Taking? Authorizing Provider  albuterol  (VENTOLIN  HFA) 108 (90 Base) MCG/ACT inhaler Inhale 2 puffs into the lungs every 4 (four) hours as needed for wheezing. 10/03/19  Yes Levora Reyes SAUNDERS, MD  amoxicillin (AMOXIL) 500 MG capsule Take 500 mg by mouth 3 (three) times daily. 03/03/24  Yes [provider]  cyanocobalamin  (VITAMIN B12) 1000 MCG tablet Take 1,000 mcg by mouth daily.   Yes [provider]  hydrochlorothiazide  (MICROZIDE ) 12.5 MG capsule TAKE 1 CAPSULE BY MOUTH EVERY DAY IN THE MORNING 09/07/23  Yes Levora Reyes SAUNDERS, MD  omeprazole  (PRILOSEC) 40 MG capsule Take 1 capsule (40 mg total) by mouth 2 (two) times daily before a meal. TAKE 1 CAPSULE (40 MG TOTAL) BY MOUTH DAILY.as needed 03/03/24  Yes Avram Lupita BRAVO, MD  sildenafil  (VIAGRA ) 100 MG tablet TAKE 0.5-1 TABLETS BY MOUTH DAILY AS NEEDED FOR ERECTILE DYSFUNCTION. 01/11/24  Yes Levora Reyes SAUNDERS, MD   Social History   Socioeconomic History   Marital status: Married    Spouse name: Not on file   Number of  children: 4   Years of education: Not on file   Highest education level: Not on file  Occupational History   Occupation: Estate agent   Occupation: supervisor    Comment: [employer]  Tobacco Use   Smoking status: Never   Smokeless tobacco: Never  Vaping Use   Vaping status: Never Used  Substance and Sexual Activity   Alcohol use: Yes    Comment: special occasions   Drug use: No   Sexual activity: Yes    Birth control/protection: Condom  Other Topics Concern   Not on file  Social History Narrative   Married-2 sons 2 daughters   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 Education: [employer]. Exercise: 3 days a week for 2 hours.   Social Drivers of Corporate investment banker Strain: Not on file  Food Insecurity: No Food Insecurity (11/09/2023)   Hunger Vital Sign    Worried About Running Out of Food in the Last Year: Never true    Ran Out of Food in the Last Year: Never true  Transportation Needs: No Transportation Needs (11/09/2023)   PRAPARE - Administrator, Civil Service (Medical): No    Lack of Transportation (Non-Medical): No  Physical Activity: Sufficiently Active (01/08/2024)   Exercise Vital Sign    Days of Exercise per Week: 4 days    Minutes of Exercise per Session: 90 min  Stress: No Stress Concern Present (01/08/2024)   [employer] of Occupational Health - Occupational Stress Questionnaire    Feeling of Stress: Not at all  Social Connections: Unknown (01/08/2024)   Social Connection and Isolation Panel    Frequency of Communication with Friends and Family: Patient declined    Frequency of Social Gatherings with Friends and Family: Patient declined    Attends Religious Services: Patient declined    Database administrator or Organizations: Patient declined    Attends Banker Meetings: Not on file    Marital Status: Patient declined   Intimate Partner Violence: Not At Risk (11/09/2023)   Humiliation, Afraid, Rape, and Kick questionnaire    Fear of Current or Ex-Partner: No    Emotionally Abused: No    Physically Abused: No    Sexually Abused: No    Review of Systems  Constitutional:  Negative for fatigue and unexpected weight change.  Eyes:  Negative for visual disturbance.  Respiratory:  Negative for cough, chest tightness and shortness of breath.   Cardiovascular:  Negative for chest pain, palpitations and leg swelling.  Gastrointestinal:  Negative for abdominal pain and blood in stool.  Neurological:  Negative for dizziness, light-headedness and headaches.     Objective:   Vitals:   03/09/24 0848  BP: 104/68  Pulse: 72  Resp: 16  Temp: 98.5 F (36.9 C)  TempSrc: Temporal  SpO2:  100%  Weight: 164 lb 12.8 oz (74.8 kg)  Height: 5' 4 (1.626 m)     Physical Exam Vitals reviewed.  Constitutional:      Appearance: He is well-developed.  HENT:     Head: Normocephalic and atraumatic.  Neck:     Vascular: No carotid bruit or JVD.  Cardiovascular:     Rate and Rhythm: Normal rate and regular rhythm.     Heart sounds: Normal heart sounds. No murmur heard. Pulmonary:     Effort: Pulmonary effort is normal.     Breath sounds: Normal breath sounds. No rales.  Musculoskeletal:     Right lower leg: No edema.     Left lower leg: No edema.  Skin:    General: Skin is warm and dry.  Neurological:     Mental Status: He is alert and oriented to person, place, and time.  Psychiatric:        Mood and Affect: Mood normal.        Assessment & Plan:  Justin Farley is a 63 y.o. male . Prediabetes - Plan: Comprehensive metabolic panel with GFR, Hemoglobin A1c  - Diet/exercise approach.  Continue same, check labs and adjust plan accordingly  Need for influenza vaccination - Plan: Flu vaccine trivalent PF, 6mos and older(Flulaval,Afluria,Fluarix,Fluzone)  Erectile dysfunction, unspecified erectile  dysfunction type - Plan: sildenafil  (VIAGRA ) 100 MG tablet  - viagra  Rx given - use lowest effective dose. Side effects discussed (including but not limited to headache/flushing, blue discoloration of vision, possible vascular steal and risk of cardiac effects if underlying unknown coronary artery disease, and permanent sensorineural hearing loss). Understanding expressed.  Essential hypertension - Plan: Comprehensive metabolic panel with GFR  - Stable with current regimen, check labs and adjust plan accordingly  Gastroesophageal reflux disease, unspecified whether esophagitis present  - On twice daily PPI as above, with some improvement in symptoms.  Plan for ENT follow-up.  Meds ordered this encounter  Medications   sildenafil  (VIAGRA ) 100 MG tablet    Sig: TAKE 0.5-1 TABLETS BY MOUTH DAILY AS NEEDED FOR ERECTILE DYSFUNCTION.    Dispense:  5 tablet    Refill:  11   Patient Instructions  Thank you for coming in today. No change in medications at this time. If there are any concerns on your bloodwork, I will let you know. Take care!     Signed,   Reyes Pines, MD [facility], [facility] 03/09/24 9:20 AM

## 2024-03-13 ENCOUNTER — Encounter: Payer: Self-pay | Admitting: Family Medicine

## 2024-03-16 ENCOUNTER — Ambulatory Visit: Payer: Self-pay | Admitting: Family Medicine

## 2024-03-22 NOTE — Telephone Encounter (Signed)
 Copied from CRM #[id number]. Topic: Clinical - Lab/Test Results >> Mar 22, 2024 11:26 AM Justin Farley wrote: Reason for CRM: Patient called in regarding missed call about results, relay results patient stated he understood and had no further questions

## 2024-03-22 NOTE — Progress Notes (Signed)
 Called patient and left vm to return call in regards to lab results.

## 2024-04-12 ENCOUNTER — Encounter (INDEPENDENT_AMBULATORY_CARE_PROVIDER_SITE_OTHER): Payer: Self-pay

## 2024-04-12 ENCOUNTER — Ambulatory Visit (INDEPENDENT_AMBULATORY_CARE_PROVIDER_SITE_OTHER)

## 2024-04-12 VITALS — BP 126/79 | HR 82 | Ht 64.0 in | Wt 163.0 lb

## 2024-04-12 DIAGNOSIS — K219 Gastro-esophageal reflux disease without esophagitis: Secondary | ICD-10-CM | POA: Diagnosis not present

## 2024-04-12 DIAGNOSIS — R0981 Nasal congestion: Secondary | ICD-10-CM | POA: Diagnosis not present

## 2024-04-12 DIAGNOSIS — H61893 Other specified disorders of external ear, bilateral: Secondary | ICD-10-CM

## 2024-04-12 DIAGNOSIS — R490 Dysphonia: Secondary | ICD-10-CM

## 2024-04-12 MED ORDER — FAMOTIDINE 20 MG PO TABS: 20.0000 mg | ORAL_TABLET | Freq: Two times a day (BID) | ORAL | 1 refills | Status: DC | PRN

## 2024-04-12 NOTE — Progress Notes (Signed)
 Dear Dr. Levora, Here is my assessment for our mutual patient, Justin Farley. Thank you for allowing me the opportunity to care for your patient. Please do not hesitate to contact me should you have any other questions. Sincerely, Dr. Penne Croak  Otolaryngology Clinic Note Referring provider: Dr. Levora HPI:  Discussed the use of AI scribe software for clinical note transcription with the patient, who gave verbal consent to proceed.  History of Present Illness Justin Farley is a 63 year old male with acid reflux who presents with hoarseness. He was referred by Dr. Landy for evaluation of hoarseness.  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  Gastroesophageal reflux symptoms and management - Acid reflux managed with omeprazole  40 mg twice daily - Heartburn present as a symptom of reflux - Spicy foods identified as a trigger; avoids them - Enjoys baked chicken, often purchased from [employer]  Associated otolaryngologic symptoms - No significant mucus production - No ear itching  Substance use - No smoking - Occasional alcohol consumption, such as on anniversaries   Independent Review of Additional Tests or Records:  Reviewed external note from referring PCP, Greene,describing relevant history incorporated into today's evaluation. I personally reviewed medicaitons - omeprazole  40mg  BID   PMH/Meds/All/SocHx/FamHx/ROS:   Past Medical History:  Diagnosis Date   Allergy    Chronic leukopenia 11/09/2023   Colon polyps    Esophagitis    Gastritis    GERD (gastroesophageal reflux disease)    Hypertension    Internal hemorrhoids with grade 2 prolapse 11/28/2014     Past Surgical History:   Procedure Laterality Date   COLONOSCOPY     ESOPHAGOGASTRODUODENOSCOPY     HEMORRHOID BANDING     WISDOM TOOTH EXTRACTION  2009    Family History  Problem Relation Age of Onset   Hypertension Mother    Cancer Mother    Kidney disease Mother    Hypertension Sister    Diabetes Brother    Prostate cancer Maternal Uncle    Cancer Maternal Uncle    Cancer Maternal Uncle    Colon cancer Neg Hx    Esophageal cancer Neg Hx    Rectal cancer Neg Hx    Stomach cancer Neg Hx      Social Connections: Unknown (01/08/2024)   Social Connection and Isolation Panel    Frequency of Communication with Friends and Family: Patient declined    Frequency of Social Gatherings with Friends and Family: Patient declined    Attends Religious Services: Patient declined    Database Administrator or Organizations: Patient declined    Attends Engineer, Structural: Not on file    Marital Status: Patient declined      Current Outpatient Medications:    albuterol  (VENTOLIN  HFA) 108 (90 Base) MCG/ACT inhaler, Inhale 2 puffs into the lungs every 4 (four) hours as needed for wheezing., Disp: 18 g, Rfl: 1   cyanocobalamin  (VITAMIN B12) 1000 MCG tablet, Take 1,000 mcg by mouth daily., Disp: , Rfl:    famotidine (PEPCID) 20 MG tablet, Take 1 tablet (20 mg total) by mouth 2 (two) times daily as needed for heartburn or indigestion., Disp: 60 tablet, Rfl: 1   hydrochlorothiazide  (MICROZIDE ) 12.5 MG capsule, TAKE 1 CAPSULE  BY MOUTH EVERY DAY IN THE MORNING, Disp: 30 capsule, Rfl: 11   omeprazole  (PRILOSEC) 40 MG capsule, Take 1 capsule (40 mg total) by mouth 2 (two) times daily before a meal. TAKE 1 CAPSULE (40 MG TOTAL) BY MOUTH DAILY.as needed, Disp: 60 capsule, Rfl: 5   sildenafil  (VIAGRA ) 100 MG tablet, TAKE 0.5-1 TABLETS BY MOUTH DAILY AS NEEDED FOR ERECTILE DYSFUNCTION., Disp: 5 tablet, Rfl: 11   amoxicillin (AMOXIL) 500 MG capsule, Take 500 mg by mouth 3 (three) times daily. (Patient not taking: Reported  on 04/12/2024), Disp: , Rfl:    Physical Exam:   BP 126/79 (BP Location: Right Arm, Patient Position: Sitting, Cuff Size: Normal)   Pulse 82   Ht 5' 4 (1.626 m)   Wt 163 lb (73.9 kg)   SpO2 97%   BMI 27.98 kg/m   The patient was awake, alert, and appropriate. The external ears were inspected, and otoscopy was performed to evaluate the external auditory canals and tympanic membranes. The nasal cavity and septum were examined for mucosal changes, obstruction, or discharge. The oral cavity and oropharynx were inspected for mucosal lesions, infection, or tonsillar hypertrophy. The neck was palpated for lymphadenopathy, thyroid  abnormalities, or other masses. Cranial nerve function was grossly intact.  Pertinent Findings: Physical Exam HEENT: Ears appear dry. Nasal septum deviated. Larynx and vocal cords visualized with redness in the throat likely due to reflux, compared to normal pink.   Seprately Identifiable Procedures:  I personally ordered, reviewed and interpreted the following with the patient today  Procedure Note Pre-procedure diagnosis:  Dysphonia  Post-procedure diagnosis: Same Procedure: Transnasal Fiberoptic Laryngoscopy, CPT 31575 - Mod 25 Indication: dysphonia Complications: None apparent EBL: 0 mL  The procedure was undertaken to further evaluate the patient's complaint of dysphonia, with mirror exam inadequate for appropriate examination due to gag reflex and poor patient tolerance  Procedure:  Patient was identified as correct patient. Verbal consent was obtained. The nose was sprayed with oxymetazoline and 4% lidocaine. The The flexible laryngoscope was passed through the nose to view the nasal cavity, pharynx (oropharynx, hypopharynx) and larynx.  The larynx was examined at rest and during multiple phonatory tasks. Documentation was obtained and reviewed with patient. The scope was removed. The patient tolerated the procedure well.  Findings: The nasal cavity and  nasopharynx did not reveal any masses or lesions, mucosa appeared to be without obvious lesions. The tongue base, pharyngeal walls, piriform sinuses, vallecula, epiglottis and postcricoid region are normal in appearance EXCEPT: arytenoid erythema, supraglottic recruitment with phonation. The visualized portion of the subglottis and proximal trachea is widely patent. The vocal folds are mobile bilaterally. There are no lesions on the free edge of the vocal folds nor elsewhere in the larynx worrisome for malignancy.      Electronically signed by: Penne Croak, DO 04/12/2024 3:15 PM   Impression & Plans:  Justin Farley is a 63 y.o. male  1. LPRD (laryngopharyngeal reflux disease)   2. Hoarseness   3. Gastroesophageal reflux disease without esophagitis    - Findings and diagnoses discussed in detail with the patient. No evidence of malignancy. - Risks, benefits, and alternatives were reviewed. Through shared decision making, the patient elects to proceed with below.  Assessment & Plan Laryngopharyngeal reflux with hoarseness Intermittent hoarseness likely due to laryngopharyngeal reflux. Laryngeal redness observed, no malignancy. Mild supraglottic recruitment present. - Prescribed famotidine for breakthrough symptoms, to be taken as needed, especially day before singing or after consuming rich or fatty foods. - Discussed  speech therapy for voice relaxation and projection, not immediately necessary. - Scheduled follow-up in three months.  Dry ear canals Likely due to frequent Q-tip use removing natural oils. - Advised application of baby oil or sweet oil to ear canals.  Nasal congestion Mild nasal congestion observed.  - Orders placed: No orders of the defined types were placed in this encounter.  - Medications prescribed/continued/adjusted:  Meds ordered this encounter  Medications   famotidine (PEPCID) 20 MG tablet    Sig: Take 1 tablet (20 mg total) by mouth 2 (two) times daily  as needed for heartburn or indigestion.    Dispense:  60 tablet    Refill:  1   - Education materials provided to the patient. - Follow up: 3 months. Patient instructed to return sooner or go to the ED if new/worsening symptoms develop.   Thank you for allowing me the opportunity to care for your patient. Please do not hesitate to contact me should you have any other questions.  Sincerely, Penne Croak, DO Otolaryngologist (ENT) [facility] Phone: [phone] Fax: [phone]  04/12/2024, 3:15 PM

## 2024-05-03 ENCOUNTER — Inpatient Hospital Stay: Attending: Hematology and Oncology

## 2024-05-03 DIAGNOSIS — D72819 Decreased white blood cell count, unspecified: Secondary | ICD-10-CM | POA: Insufficient documentation

## 2024-05-03 DIAGNOSIS — R7689 Other specified abnormal immunological findings in serum: Secondary | ICD-10-CM

## 2024-05-03 DIAGNOSIS — R7989 Other specified abnormal findings of blood chemistry: Secondary | ICD-10-CM | POA: Diagnosis not present

## 2024-05-03 LAB — BASIC METABOLIC PANEL - CANCER CENTER ONLY
Anion gap: 9 (ref 5–15)
BUN: 14 mg/dL (ref 8–23)
CO2: 27 mmol/L (ref 22–32)
Calcium: 9.4 mg/dL (ref 8.9–10.3)
Chloride: 106 mmol/L (ref 98–111)
Creatinine: 1.13 mg/dL (ref 0.61–1.24)
GFR, Estimated: >60 mL/min (ref >60)
Glucose, Bld: 82 mg/dL (ref 70–99)
Potassium: 4.4 mmol/L (ref 3.5–5.1)
Sodium: 142 mmol/L (ref 135–145)

## 2024-05-03 LAB — IRON AND IRON BINDING CAPACITY (CC-WL,HP ONLY)
Iron: 80 ug/dL (ref 45–182)
Saturation Ratios: 21 % (ref 17.9–39.5)
TIBC: 381 ug/dL (ref 250–450)
UIBC: 301 ug/dL

## 2024-05-03 LAB — CBC WITH DIFFERENTIAL (CANCER CENTER ONLY)
Abs Immature Granulocytes: 0 K/uL (ref 0.00–0.07)
Basophils Absolute: 0 K/uL (ref 0.0–0.1)
Basophils Relative: 1 %
Eosinophils Absolute: 0 K/uL (ref 0.0–0.5)
Eosinophils Relative: 0 %
HCT: 43 % (ref 39.0–52.0)
Hemoglobin: 14.6 g/dL (ref 13.0–17.0)
Immature Granulocytes: 0 %
Lymphocytes Relative: 46 %
Lymphs Abs: 1.7 K/uL (ref 0.7–4.0)
MCH: 29.5 pg (ref 26.0–34.0)
MCHC: 34 g/dL (ref 30.0–36.0)
MCV: 86.9 fL (ref 80.0–100.0)
Monocytes Absolute: 0.3 K/uL (ref 0.1–1.0)
Monocytes Relative: 9 %
Neutro Abs: 1.6 K/uL — ABNORMAL LOW (ref 1.7–7.7)
Neutrophils Relative %: 44 %
Platelet Count: 182 K/uL (ref 150–400)
RBC: 4.95 MIL/uL (ref 4.22–5.81)
RDW: 13.2 % (ref 11.5–15.5)
WBC Count: 3.6 K/uL — ABNORMAL LOW (ref 4.0–10.5)
nRBC: 0 % (ref 0.0–0.2)

## 2024-05-03 LAB — SEDIMENTATION RATE: Sed Rate: 4 mm/h (ref 0–16)

## 2024-05-03 LAB — FERRITIN: Ferritin: 511 ng/mL — ABNORMAL HIGH (ref 24–336)

## 2024-05-04 ENCOUNTER — Other Ambulatory Visit (INDEPENDENT_AMBULATORY_CARE_PROVIDER_SITE_OTHER): Payer: Self-pay

## 2024-05-05 ENCOUNTER — Encounter: Payer: Self-pay | Admitting: Family Medicine

## 2024-05-05 ENCOUNTER — Ambulatory Visit: Admitting: Family Medicine

## 2024-05-05 VITALS — BP 132/76 | HR 73 | Temp 98.5°F | Resp 14 | Ht 64.0 in | Wt 164.0 lb

## 2024-05-05 DIAGNOSIS — R221 Localized swelling, mass and lump, neck: Secondary | ICD-10-CM | POA: Diagnosis not present

## 2024-05-05 LAB — ANTINUCLEAR ANTIBODIES, IFA: ANA Ab, IFA: NEGATIVE

## 2024-05-05 NOTE — Patient Instructions (Signed)
 Area on the back of your neck appears to be the same size as when we looked at it in February.  I do suspect that is a lipoma or a benign lesion.  I will order an ultrasound to see if they are able to image that area for further reassurance.  I am sorry to hear about the stressors in the family right now and your brother.  Please reach out to us  if there are any resources needed or if we can help during this difficult time.  Hang in there!

## 2024-05-05 NOTE — Progress Notes (Signed)
 Subjective:  Patient ID: Justin Farley, male    DOB: July 01, 1960  Age: 63 y.o. MRN: [id number]  CC:  Chief Complaint  Patient presents with   Acute Visit    Knot on the back of neck. Right side. Unknown how long. No pain. Has not grown in size    HPI [name] presents for   Knot on neck  We discussed an area on his posterior neck at February visit.  Sister had noted area for a few months.  No bleeding or discharge and no recent change in size at that time.  Approximately 2 cm lesion just under the skin at that time, suspected lipoma.  Recommended discussion with dermatology but suspected benign area.   Today - notes same area. Not sure it had been discussed prior. Saw dermatologist few months ago for area on chest  - told was benign fatty tissue. Did not have eval of neck.    Area on neck seems to be same size. No pain.   Brother is in hospice care for cancer in face, head. Mom passed away in 08-13-2023.  Sisters in town has been helpful. Has support system. Denies any needs at this time. Denies depression or need for counseling at this time. Offer given.    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   Elevated PSA 08/25/2022   Internal hemorrhoids with grade 2 prolapse 11/28/2014   HTN (hypertension) 04/19/2013   GERD (gastroesophageal reflux disease) 04/19/2013   Past Medical History:  Diagnosis Date   Allergy    Chronic leukopenia 11/09/2023   Colon polyps    Esophagitis    Gastritis    GERD (gastroesophageal reflux disease)    Hypertension    Internal hemorrhoids with grade 2 prolapse 11/28/2014   Past Surgical History:  Procedure Laterality Date   COLONOSCOPY     ESOPHAGOGASTRODUODENOSCOPY     HEMORRHOID BANDING     WISDOM TOOTH  EXTRACTION  2009   Allergies  Allergen Reactions   Lamisil  [Terbinafine ]     Sharp stomach pain   Shellfish Allergy Swelling   Prior to Admission medications   Medication Sig Start Date End Date Taking? Authorizing Provider  albuterol  (VENTOLIN  HFA) 108 (90 Base) MCG/ACT inhaler Inhale 2 puffs into the lungs every 4 (four) hours as needed for wheezing. 10/03/19  Yes Levora Reyes SAUNDERS, MD  cyanocobalamin  (VITAMIN B12) 1000 MCG tablet Take 1,000 mcg by mouth daily.   Yes [provider]  famotidine  (PEPCID ) 20 MG tablet TAKE 1 TABLET (20 MG TOTAL) BY MOUTH 2 (TWO) TIMES DAILY AS NEEDED FOR HEARTBURN OR INDIGESTION. 05/04/24  Yes Anice Riis, DO  hydrochlorothiazide  (MICROZIDE ) 12.5 MG capsule TAKE 1 CAPSULE BY MOUTH EVERY DAY IN THE MORNING 09/07/23  Yes Levora Reyes SAUNDERS, MD  omeprazole  (PRILOSEC) 40 MG capsule Take 1 capsule (40 mg total) by mouth 2 (two) times daily before a meal. TAKE 1 CAPSULE (40 MG TOTAL) BY MOUTH DAILY.as needed 03/03/24  Yes Avram Lupita BRAVO, MD  sildenafil  (VIAGRA ) 100 MG tablet TAKE 0.5-1 TABLETS BY MOUTH DAILY AS NEEDED FOR ERECTILE DYSFUNCTION. 03/09/24  Yes Levora Reyes SAUNDERS, MD  amoxicillin (AMOXIL) 500 MG capsule Take 500 mg by mouth 3 (three) times daily. Patient not taking: Reported on 05/05/2024 03/03/24  [provider]   Social History   Socioeconomic History   Marital status: Married    Spouse name: Not on file   Number of children: 4   Years of education: Not on file   Highest education level: Not on file  Occupational History   Occupation: Estate Agent   Occupation: supervisor    Comment: [employer]  Tobacco Use   Smoking status: Never   Smokeless tobacco: Never  Vaping Use   Vaping status: Never Used  Substance and Sexual Activity   Alcohol use: Yes    Comment: special occasions   Drug use: No   Sexual activity: Yes    Birth control/protection: Condom  Other Topics Concern   Not on file  Social History Narrative    Married-2 sons 2 daughters   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 Education: [employer]. Exercise: 3 days a week for 2 hours.   Social Drivers of Corporate Investment Banker Strain: Not on file  Food Insecurity: No Food Insecurity (11/09/2023)   Hunger Vital Sign    Worried About Running Out of Food in the Last Year: Never true    Ran Out of Food in the Last Year: Never true  Transportation Needs: No Transportation Needs (11/09/2023)   PRAPARE - Administrator, Civil Service (Medical): No    Lack of Transportation (Non-Medical): No  Physical Activity: Sufficiently Active (01/08/2024)   Exercise Vital Sign    Days of Exercise per Week: 4 days    Minutes of Exercise per Session: 90 min  Stress: No Stress Concern Present (01/08/2024)   [employer] of Occupational Health - Occupational Stress Questionnaire    Feeling of Stress: Not at all  Social Connections: Unknown (01/08/2024)   Social Connection and Isolation Panel    Frequency of Communication with Friends and Family: Patient declined    Frequency of Social Gatherings with Friends and Family: Patient declined    Attends Religious Services: Patient declined    Database Administrator or Organizations: Patient declined    Attends Banker Meetings: Not on file    Marital Status: Patient declined  Intimate Partner Violence: Not At Risk (11/09/2023)   Humiliation, Afraid, Rape, and Kick questionnaire    Fear of Current or Ex-Partner: No    Emotionally Abused: No    Physically Abused: No    Sexually Abused: No    Review of Systems   Objective:   Vitals:   05/05/24 1311  BP: 132/76  Pulse: 73  Resp: 14  Temp: 98.5 F (36.9 C)  TempSrc: Temporal  SpO2: 99%  Weight: 164 lb (74.4 kg)  Height: 5' 4 (1.626 m)     Physical Exam Constitutional:      General: He is not in acute distress.    Appearance:  Normal appearance. He is well-developed.  HENT:     Head: Normocephalic and atraumatic.  Neck:   Cardiovascular:     Rate and Rhythm: Normal rate.  Pulmonary:     Effort: Pulmonary effort is normal.  Musculoskeletal:     Cervical back: No rigidity or tenderness.  Lymphadenopathy:     Cervical: No cervical adenopathy.  Neurological:     Mental Status: He is alert and oriented to person, place, and time.  Psychiatric:        Mood and  Affect: Mood normal.        Assessment & Plan:  JADIS MIKA is a 63 y.o. male . 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  Reportedly area on chest was evaluated by dermatology, thought to also be a lipoma with recommended intervention.  Will check ultrasound to verify but significant changes okay to continue monitoring.  Difficult situation with his brother self as above.  Denies any resources needed at this time but advised to reach out to me if we can be of any assistance during this difficult time.  No orders of the defined types were placed in this encounter.  Patient Instructions  Area on the back of your neck appears to be the same size as when we looked at it in February.  I do suspect that is a lipoma or a benign lesion.  I will order an ultrasound to see if they are able to image that area for further reassurance.  I am sorry to hear about the stressors in the family right now and your brother.  Please reach out to us  if there are any resources needed or if we can help during this difficult time.  Hang in there!    Signed,   Reyes Pines, MD [facility], [facility] 05/05/24 1:52 PM

## 2024-05-10 ENCOUNTER — Encounter: Payer: Self-pay | Admitting: Hematology and Oncology

## 2024-05-10 ENCOUNTER — Inpatient Hospital Stay: Admitting: Hematology and Oncology

## 2024-05-10 VITALS — BP 147/99 | HR 66 | Temp 97.6°F | Resp 18 | Ht 64.0 in | Wt 166.0 lb

## 2024-05-10 DIAGNOSIS — D72819 Decreased white blood cell count, unspecified: Secondary | ICD-10-CM | POA: Diagnosis not present

## 2024-05-10 DIAGNOSIS — R7989 Other specified abnormal findings of blood chemistry: Secondary | ICD-10-CM

## 2024-05-10 NOTE — Assessment & Plan Note (Addendum)
 The cause is unknown He denies taking oral iron supplement I recommend the patient to donate blood periodically once every 3 months He will get his ferritin rechecked next year with his primary care doctor

## 2024-05-10 NOTE — Assessment & Plan Note (Addendum)
 The most likely cause of his chronic intermittent leukopenia is his African-American heritage All his other tests are unremarkable and overall his leukopenia is consistent with African-American heritage He does not need long-term follow-up

## 2024-05-10 NOTE — Progress Notes (Signed)
 [facility] OFFICE PROGRESS NOTE  Patient Care Team: Levora Reyes SAUNDERS, MD as PCP - General (Family Medicine)  Assessment & Plan Chronic leukopenia The most likely cause of his chronic intermittent leukopenia is his African-American heritage All his other tests are unremarkable and overall his leukopenia is consistent with African-American heritage He does not need long-term follow-up Elevated ferritin level The cause is unknown He denies taking oral iron supplement I recommend the patient to donate blood periodically once every 3 months He will get his ferritin rechecked next year with his primary care doctor  No orders of the defined types were placed in this encounter.    Almarie Bedford, MD  INTERVAL HISTORY: he returns for surveillance follow-up for history of leukopenia and elevated ferritin He is doing well and not symptomatic We discussed test results  PHYSICAL EXAMINATION: ECOG PERFORMANCE STATUS: 0 - Asymptomatic  Vitals:   05/10/24 0927  BP: (!) 147/99  Pulse: 66  Resp: 18  Temp: 97.6 F (36.4 C)  SpO2: 99%   Filed Weights   05/10/24 0927  Weight: 166 lb (75.3 kg)    Relevant data reviewed during this visit included CBC, ferritin, ANA result

## 2024-05-11 ENCOUNTER — Ambulatory Visit: Admitting: Family Medicine

## 2024-05-11 ENCOUNTER — Ambulatory Visit: Payer: Self-pay | Admitting: Family Medicine

## 2024-05-11 ENCOUNTER — Encounter: Payer: Self-pay | Admitting: Family Medicine

## 2024-05-11 VITALS — BP 122/76 | HR 81 | Temp 98.4°F | Resp 14 | Ht 64.0 in | Wt 162.0 lb

## 2024-05-11 DIAGNOSIS — R0981 Nasal congestion: Secondary | ICD-10-CM

## 2024-05-11 DIAGNOSIS — R519 Headache, unspecified: Secondary | ICD-10-CM

## 2024-05-11 LAB — SEDIMENTATION RATE: Sed Rate: 1 mm/h (ref 0–20)

## 2024-05-11 MED ORDER — FLUTICASONE PROPIONATE 50 MCG/ACT NA SUSP: 1.0000 | Freq: Every day | NASAL | 6 refills | Status: AC

## 2024-05-11 NOTE — Patient Instructions (Signed)
 I am reassured by your exam today.  I will check a sed rate test that can look for a rare type of temporal headache.  Since you are having some nasal congestion in the morning, can try Flonase nasal spray at bedtime to see if that might be contributing to headache.  See other information below.  As we discussed there are multiple possible contributors to headaches, including stress, sleep changes, dehydration or not enough fluid can also be a factor but less likely.  I do not see a reason for imaging at this time, but keep me posted in the next week or so and if headaches are not resolving we can do further testing.  If any new or worsening symptoms in the interim please be seen.  Hang in there.   General Headache Without Cause A headache is pain or discomfort felt around the head or neck area. There are many causes and types of headaches. A few common types include: Tension headaches. Migraine headaches. Cluster headaches. Chronic daily headaches. Sometimes, the specific cause of a headache may not be found. Follow these instructions at home: Watch your condition for any changes. Let your health care provider know about them. Take these steps to help with your condition: Managing pain     Take over-the-counter and prescription medicines only as told by your health care provider. Treatment may include medicines for pain that are taken by mouth or applied to the skin. Lie down in a dark, quiet room when you have a headache. Keep lights dim if bright lights bother you or make your headaches worse. If directed, put ice on your head and neck area: Put ice in a plastic bag. Place a towel between your skin and the bag. Leave the ice on for 20 minutes, 2-3 times per day. Remove the ice if your skin turns bright red. This is very important. If you cannot feel pain, heat, or cold, you have a greater risk of damage to the area. If directed, apply heat to the affected area. Use the heat source that your  health care provider recommends, such as a moist heat pack or a heating pad. Place a towel between your skin and the heat source. Leave the heat on for 20-30 minutes. Remove the heat if your skin turns bright red. This is especially important if you are unable to feel pain, heat, or cold. You have a greater risk of getting burned. Eating and drinking Eat meals on a regular schedule. If you drink alcohol: Limit how much you have to: 0-1 drink a day for women who are not pregnant. 0-2 drinks a day for men. Know how much alcohol is in a drink. In the [place] one drink equals one 12 oz bottle of beer (355 mL), one 5 oz glass of wine (148 mL), or one 1 oz glass of hard liquor (44 mL). Stop drinking caffeine, or decrease the amount of caffeine you drink. Drink enough fluid to keep your urine pale yellow. General instructions  Keep a headache journal to help find out what may trigger your headaches. For example, write down: What you eat and drink. How much sleep you get. Any change to your diet or medicines. Try massage or other relaxation techniques. Limit stress. Sit up straight, and do not tense your muscles. Do not use any products that contain nicotine or tobacco. These products include cigarettes, chewing tobacco, and vaping devices, such as e-cigarettes. If you need help quitting, ask your health care provider. Exercise  regularly as told by your health care provider. Sleep on a regular schedule. Get 7-9 hours of sleep each night, or the amount recommended by your health care provider. Keep all follow-up visits. This is important. Contact a health care provider if: Medicine does not help your symptoms. You have a headache that is different from your usual headache. You have nausea or you vomit. You have a fever. Get help right away if: Your headache: Becomes severe quickly. Gets worse after moderate to intense physical activity. You have any of these symptoms: Repeated  vomiting. Pain or stiffness in your neck. Changes to your vision. Pain in an eye or ear. Problems with speech. Muscular weakness or loss of muscle control. Loss of balance or coordination. You feel faint or pass out. You have confusion. You have a seizure. These symptoms may represent a serious problem that is an emergency. Do not wait to see if the symptoms will go away. Get medical help right away. Call your local emergency services (911 in the [place] Do not drive yourself to the hospital. Summary A headache is pain or discomfort felt around the head or neck area. There are many causes and types of headaches. In some cases, the cause may not be found. Keep a headache journal to help find out what may trigger your headaches. Watch your condition for any changes. Let your health care provider know about them. Contact a health care provider if you have a headache that is different from the usual headache, or if your symptoms are not helped by medicine. Get help right away if your headache becomes severe, you vomit, you have a loss of vision, you lose your balance, or you have a seizure. This information is not intended to replace advice given to you by your health care provider. Make sure you discuss any questions you have with your health care provider. Document Revised: 10/17/2020 Document Reviewed: 10/17/2020 Elsevier Patient Education  2024 [employer].

## 2024-05-11 NOTE — Progress Notes (Signed)
 Subjective:  Patient ID: Justin Farley, male    DOB: [birth year]  Age: 63 y.o. MRN: [id number]  CC:  Chief Complaint  Patient presents with   Headache    Sx started a week ago. Comes and goes. Dull. Temples and top of head. Nothing makes it worse. Taken tylenol  and ibuprofen.     HPI Justin Farley presents for   Headache Bilateral temporal and top of head past 1 week to 10 days.  Initially frontal HA. Some sinus congestion in the morning, improves during day. Treated with advil sinus on occasion. Tylenol  for HA 2 times. Not limiting activities. No nighttime wakening. No fever, no recent illness, no vision changes. No jaw pain.  No hx of migraine. Wears contact lenses. Due for eye exam in January.  HA varies during day - improves with tylenol  then returns. About the same past week.  Sleeping ok overall, some stress as discussed last visit with his brother's health- in hospice care, brother now in [facility] with family, had been taking care of him until yesterday, also concerned about HA.  No weakness. Rare feeling of dizziness. Cool sensation of head with pressure in temples.  No head injury.  No n/v.       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   Elevated PSA 08/25/2022   Internal hemorrhoids with grade 2 prolapse 11/28/2014   HTN (hypertension) 04/19/2013   GERD (gastroesophageal reflux disease) 04/19/2013   Past Medical History:  Diagnosis Date   Allergy    Chronic leukopenia 11/09/2023   Colon polyps    Esophagitis    Gastritis    GERD (gastroesophageal reflux disease)    Hypertension    Internal hemorrhoids with grade 2 prolapse 11/28/2014   Past Surgical History:  Procedure Laterality Date   COLONOSCOPY     ESOPHAGOGASTRODUODENOSCOPY      HEMORRHOID BANDING     WISDOM TOOTH EXTRACTION  2009   Allergies  Allergen Reactions   Lamisil  [Terbinafine ]     Sharp stomach pain   Shellfish Allergy Swelling   Prior to Admission medications   Medication Sig Start Date End Date Taking? Authorizing Provider  albuterol  (VENTOLIN  HFA) 108 (90 Base) MCG/ACT inhaler Inhale 2 puffs into the lungs every 4 (four) hours as needed for wheezing. 10/03/19  Yes Levora Reyes SAUNDERS, MD  cyanocobalamin  (VITAMIN B12) 1000 MCG tablet Take 1,000 mcg by mouth daily.   Yes [provider]  famotidine  (PEPCID ) 20 MG tablet TAKE 1 TABLET (20 MG TOTAL) BY MOUTH 2 (TWO) TIMES DAILY AS NEEDED FOR HEARTBURN OR INDIGESTION. 05/04/24  Yes Anice Riis, DO  hydrochlorothiazide  (MICROZIDE ) 12.5 MG capsule TAKE 1 CAPSULE BY MOUTH EVERY DAY IN THE MORNING 09/07/23  Yes Levora Reyes SAUNDERS, MD  omeprazole  (PRILOSEC) 40 MG capsule Take 1 capsule (40 mg total) by mouth 2 (two) times daily before a meal. TAKE 1 CAPSULE (40 MG TOTAL) BY MOUTH DAILY.as needed 03/03/24  Yes Avram Lupita BRAVO, MD  sildenafil  (VIAGRA ) 100 MG tablet TAKE 0.5-1 TABLETS BY MOUTH DAILY AS NEEDED FOR ERECTILE DYSFUNCTION. 03/09/24  Yes Levora Reyes SAUNDERS, MD  amoxicillin (AMOXIL) 500 MG capsule Take 500 mg by mouth 3 (three) times daily. Patient not taking: Reported on 05/11/2024 03/03/24   [provider]  Social History   Socioeconomic History   Marital status: Married    Spouse name: Not on file   Number of children: 4   Years of education: Not on file   Highest education level: Not on file  Occupational History   Occupation: Estate Agent   Occupation: merchandiser, retail    Comment: [employer]  Tobacco Use   Smoking status: Never   Smokeless tobacco: Never  Vaping Use   Vaping status: Never Used  Substance and Sexual Activity   Alcohol use: Yes    Comment: special occasions   Drug use: No   Sexual activity: Yes    Birth control/protection: Condom  Other Topics Concern    Not on file  Social History Narrative   Married-2 sons 2 daughters   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 Education: [employer]. Exercise: 3 days a week for 2 hours.   Social Drivers of Corporate Investment Banker Strain: Not on file  Food Insecurity: No Food Insecurity (11/09/2023)   Hunger Vital Sign    Worried About Running Out of Food in the Last Year: Never true    Ran Out of Food in the Last Year: Never true  Transportation Needs: No Transportation Needs (11/09/2023)   PRAPARE - Administrator, Civil Service (Medical): No    Lack of Transportation (Non-Medical): No  Physical Activity: Sufficiently Active (01/08/2024)   Exercise Vital Sign    Days of Exercise per Week: 4 days    Minutes of Exercise per Session: 90 min  Stress: No Stress Concern Present (01/08/2024)   [employer] of Occupational Health - Occupational Stress Questionnaire    Feeling of Stress: Not at all  Social Connections: Unknown (01/08/2024)   Social Connection and Isolation Panel    Frequency of Communication with Friends and Family: Patient declined    Frequency of Social Gatherings with Friends and Family: Patient declined    Attends Religious Services: Patient declined    Database Administrator or Organizations: Patient declined    Attends Banker Meetings: Not on file    Marital Status: Patient declined  Intimate Partner Violence: Not At Risk (11/09/2023)   Humiliation, Afraid, Rape, and Kick questionnaire    Fear of Current or Ex-Partner: No    Emotionally Abused: No    Physically Abused: No    Sexually Abused: No    Review of Systems   Objective:   Vitals:   05/11/24 0844  BP: 122/76  Pulse: 81  Resp: 14  Temp: 98.4 F (36.9 C)  TempSrc: Temporal  SpO2: 98%  Weight: 162 lb (73.5 kg)  Height: 5' 4 (1.626 m)     Physical Exam Constitutional:      General:  He is not in acute distress.    Appearance: Normal appearance. He is well-developed.  HENT:     Head: Normocephalic and atraumatic.     Comments: Reports area of discomfort at temples but no cords, no discomfort with palpation over temporal scalp.  No rash.    Nose:     Comments: Slight edema of turbinates.  No active discharge.  Sinuses nontender, specifically frontal, maxillary sinuses nontender. Cardiovascular:     Rate and Rhythm: Normal rate.  Pulmonary:     Effort: Pulmonary effort is normal.  Neurological:     General: No focal deficit present.  Mental Status: He is alert and oriented to person, place, and time.     GCS: GCS eye subscore is 4. GCS verbal subscore is 5. GCS motor subscore is 6.     Cranial Nerves: No cranial nerve deficit, dysarthria or facial asymmetry.     Sensory: No sensory deficit.     Motor: Motor function is intact. No pronator drift.     Coordination: Coordination is intact. Romberg sign negative. Coordination normal. Heel to [facility] Test normal. Rapid alternating movements normal.     Gait: Gait is intact.  Psychiatric:        Mood and Affect: Mood normal.     Assessment & Plan:  SANTEZ WOODCOX is a 63 y.o. male . Temporal headache - Plan: Sedimentation rate  Nasal congestion - Plan: fluticasone (FLONASE) 50 MCG/ACT nasal spray  Bitemporal headache, with headache that radiates to dorsal scalp.  No rash.  No concerning findings on exam or concerning history/red flags.  Does have situational stressor with his brothers diagnosis, hospice care, and had been caring for him up until yesterday, now [place] .  This potentially could be a contributor.  Nasal congestion in the morning, possible allergy component, trial of Flonase.  With headache at temples will check a sed rate but less likely giant cell arteritis.  Denies vision changes or jaw claudication symptoms.  Handout given on general headache, close monitoring with RTC precautions given.  Hold on  imaging at this time.  Consider if persistent or new symptoms.   Meds ordered this encounter  Medications   fluticasone (FLONASE) 50 MCG/ACT nasal spray    Sig: Place 1-2 sprays into both nostrils daily.    Dispense:  16 g    Refill:  6   Patient Instructions  I am reassured by your exam today.  I will check a sed rate test that can look for a rare type of temporal headache.  Since you are having some nasal congestion in the morning, can try Flonase nasal spray at bedtime to see if that might be contributing to headache.  See other information below.  As we discussed there are multiple possible contributors to headaches, including stress, sleep changes, dehydration or not enough fluid can also be a factor but less likely.  I do not see a reason for imaging at this time, but keep me posted in the next week or so and if headaches are not resolving we can do further testing.  If any new or worsening symptoms in the interim please be seen.  Hang in there.   General Headache Without Cause A headache is pain or discomfort felt around the head or neck area. There are many causes and types of headaches. A few common types include: Tension headaches. Migraine headaches. Cluster headaches. Chronic daily headaches. Sometimes, the specific cause of a headache may not be found. Follow these instructions at home: Watch your condition for any changes. Let your health care provider know about them. Take these steps to help with your condition: Managing pain     Take over-the-counter and prescription medicines only as told by your health care provider. Treatment may include medicines for pain that are taken by mouth or applied to the skin. Lie down in a dark, quiet room when you have a headache. Keep lights dim if bright lights bother you or make your headaches worse. If directed, put ice on your head and neck area: Put ice in a plastic bag. Place a towel between your  skin and the bag. Leave the ice  on for 20 minutes, 2-3 times per day. Remove the ice if your skin turns bright red. This is very important. If you cannot feel pain, heat, or cold, you have a greater risk of damage to the area. If directed, apply heat to the affected area. Use the heat source that your health care provider recommends, such as a moist heat pack or a heating pad. Place a towel between your skin and the heat source. Leave the heat on for 20-30 minutes. Remove the heat if your skin turns bright red. This is especially important if you are unable to feel pain, heat, or cold. You have a greater risk of getting burned. Eating and drinking Eat meals on a regular schedule. If you drink alcohol: Limit how much you have to: 0-1 drink a day for women who are not pregnant. 0-2 drinks a day for men. Know how much alcohol is in a drink. In the [place] one drink equals one 12 oz bottle of beer (355 mL), one 5 oz glass of wine (148 mL), or one 1 oz glass of hard liquor (44 mL). Stop drinking caffeine, or decrease the amount of caffeine you drink. Drink enough fluid to keep your urine pale yellow. General instructions  Keep a headache journal to help find out what may trigger your headaches. For example, write down: What you eat and drink. How much sleep you get. Any change to your diet or medicines. Try massage or other relaxation techniques. Limit stress. Sit up straight, and do not tense your muscles. Do not use any products that contain nicotine or tobacco. These products include cigarettes, chewing tobacco, and vaping devices, such as e-cigarettes. If you need help quitting, ask your health care provider. Exercise regularly as told by your health care provider. Sleep on a regular schedule. Get 7-9 hours of sleep each night, or the amount recommended by your health care provider. Keep all follow-up visits. This is important. Contact a health care provider if: Medicine does not help your symptoms. You have a headache  that is different from your usual headache. You have nausea or you vomit. You have a fever. Get help right away if: Your headache: Becomes severe quickly. Gets worse after moderate to intense physical activity. You have any of these symptoms: Repeated vomiting. Pain or stiffness in your neck. Changes to your vision. Pain in an eye or ear. Problems with speech. Muscular weakness or loss of muscle control. Loss of balance or coordination. You feel faint or pass out. You have confusion. You have a seizure. These symptoms may represent a serious problem that is an emergency. Do not wait to see if the symptoms will go away. Get medical help right away. Call your local emergency services (911 in the [place] Do not drive yourself to the hospital. Summary A headache is pain or discomfort felt around the head or neck area. There are many causes and types of headaches. In some cases, the cause may not be found. Keep a headache journal to help find out what may trigger your headaches. Watch your condition for any changes. Let your health care provider know about them. Contact a health care provider if you have a headache that is different from the usual headache, or if your symptoms are not helped by medicine. Get help right away if your headache becomes severe, you vomit, you have a loss of vision, you lose your balance, or you have a seizure. This information  is not intended to replace advice given to you by your health care provider. Make sure you discuss any questions you have with your health care provider. Document Revised: 10/17/2020 Document Reviewed: 10/17/2020 Elsevier Patient Education  2024 [employer] Inc.      Signed,   Reyes Pines, MD [facility], [facility] 05/11/24 9:17 AM

## 2024-05-16 ENCOUNTER — Inpatient Hospital Stay: Admission: RE | Admit: 2024-05-16 | Discharge: 2024-05-16 | Attending: Family Medicine | Admitting: Family Medicine

## 2024-05-16 DIAGNOSIS — R221 Localized swelling, mass and lump, neck: Secondary | ICD-10-CM

## 2024-05-20 ENCOUNTER — Ambulatory Visit: Payer: Self-pay | Admitting: Family Medicine

## 2024-05-20 NOTE — Telephone Encounter (Signed)
 Patient is questioning if this Is a serious health issue?

## 2024-05-24 NOTE — Telephone Encounter (Signed)
 FYI

## 2024-07-12 ENCOUNTER — Ambulatory Visit (INDEPENDENT_AMBULATORY_CARE_PROVIDER_SITE_OTHER)

## 2024-07-20 ENCOUNTER — Ambulatory Visit: Admitting: Urology

## 2024-07-27 ENCOUNTER — Ambulatory Visit (INDEPENDENT_AMBULATORY_CARE_PROVIDER_SITE_OTHER)

## 2024-08-01 ENCOUNTER — Ambulatory Visit: Admitting: Urology

## 2024-09-14 ENCOUNTER — Encounter: Admitting: Family Medicine

## 2024-11-03 ENCOUNTER — Ambulatory Visit: Admitting: Family Medicine
